# Patient Record
Sex: Male | Born: 1950 | Race: Black or African American | Hispanic: No | Smoking: Never smoker
Health system: Southern US, Community
[De-identification: ages and names within clinical notes are randomized; demographics above are authoritative.]

## PROBLEM LIST (undated history)

## (undated) DIAGNOSIS — E119 Type 2 diabetes mellitus without complications: Secondary | ICD-10-CM

## (undated) DIAGNOSIS — K56609 Unspecified intestinal obstruction, unspecified as to partial versus complete obstruction: Secondary | ICD-10-CM

## (undated) DIAGNOSIS — E785 Hyperlipidemia, unspecified: Secondary | ICD-10-CM

## (undated) DIAGNOSIS — I10 Essential (primary) hypertension: Secondary | ICD-10-CM

## (undated) DIAGNOSIS — N189 Chronic kidney disease, unspecified: Secondary | ICD-10-CM

## (undated) DIAGNOSIS — M069 Rheumatoid arthritis, unspecified: Secondary | ICD-10-CM

## (undated) DIAGNOSIS — K5790 Diverticulosis of intestine, part unspecified, without perforation or abscess without bleeding: Secondary | ICD-10-CM

## (undated) HISTORY — DX: Hyperlipidemia, unspecified: E78.5

## (undated) HISTORY — PX: LEFT COLECTOMY: SHX856

## (undated) HISTORY — DX: Type 2 diabetes mellitus without complications: E11.9

## (undated) HISTORY — DX: Chronic kidney disease, unspecified: N18.9

## (undated) HISTORY — PX: APPENDECTOMY: SHX54

## (undated) HISTORY — PX: COLOSTOMY TAKEDOWN: SHX5783

## (undated) HISTORY — PX: LYSIS OF ADHESION: SHX5961

## (undated) HISTORY — PX: KNEE SURGERY: SHX244

---

## 2010-05-21 ENCOUNTER — Inpatient Hospital Stay (HOSPITAL_COMMUNITY)
Admission: EM | Admit: 2010-05-21 | Discharge: 2010-05-26 | Payer: Self-pay | Source: Home / Self Care | Attending: General Surgery | Admitting: General Surgery

## 2010-05-23 LAB — CBC
HCT: 28.3 % — ABNORMAL LOW (ref 39.0–52.0)
Hemoglobin: 9.7 g/dL — ABNORMAL LOW (ref 13.0–17.0)
MCH: 28.4 pg (ref 26.0–34.0)
MCHC: 34.3 g/dL (ref 30.0–36.0)
MCV: 82.7 fL (ref 78.0–100.0)
Platelets: 192 10*3/uL (ref 150–400)
RBC: 3.42 MIL/uL — ABNORMAL LOW (ref 4.22–5.81)
RDW: 15.4 % (ref 11.5–15.5)
WBC: 13 10*3/uL — ABNORMAL HIGH (ref 4.0–10.5)

## 2010-05-23 LAB — BASIC METABOLIC PANEL
BUN: 10 mg/dL (ref 6–23)
CO2: 22 mEq/L (ref 19–32)
Calcium: 7.6 mg/dL — ABNORMAL LOW (ref 8.4–10.5)
Chloride: 112 mEq/L (ref 96–112)
Creatinine, Ser: 1.14 mg/dL (ref 0.4–1.5)
GFR calc Af Amer: >60 mL/min (ref >60)
GFR calc non Af Amer: >60 mL/min (ref >60)
Glucose, Bld: 79 mg/dL (ref 70–99)
Potassium: 3.7 mEq/L (ref 3.5–5.1)
Sodium: 141 mEq/L (ref 135–145)

## 2010-05-23 LAB — ABO/RH: ABO/RH(D): O POS

## 2010-05-24 LAB — DIFFERENTIAL
Basophils Absolute: 0 10*3/uL (ref 0.0–0.1)
Basophils Absolute: 0 10*3/uL (ref 0.0–0.1)
Basophils Relative: 0 % (ref 0–1)
Basophils Relative: 0 % (ref 0–1)
Eosinophils Absolute: 0.3 10*3/uL (ref 0.0–0.7)
Eosinophils Absolute: 0.4 10*3/uL (ref 0.0–0.7)
Eosinophils Relative: 3 % (ref 0–5)
Eosinophils Relative: 3 % (ref 0–5)
Lymphocytes Relative: 25 % (ref 12–46)
Lymphocytes Relative: 28 % (ref 12–46)
Lymphs Abs: 3.2 10*3/uL (ref 0.7–4.0)
Lymphs Abs: 3.1 10*3/uL (ref 0.7–4.0)
Monocytes Absolute: 0.8 10*3/uL (ref 0.1–1.0)
Monocytes Absolute: 0.7 10*3/uL (ref 0.1–1.0)
Monocytes Relative: 6 % (ref 3–12)
Monocytes Relative: 6 % (ref 3–12)
Neutro Abs: 8.7 10*3/uL — ABNORMAL HIGH (ref 1.7–7.7)
Neutro Abs: 7.2 10*3/uL (ref 1.7–7.7)
Neutrophils Relative %: 67 % (ref 43–77)
Neutrophils Relative %: 63 % (ref 43–77)

## 2010-05-24 LAB — BASIC METABOLIC PANEL
BUN: 4 mg/dL — ABNORMAL LOW (ref 6–23)
CO2: 23 mEq/L (ref 19–32)
Calcium: 8 mg/dL — ABNORMAL LOW (ref 8.4–10.5)
Chloride: 110 mEq/L (ref 96–112)
Creatinine, Ser: 1.19 mg/dL (ref 0.4–1.5)
GFR calc Af Amer: >60 mL/min (ref >60)
GFR calc non Af Amer: >60 mL/min (ref >60)
Glucose, Bld: 80 mg/dL (ref 70–99)
Potassium: 3.7 mEq/L (ref 3.5–5.1)
Sodium: 139 mEq/L (ref 135–145)

## 2010-05-24 LAB — CBC
HCT: 27 % — ABNORMAL LOW (ref 39.0–52.0)
Hemoglobin: 9.3 g/dL — ABNORMAL LOW (ref 13.0–17.0)
MCH: 28.4 pg (ref 26.0–34.0)
MCHC: 34.4 g/dL (ref 30.0–36.0)
MCV: 82.6 fL (ref 78.0–100.0)
Platelets: 184 10*3/uL (ref 150–400)
RBC: 3.27 MIL/uL — ABNORMAL LOW (ref 4.22–5.81)
RDW: 14.8 % (ref 11.5–15.5)
WBC: 11.4 10*3/uL — ABNORMAL HIGH (ref 4.0–10.5)

## 2010-06-04 LAB — CROSSMATCH
ABO/RH(D): O POS
Antibody Screen: NEGATIVE
Unit division: 0
Unit division: 0

## 2010-06-25 NOTE — Discharge Summary (Signed)
  [name], Kevin Lopez NO.:  [id number]  MEDICAL RECORD NO.:  [id number]          PATIENT TYPE:  INP  LOCATION:  A335                          FACILITY:  [facility]  PHYSICIAN:  Barbaraann Barthel, M.D. DATE OF BIRTH:  04-Jul-1950  DATE OF ADMISSION:  05/21/2010 DATE OF DISCHARGE:  01/07/2012LH                              DISCHARGE SUMMARY   DIAGNOSES:  Partial small bowel obstruction.  SECONDARY DIAGNOSES: 1. Arthritis. 2. Asthma. 3. Hypertension.  PROCEDURES:  None other than NG tube decompression.  NOTE:  This is a 60 year old black male who had a 24-hour history of crampy abdominal pain prior to is being seen in the emergency room. Abdominal films showed that he had distended loops of bowel and even a very tender and bloated abdomen with no recent bowel movements or passage of flatus.  An NG tube was inserted and essentially over the subsequent days, he began to pass gas and he felt better and clinically what was thought to be a possibly closed loop obstruction or complete obstruction resolved and at the time of discharge, he was tolerating p.o. well including solid foods and passing gas and having multiple bowel movements.  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  He had 1 previous partial bowel obstruction which resolved nonoperatively approximately 3 years ago and he is essentially in the hospital at this time.  He was also found to have hypertension while he was in the hospital.  DISCHARGE INSTRUCTIONS:  He is to follow up with his own physician who is Dr. Logan Bores and rheumatologist up in [place].  He is told to go to the emergency room should he develop any recurrence of these type of symptoms suggesting bowel obstruction.  He is told to increase his activity as  tolerated.  He is permitted to go up and down the stairs. He is permitted to shower and essentially return to his prehospital activity.  His diet is soft diet and full liquids for the present and I will follow up with him in 2 week's time.  DISCHARGE INSTRUCTIONS:  He is to continue his home medications, his Valium, his hydrocodone as needed, Celebrex, prednisone, Remicade and Elavil as per his physicians in [place].  LABORATORY DATA:  As stated, the patient had a CT scan and multiple acute abdominal series which we followed him.  During his hospitalization, he remained stable.  He has some mild anemia which is likely related to his chronic arthritic conditions and the treatment there off.  On January 4, his H and H was 9.7 and 28.3 and his electrolytes were within normal limits.  His BUN was 10 and his creatinine was 1.14.     Barbaraann Barthel, M.D.     WB/MEDQ  D:  05/26/2010  T:  05/27/2010  Job:  [id number]  Electronically Signed by Barbaraann Barthel M.D. on 06/25/2010 11:09:19 AM

## 2010-06-25 NOTE — Consult Note (Signed)
NAMEMarland Kitchen  Kevin Lopez, Kevin Lopez NO.:  [id number]  MEDICAL RECORD NO.:  [id number]          PATIENT TYPE:  INP  LOCATION:  A335                          FACILITY:  [facility]  PHYSICIAN:  Barbaraann Barthel, M.D. DATE OF BIRTH:  04/05/1951  DATE OF CONSULTATION:  05/21/2010 DATE OF DISCHARGE:                                CONSULTATION   NOTE:  Surgery was asked to see this 60 year old black male for approximately a one 24-hour history of crampy abdominal pain.  HISTORY OF PRESENT MEDICAL ILLNESS:  The patient states that last night, the patient developed some crampy abdominal pain that worsened and he was passing gas and having a bowel movement this morning.  However, he has not had a bowel movement since he has been here this evening.  He has passed a little bit of gas, but he came to the emergency room when he was obviously not improving.  The patient has a history of considerable abdominal surgeries.  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 This required an excision and an resection which was complicated by several surgeries including a colostomy at one point.  The patient has followed this carefully and a year and half ago, he had a colonoscopy and he had no signs of any recurrence.  He did, however, 3 years ago, he was admitted to the hospital with partial small-bowel obstruction which resolved without any surgery.  He is admitted now with those same type of symptoms and after having a CT scan with contrast which showed a small-bowel obstruction in the area likely of the mid or distal ileum.  Other medical problems include history of asthma and a history of arthritis.  He has severe arthritis for which he takes several medications including Remicade which he thinks the dose is 750 mg IV every month.  He also takes Celebrex 200 mg b.i.d.  He also takes for pain some hydrocodone with acetaminophen and Valium  10 mg p.o. hour sleep.  He states that he is allergic to LATEX.  Her other medical problems include asthma, arthritis and he has some hypertension noted on his admission now.  He states that this is a new finding may be related to his pain syndromes at present, we will monitor that.  REVIEW OF SYSTEMS:  GI SYSTEM:  No history of hepatitis.  He does have a history of irritable bowel syndrome and it was treated with medications. He does not recall which ones in [place] and when he was having a lot of loose stools.  He has no history of inflammatory bowel disease, however.  CARDIORESPIRATORY SYSTEM:  The patient is a nonsmoker, but he has a history of asthma.  ENDOCRINE SYSTEM:  No history of diabetes or thyroid disease.  GU SYSTEM:  No history of nephrolithiasis.  However, the patient has had some bladder problems in the past, where he has had some outlet obstruction and he had some pollakiuria which was treated in the past.  He is voiding now without those problems and should he need to have this taken care of, I am going to have  a urologist see him as he states that placing a Foley catheter was problematic in the past.  PHYSICAL EXAMINATION:  GENERAL:  Discloses a pleasant, but obviously acutely uncomfortable 60 year old black male. VITAL SIGNS:  He is 5 feet 9 inches, weighs approximately 160 pounds, his last blood pressure is 197/92 and his pulse rate was 97, his respirations were 23 and his O2 sat is 97% on room air. HEAD:  Normocephalic. EYES:  Extraocular movements are intact.  Pupils were round and react to light and accommodation. MOUTH:  The patient has an NG tube placed.  The patient's mouth is moist. NECK:  His neck is supple and cylindrical without any jugular vein distention, thyromegaly or tracheal deviation or cervical adenopathy and no bruits are auscultated. CHEST:  Clear to both anterior and posterior auscultation. HEART:  Regular rhythm. ABDOMEN:  Somewhat  distended.  He has multiple scars and an incisional hernia there.  His other surgeries have included an appendectomy in the past, bilateral inguinal hernias when he was an infant and as stated those several colon related surgeries. RECTAL:  The patient has guaiac-negative stool. EXTREMITIES:  The patient has had a previous right total knee.  He also bears the signs of obvious osteo or arthritic bony and articular deformities.  LABORATORY DATA:  CT scan as mentioned above.  His white count is 14.2 with an H and H of 11.9 and 33.9, platelet counts 240,000.  He has 80% neutrophilia.  His electrolytes are within normal limits.  His creatinine is 1.21 and a BUN of 16.  Sodium is 137 with a potassium of 4.2 and a chloride of 103 and a bicarb of 24.  Liver function studies are all within normal limits.  IMPRESSION:  Therefore, 60 year old black male with a history of colon cancer who had partial small-bowel obstructions in the past who was admitted now with a small bowel obstruction in the present.  PLAN:  Therefore, admit NG suction.  I have given him Rocephin and we will hydrate him and follow him up with acute abdominal series and follow him for any acute surgical changes.  We would also monitor his blood pressure.     Barbaraann Barthel, M.D.     WB/MEDQ  D:  05/21/2010  T:  05/22/2010  Job:  [id number]  Electronically Signed by Barbaraann Barthel M.D. on 06/25/2010 11:09:09 AM

## 2010-07-30 LAB — BASIC METABOLIC PANEL
BUN: 12 mg/dL (ref 6–23)
BUN: 16 mg/dL (ref 6–23)
CO2: 24 mEq/L (ref 19–32)
CO2: 24 mEq/L (ref 19–32)
Calcium: 8.5 mg/dL (ref 8.4–10.5)
Calcium: 9.7 mg/dL (ref 8.4–10.5)
Chloride: 103 mEq/L (ref 96–112)
Chloride: 108 mEq/L (ref 96–112)
Creatinine, Ser: 1.2 mg/dL (ref 0.4–1.5)
Creatinine, Ser: 1.21 mg/dL (ref 0.4–1.5)
GFR calc Af Amer: >60 mL/min (ref >60)
GFR calc Af Amer: >60 mL/min (ref >60)
GFR calc non Af Amer: >60 mL/min (ref >60)
GFR calc non Af Amer: >60 mL/min (ref >60)
Glucose, Bld: 126 mg/dL — ABNORMAL HIGH (ref 70–99)
Glucose, Bld: 169 mg/dL — ABNORMAL HIGH (ref 70–99)
Potassium: 4.1 mEq/L (ref 3.5–5.1)
Potassium: 4.2 mEq/L (ref 3.5–5.1)
Sodium: 137 mEq/L (ref 135–145)
Sodium: 141 mEq/L (ref 135–145)

## 2010-07-30 LAB — LIPASE, BLOOD: Lipase: 28 U/L (ref 11–59)

## 2010-07-30 LAB — URINALYSIS, ROUTINE W REFLEX MICROSCOPIC
Bilirubin Urine: NEGATIVE
Glucose, UA: NEGATIVE mg/dL
Hgb urine dipstick: NEGATIVE
Ketones, ur: NEGATIVE mg/dL
Leukocytes, UA: NEGATIVE
Nitrite: NEGATIVE
Protein, ur: 100 mg/dL — AB
Specific Gravity, Urine: 1.02 (ref 1.005–1.030)
Urobilinogen, UA: 0.2 mg/dL (ref 0.0–1.0)
pH: 8.5 — ABNORMAL HIGH (ref 5.0–8.0)

## 2010-07-30 LAB — CBC
HCT: 33.9 % — ABNORMAL LOW (ref 39.0–52.0)
HCT: 37.7 % — ABNORMAL LOW (ref 39.0–52.0)
Hemoglobin: 11.9 g/dL — ABNORMAL LOW (ref 13.0–17.0)
Hemoglobin: 13 g/dL (ref 13.0–17.0)
MCH: 28.1 pg (ref 26.0–34.0)
MCH: 28.3 pg (ref 26.0–34.0)
MCHC: 34.5 g/dL (ref 30.0–36.0)
MCHC: 35.1 g/dL (ref 30.0–36.0)
MCV: 80.7 fL (ref 78.0–100.0)
MCV: 81.4 fL (ref 78.0–100.0)
Platelets: 210 10*3/uL (ref 150–400)
Platelets: 240 10*3/uL (ref 150–400)
RBC: 4.2 MIL/uL — ABNORMAL LOW (ref 4.22–5.81)
RBC: 4.63 MIL/uL (ref 4.22–5.81)
RDW: 14.8 % (ref 11.5–15.5)
RDW: 15.1 % (ref 11.5–15.5)
WBC: 14.2 10*3/uL — ABNORMAL HIGH (ref 4.0–10.5)
WBC: 17.3 10*3/uL — ABNORMAL HIGH (ref 4.0–10.5)

## 2010-07-30 LAB — DIFFERENTIAL
Basophils Absolute: 0 10*3/uL (ref 0.0–0.1)
Basophils Absolute: 0 10*3/uL (ref 0.0–0.1)
Basophils Relative: 0 % (ref 0–1)
Basophils Relative: 0 % (ref 0–1)
Eosinophils Absolute: 0.1 10*3/uL (ref 0.0–0.7)
Eosinophils Absolute: 0.1 10*3/uL (ref 0.0–0.7)
Eosinophils Relative: 0 % (ref 0–5)
Eosinophils Relative: 1 % (ref 0–5)
Lymphocytes Relative: 15 % (ref 12–46)
Lymphocytes Relative: 18 % (ref 12–46)
Lymphs Abs: 2.1 10*3/uL (ref 0.7–4.0)
Lymphs Abs: 3.1 10*3/uL (ref 0.7–4.0)
Monocytes Absolute: 0.7 10*3/uL (ref 0.1–1.0)
Monocytes Absolute: 1.3 10*3/uL — ABNORMAL HIGH (ref 0.1–1.0)
Monocytes Relative: 5 % (ref 3–12)
Monocytes Relative: 8 % (ref 3–12)
Neutro Abs: 11.3 10*3/uL — ABNORMAL HIGH (ref 1.7–7.7)
Neutro Abs: 12.8 10*3/uL — ABNORMAL HIGH (ref 1.7–7.7)
Neutrophils Relative %: 74 % (ref 43–77)
Neutrophils Relative %: 80 % — ABNORMAL HIGH (ref 43–77)

## 2010-07-30 LAB — HEPATIC FUNCTION PANEL
ALT: 17 U/L (ref 0–53)
AST: 22 U/L (ref 0–37)
Albumin: 4 g/dL (ref 3.5–5.2)
Alkaline Phosphatase: 37 U/L — ABNORMAL LOW (ref 39–117)
Bilirubin, Direct: 0.1 mg/dL (ref 0.0–0.3)
Indirect Bilirubin: 0.3 mg/dL (ref 0.3–0.9)
Total Bilirubin: 0.4 mg/dL (ref 0.3–1.2)
Total Protein: 7.6 g/dL (ref 6.0–8.3)

## 2010-07-30 LAB — URINE MICROSCOPIC-ADD ON

## 2011-05-28 DIAGNOSIS — M069 Rheumatoid arthritis, unspecified: Secondary | ICD-10-CM | POA: Diagnosis not present

## 2011-05-28 DIAGNOSIS — G542 Cervical root disorders, not elsewhere classified: Secondary | ICD-10-CM | POA: Diagnosis not present

## 2011-06-13 DIAGNOSIS — IMO0002 Reserved for concepts with insufficient information to code with codable children: Secondary | ICD-10-CM | POA: Diagnosis not present

## 2011-06-13 DIAGNOSIS — M069 Rheumatoid arthritis, unspecified: Secondary | ICD-10-CM | POA: Diagnosis not present

## 2011-06-13 DIAGNOSIS — M810 Age-related osteoporosis without current pathological fracture: Secondary | ICD-10-CM | POA: Diagnosis not present

## 2011-06-13 DIAGNOSIS — M1710 Unilateral primary osteoarthritis, unspecified knee: Secondary | ICD-10-CM | POA: Diagnosis not present

## 2011-06-13 DIAGNOSIS — E119 Type 2 diabetes mellitus without complications: Secondary | ICD-10-CM | POA: Diagnosis not present

## 2011-06-13 DIAGNOSIS — M130 Polyarthritis, unspecified: Secondary | ICD-10-CM | POA: Diagnosis not present

## 2011-07-15 DIAGNOSIS — M069 Rheumatoid arthritis, unspecified: Secondary | ICD-10-CM | POA: Diagnosis not present

## 2011-07-15 DIAGNOSIS — M130 Polyarthritis, unspecified: Secondary | ICD-10-CM | POA: Diagnosis not present

## 2011-07-15 DIAGNOSIS — M1710 Unilateral primary osteoarthritis, unspecified knee: Secondary | ICD-10-CM | POA: Diagnosis not present

## 2011-08-01 DIAGNOSIS — N318 Other neuromuscular dysfunction of bladder: Secondary | ICD-10-CM | POA: Diagnosis not present

## 2011-08-01 DIAGNOSIS — E291 Testicular hypofunction: Secondary | ICD-10-CM | POA: Diagnosis not present

## 2011-08-01 DIAGNOSIS — N400 Enlarged prostate without lower urinary tract symptoms: Secondary | ICD-10-CM | POA: Diagnosis not present

## 2011-08-01 DIAGNOSIS — N529 Male erectile dysfunction, unspecified: Secondary | ICD-10-CM | POA: Diagnosis not present

## 2011-08-01 DIAGNOSIS — C61 Malignant neoplasm of prostate: Secondary | ICD-10-CM | POA: Diagnosis not present

## 2011-08-20 DIAGNOSIS — M069 Rheumatoid arthritis, unspecified: Secondary | ICD-10-CM | POA: Diagnosis not present

## 2011-08-20 DIAGNOSIS — M130 Polyarthritis, unspecified: Secondary | ICD-10-CM | POA: Diagnosis not present

## 2011-08-20 DIAGNOSIS — M12569 Traumatic arthropathy, unspecified knee: Secondary | ICD-10-CM | POA: Diagnosis not present

## 2011-09-23 DIAGNOSIS — M12569 Traumatic arthropathy, unspecified knee: Secondary | ICD-10-CM | POA: Diagnosis not present

## 2011-09-23 DIAGNOSIS — M069 Rheumatoid arthritis, unspecified: Secondary | ICD-10-CM | POA: Diagnosis not present

## 2011-09-23 DIAGNOSIS — E119 Type 2 diabetes mellitus without complications: Secondary | ICD-10-CM | POA: Diagnosis not present

## 2011-09-23 DIAGNOSIS — M810 Age-related osteoporosis without current pathological fracture: Secondary | ICD-10-CM | POA: Diagnosis not present

## 2011-09-23 DIAGNOSIS — E785 Hyperlipidemia, unspecified: Secondary | ICD-10-CM | POA: Diagnosis not present

## 2011-10-02 DIAGNOSIS — N318 Other neuromuscular dysfunction of bladder: Secondary | ICD-10-CM | POA: Diagnosis not present

## 2011-10-02 DIAGNOSIS — E291 Testicular hypofunction: Secondary | ICD-10-CM | POA: Diagnosis not present

## 2011-10-02 DIAGNOSIS — N529 Male erectile dysfunction, unspecified: Secondary | ICD-10-CM | POA: Diagnosis not present

## 2011-10-02 DIAGNOSIS — N400 Enlarged prostate without lower urinary tract symptoms: Secondary | ICD-10-CM | POA: Diagnosis not present

## 2011-10-10 DIAGNOSIS — H6120 Impacted cerumen, unspecified ear: Secondary | ICD-10-CM | POA: Diagnosis not present

## 2011-10-10 DIAGNOSIS — J329 Chronic sinusitis, unspecified: Secondary | ICD-10-CM | POA: Diagnosis not present

## 2011-10-10 DIAGNOSIS — J3089 Other allergic rhinitis: Secondary | ICD-10-CM | POA: Diagnosis not present

## 2011-10-10 DIAGNOSIS — H908 Mixed conductive and sensorineural hearing loss, unspecified: Secondary | ICD-10-CM | POA: Diagnosis not present

## 2011-10-16 DIAGNOSIS — J329 Chronic sinusitis, unspecified: Secondary | ICD-10-CM | POA: Diagnosis not present

## 2011-10-18 DIAGNOSIS — N318 Other neuromuscular dysfunction of bladder: Secondary | ICD-10-CM | POA: Diagnosis not present

## 2011-10-18 DIAGNOSIS — J3089 Other allergic rhinitis: Secondary | ICD-10-CM | POA: Diagnosis not present

## 2011-10-18 DIAGNOSIS — N3010 Interstitial cystitis (chronic) without hematuria: Secondary | ICD-10-CM | POA: Diagnosis not present

## 2011-10-18 DIAGNOSIS — H608X9 Other otitis externa, unspecified ear: Secondary | ICD-10-CM | POA: Diagnosis not present

## 2011-10-18 DIAGNOSIS — N529 Male erectile dysfunction, unspecified: Secondary | ICD-10-CM | POA: Diagnosis not present

## 2011-10-18 DIAGNOSIS — E291 Testicular hypofunction: Secondary | ICD-10-CM | POA: Diagnosis not present

## 2011-10-18 DIAGNOSIS — H9209 Otalgia, unspecified ear: Secondary | ICD-10-CM | POA: Diagnosis not present

## 2011-10-21 DIAGNOSIS — M130 Polyarthritis, unspecified: Secondary | ICD-10-CM | POA: Diagnosis not present

## 2011-10-21 DIAGNOSIS — M1710 Unilateral primary osteoarthritis, unspecified knee: Secondary | ICD-10-CM | POA: Diagnosis not present

## 2011-10-21 DIAGNOSIS — M069 Rheumatoid arthritis, unspecified: Secondary | ICD-10-CM | POA: Diagnosis not present

## 2011-10-22 DIAGNOSIS — M130 Polyarthritis, unspecified: Secondary | ICD-10-CM | POA: Diagnosis not present

## 2011-10-22 DIAGNOSIS — M069 Rheumatoid arthritis, unspecified: Secondary | ICD-10-CM | POA: Diagnosis not present

## 2011-10-22 DIAGNOSIS — M1710 Unilateral primary osteoarthritis, unspecified knee: Secondary | ICD-10-CM | POA: Diagnosis not present

## 2011-10-26 DIAGNOSIS — M549 Dorsalgia, unspecified: Secondary | ICD-10-CM | POA: Diagnosis not present

## 2011-10-26 DIAGNOSIS — M48061 Spinal stenosis, lumbar region without neurogenic claudication: Secondary | ICD-10-CM | POA: Diagnosis not present

## 2011-10-29 DIAGNOSIS — J320 Chronic maxillary sinusitis: Secondary | ICD-10-CM | POA: Diagnosis not present

## 2011-10-29 DIAGNOSIS — H9209 Otalgia, unspecified ear: Secondary | ICD-10-CM | POA: Diagnosis not present

## 2011-10-29 DIAGNOSIS — J3089 Other allergic rhinitis: Secondary | ICD-10-CM | POA: Diagnosis not present

## 2011-10-30 DIAGNOSIS — M1710 Unilateral primary osteoarthritis, unspecified knee: Secondary | ICD-10-CM | POA: Diagnosis not present

## 2011-10-30 DIAGNOSIS — M949 Disorder of cartilage, unspecified: Secondary | ICD-10-CM | POA: Diagnosis not present

## 2011-10-30 DIAGNOSIS — M069 Rheumatoid arthritis, unspecified: Secondary | ICD-10-CM | POA: Diagnosis not present

## 2011-10-30 DIAGNOSIS — M899 Disorder of bone, unspecified: Secondary | ICD-10-CM | POA: Diagnosis not present

## 2011-11-19 DIAGNOSIS — E559 Vitamin D deficiency, unspecified: Secondary | ICD-10-CM | POA: Diagnosis not present

## 2011-11-19 DIAGNOSIS — M069 Rheumatoid arthritis, unspecified: Secondary | ICD-10-CM | POA: Diagnosis not present

## 2011-11-19 DIAGNOSIS — J309 Allergic rhinitis, unspecified: Secondary | ICD-10-CM | POA: Diagnosis not present

## 2011-11-19 DIAGNOSIS — N400 Enlarged prostate without lower urinary tract symptoms: Secondary | ICD-10-CM | POA: Diagnosis not present

## 2011-11-19 DIAGNOSIS — M130 Polyarthritis, unspecified: Secondary | ICD-10-CM | POA: Diagnosis not present

## 2011-11-19 DIAGNOSIS — J019 Acute sinusitis, unspecified: Secondary | ICD-10-CM | POA: Diagnosis not present

## 2011-11-19 DIAGNOSIS — Z01818 Encounter for other preprocedural examination: Secondary | ICD-10-CM | POA: Diagnosis not present

## 2011-11-19 DIAGNOSIS — I10 Essential (primary) hypertension: Secondary | ICD-10-CM | POA: Diagnosis not present

## 2011-11-19 DIAGNOSIS — E782 Mixed hyperlipidemia: Secondary | ICD-10-CM | POA: Diagnosis not present

## 2011-11-20 DIAGNOSIS — R0602 Shortness of breath: Secondary | ICD-10-CM | POA: Diagnosis not present

## 2011-11-20 DIAGNOSIS — R079 Chest pain, unspecified: Secondary | ICD-10-CM | POA: Diagnosis not present

## 2011-11-25 DIAGNOSIS — I70229 Atherosclerosis of native arteries of extremities with rest pain, unspecified extremity: Secondary | ICD-10-CM | POA: Diagnosis not present

## 2011-11-25 DIAGNOSIS — I739 Peripheral vascular disease, unspecified: Secondary | ICD-10-CM | POA: Diagnosis not present

## 2011-11-25 DIAGNOSIS — R269 Unspecified abnormalities of gait and mobility: Secondary | ICD-10-CM | POA: Diagnosis not present

## 2011-11-25 DIAGNOSIS — J3489 Other specified disorders of nose and nasal sinuses: Secondary | ICD-10-CM | POA: Diagnosis not present

## 2011-11-25 DIAGNOSIS — B353 Tinea pedis: Secondary | ICD-10-CM | POA: Diagnosis not present

## 2011-11-25 DIAGNOSIS — M2040 Other hammer toe(s) (acquired), unspecified foot: Secondary | ICD-10-CM | POA: Diagnosis not present

## 2011-11-25 DIAGNOSIS — I672 Cerebral atherosclerosis: Secondary | ICD-10-CM | POA: Diagnosis not present

## 2011-11-25 DIAGNOSIS — J342 Deviated nasal septum: Secondary | ICD-10-CM | POA: Diagnosis not present

## 2011-11-25 DIAGNOSIS — J343 Hypertrophy of nasal turbinates: Secondary | ICD-10-CM | POA: Diagnosis not present

## 2011-12-27 DIAGNOSIS — L738 Other specified follicular disorders: Secondary | ICD-10-CM | POA: Diagnosis not present

## 2011-12-27 DIAGNOSIS — B360 Pityriasis versicolor: Secondary | ICD-10-CM | POA: Diagnosis not present

## 2011-12-30 DIAGNOSIS — M069 Rheumatoid arthritis, unspecified: Secondary | ICD-10-CM | POA: Diagnosis not present

## 2011-12-30 DIAGNOSIS — M810 Age-related osteoporosis without current pathological fracture: Secondary | ICD-10-CM | POA: Diagnosis not present

## 2011-12-30 DIAGNOSIS — E785 Hyperlipidemia, unspecified: Secondary | ICD-10-CM | POA: Diagnosis not present

## 2011-12-30 DIAGNOSIS — IMO0002 Reserved for concepts with insufficient information to code with codable children: Secondary | ICD-10-CM | POA: Diagnosis not present

## 2011-12-30 DIAGNOSIS — M1710 Unilateral primary osteoarthritis, unspecified knee: Secondary | ICD-10-CM | POA: Diagnosis not present

## 2011-12-30 DIAGNOSIS — E119 Type 2 diabetes mellitus without complications: Secondary | ICD-10-CM | POA: Diagnosis not present

## 2012-03-06 DIAGNOSIS — M1990 Unspecified osteoarthritis, unspecified site: Secondary | ICD-10-CM | POA: Diagnosis not present

## 2012-03-06 DIAGNOSIS — R5381 Other malaise: Secondary | ICD-10-CM | POA: Diagnosis not present

## 2012-03-06 DIAGNOSIS — R079 Chest pain, unspecified: Secondary | ICD-10-CM | POA: Diagnosis not present

## 2012-03-06 DIAGNOSIS — M069 Rheumatoid arthritis, unspecified: Secondary | ICD-10-CM | POA: Diagnosis not present

## 2012-03-10 DIAGNOSIS — E559 Vitamin D deficiency, unspecified: Secondary | ICD-10-CM | POA: Diagnosis not present

## 2012-03-10 DIAGNOSIS — E039 Hypothyroidism, unspecified: Secondary | ICD-10-CM | POA: Diagnosis not present

## 2012-03-10 DIAGNOSIS — Z0000 Encounter for general adult medical examination without abnormal findings: Secondary | ICD-10-CM | POA: Diagnosis not present

## 2012-03-10 DIAGNOSIS — I10 Essential (primary) hypertension: Secondary | ICD-10-CM | POA: Diagnosis not present

## 2012-03-10 DIAGNOSIS — Z23 Encounter for immunization: Secondary | ICD-10-CM | POA: Diagnosis not present

## 2012-03-10 DIAGNOSIS — E7800 Pure hypercholesterolemia, unspecified: Secondary | ICD-10-CM | POA: Diagnosis not present

## 2012-03-10 DIAGNOSIS — N400 Enlarged prostate without lower urinary tract symptoms: Secondary | ICD-10-CM | POA: Diagnosis not present

## 2012-03-10 DIAGNOSIS — N390 Urinary tract infection, site not specified: Secondary | ICD-10-CM | POA: Diagnosis not present

## 2012-03-10 DIAGNOSIS — R7309 Other abnormal glucose: Secondary | ICD-10-CM | POA: Diagnosis not present

## 2012-03-10 DIAGNOSIS — Z202 Contact with and (suspected) exposure to infections with a predominantly sexual mode of transmission: Secondary | ICD-10-CM | POA: Diagnosis not present

## 2012-03-10 DIAGNOSIS — E119 Type 2 diabetes mellitus without complications: Secondary | ICD-10-CM | POA: Diagnosis not present

## 2012-03-13 DIAGNOSIS — M069 Rheumatoid arthritis, unspecified: Secondary | ICD-10-CM | POA: Diagnosis not present

## 2012-03-13 DIAGNOSIS — I10 Essential (primary) hypertension: Secondary | ICD-10-CM | POA: Diagnosis not present

## 2012-03-19 DIAGNOSIS — N289 Disorder of kidney and ureter, unspecified: Secondary | ICD-10-CM | POA: Diagnosis not present

## 2012-03-19 DIAGNOSIS — I10 Essential (primary) hypertension: Secondary | ICD-10-CM | POA: Diagnosis not present

## 2012-03-19 DIAGNOSIS — R7309 Other abnormal glucose: Secondary | ICD-10-CM | POA: Diagnosis not present

## 2012-03-19 DIAGNOSIS — R079 Chest pain, unspecified: Secondary | ICD-10-CM | POA: Diagnosis not present

## 2012-03-19 DIAGNOSIS — R011 Cardiac murmur, unspecified: Secondary | ICD-10-CM | POA: Diagnosis not present

## 2012-03-19 DIAGNOSIS — R0602 Shortness of breath: Secondary | ICD-10-CM | POA: Diagnosis not present

## 2012-03-19 DIAGNOSIS — T7840XA Allergy, unspecified, initial encounter: Secondary | ICD-10-CM | POA: Diagnosis not present

## 2012-04-17 DIAGNOSIS — N138 Other obstructive and reflux uropathy: Secondary | ICD-10-CM | POA: Diagnosis not present

## 2012-04-17 DIAGNOSIS — N401 Enlarged prostate with lower urinary tract symptoms: Secondary | ICD-10-CM | POA: Diagnosis not present

## 2012-04-17 DIAGNOSIS — N400 Enlarged prostate without lower urinary tract symptoms: Secondary | ICD-10-CM | POA: Diagnosis not present

## 2012-04-17 DIAGNOSIS — N529 Male erectile dysfunction, unspecified: Secondary | ICD-10-CM | POA: Diagnosis not present

## 2012-04-17 DIAGNOSIS — N318 Other neuromuscular dysfunction of bladder: Secondary | ICD-10-CM | POA: Diagnosis not present

## 2012-04-17 DIAGNOSIS — N3010 Interstitial cystitis (chronic) without hematuria: Secondary | ICD-10-CM | POA: Diagnosis not present

## 2012-04-17 DIAGNOSIS — E291 Testicular hypofunction: Secondary | ICD-10-CM | POA: Diagnosis not present

## 2012-04-22 ENCOUNTER — Telehealth: Payer: Self-pay

## 2012-04-22 DIAGNOSIS — R209 Unspecified disturbances of skin sensation: Secondary | ICD-10-CM | POA: Diagnosis not present

## 2012-04-22 DIAGNOSIS — M79609 Pain in unspecified limb: Secondary | ICD-10-CM | POA: Diagnosis not present

## 2012-04-22 DIAGNOSIS — M542 Cervicalgia: Secondary | ICD-10-CM | POA: Diagnosis not present

## 2012-04-22 DIAGNOSIS — G609 Hereditary and idiopathic neuropathy, unspecified: Secondary | ICD-10-CM | POA: Diagnosis not present

## 2012-04-22 DIAGNOSIS — M5412 Radiculopathy, cervical region: Secondary | ICD-10-CM | POA: Diagnosis not present

## 2012-04-22 DIAGNOSIS — M6281 Muscle weakness (generalized): Secondary | ICD-10-CM | POA: Diagnosis not present

## 2012-04-22 NOTE — Telephone Encounter (Signed)
error 

## 2012-04-23 DIAGNOSIS — R9431 Abnormal electrocardiogram [ECG] [EKG]: Secondary | ICD-10-CM | POA: Diagnosis not present

## 2012-04-23 DIAGNOSIS — R7309 Other abnormal glucose: Secondary | ICD-10-CM | POA: Diagnosis not present

## 2012-04-23 DIAGNOSIS — R5383 Other fatigue: Secondary | ICD-10-CM | POA: Diagnosis not present

## 2012-04-23 DIAGNOSIS — N289 Disorder of kidney and ureter, unspecified: Secondary | ICD-10-CM | POA: Diagnosis not present

## 2012-04-23 DIAGNOSIS — J45909 Unspecified asthma, uncomplicated: Secondary | ICD-10-CM | POA: Diagnosis not present

## 2012-04-23 DIAGNOSIS — I10 Essential (primary) hypertension: Secondary | ICD-10-CM | POA: Diagnosis not present

## 2012-04-23 DIAGNOSIS — R5381 Other malaise: Secondary | ICD-10-CM | POA: Diagnosis not present

## 2012-04-23 DIAGNOSIS — I200 Unstable angina: Secondary | ICD-10-CM | POA: Diagnosis not present

## 2012-04-23 DIAGNOSIS — E785 Hyperlipidemia, unspecified: Secondary | ICD-10-CM | POA: Diagnosis not present

## 2012-06-08 DIAGNOSIS — M1710 Unilateral primary osteoarthritis, unspecified knee: Secondary | ICD-10-CM | POA: Diagnosis not present

## 2012-06-08 DIAGNOSIS — E291 Testicular hypofunction: Secondary | ICD-10-CM | POA: Diagnosis not present

## 2012-06-08 DIAGNOSIS — M810 Age-related osteoporosis without current pathological fracture: Secondary | ICD-10-CM | POA: Diagnosis not present

## 2012-06-08 DIAGNOSIS — N400 Enlarged prostate without lower urinary tract symptoms: Secondary | ICD-10-CM | POA: Diagnosis not present

## 2012-06-08 DIAGNOSIS — M069 Rheumatoid arthritis, unspecified: Secondary | ICD-10-CM | POA: Diagnosis not present

## 2012-06-08 DIAGNOSIS — I10 Essential (primary) hypertension: Secondary | ICD-10-CM | POA: Diagnosis not present

## 2012-06-08 DIAGNOSIS — N3010 Interstitial cystitis (chronic) without hematuria: Secondary | ICD-10-CM | POA: Diagnosis not present

## 2012-06-08 DIAGNOSIS — E119 Type 2 diabetes mellitus without complications: Secondary | ICD-10-CM | POA: Diagnosis not present

## 2012-06-08 DIAGNOSIS — E785 Hyperlipidemia, unspecified: Secondary | ICD-10-CM | POA: Diagnosis not present

## 2012-06-08 DIAGNOSIS — IMO0002 Reserved for concepts with insufficient information to code with codable children: Secondary | ICD-10-CM | POA: Diagnosis not present

## 2012-06-08 DIAGNOSIS — N529 Male erectile dysfunction, unspecified: Secondary | ICD-10-CM | POA: Diagnosis not present

## 2012-09-08 DIAGNOSIS — G4700 Insomnia, unspecified: Secondary | ICD-10-CM | POA: Diagnosis not present

## 2012-09-08 DIAGNOSIS — E119 Type 2 diabetes mellitus without complications: Secondary | ICD-10-CM | POA: Diagnosis not present

## 2012-09-08 DIAGNOSIS — M810 Age-related osteoporosis without current pathological fracture: Secondary | ICD-10-CM | POA: Diagnosis not present

## 2012-09-08 DIAGNOSIS — H1010 Acute atopic conjunctivitis, unspecified eye: Secondary | ICD-10-CM | POA: Diagnosis not present

## 2012-09-08 DIAGNOSIS — M069 Rheumatoid arthritis, unspecified: Secondary | ICD-10-CM | POA: Diagnosis not present

## 2012-09-08 DIAGNOSIS — M779 Enthesopathy, unspecified: Secondary | ICD-10-CM | POA: Diagnosis not present

## 2012-09-08 DIAGNOSIS — IMO0002 Reserved for concepts with insufficient information to code with codable children: Secondary | ICD-10-CM | POA: Diagnosis not present

## 2012-09-08 DIAGNOSIS — M25549 Pain in joints of unspecified hand: Secondary | ICD-10-CM | POA: Diagnosis not present

## 2012-09-08 DIAGNOSIS — I10 Essential (primary) hypertension: Secondary | ICD-10-CM | POA: Diagnosis not present

## 2012-09-08 DIAGNOSIS — M1710 Unilateral primary osteoarthritis, unspecified knee: Secondary | ICD-10-CM | POA: Diagnosis not present

## 2012-09-10 DIAGNOSIS — L259 Unspecified contact dermatitis, unspecified cause: Secondary | ICD-10-CM | POA: Diagnosis not present

## 2012-09-10 DIAGNOSIS — H04129 Dry eye syndrome of unspecified lacrimal gland: Secondary | ICD-10-CM | POA: Diagnosis not present

## 2012-09-10 DIAGNOSIS — H2510 Age-related nuclear cataract, unspecified eye: Secondary | ICD-10-CM | POA: Diagnosis not present

## 2012-09-18 DIAGNOSIS — H2510 Age-related nuclear cataract, unspecified eye: Secondary | ICD-10-CM | POA: Diagnosis not present

## 2012-09-18 DIAGNOSIS — L259 Unspecified contact dermatitis, unspecified cause: Secondary | ICD-10-CM | POA: Diagnosis not present

## 2012-09-18 DIAGNOSIS — H04129 Dry eye syndrome of unspecified lacrimal gland: Secondary | ICD-10-CM | POA: Diagnosis not present

## 2012-11-12 DIAGNOSIS — E785 Hyperlipidemia, unspecified: Secondary | ICD-10-CM | POA: Diagnosis not present

## 2012-11-12 DIAGNOSIS — M810 Age-related osteoporosis without current pathological fracture: Secondary | ICD-10-CM | POA: Diagnosis not present

## 2012-11-12 DIAGNOSIS — E119 Type 2 diabetes mellitus without complications: Secondary | ICD-10-CM | POA: Diagnosis not present

## 2012-11-12 DIAGNOSIS — M069 Rheumatoid arthritis, unspecified: Secondary | ICD-10-CM | POA: Diagnosis not present

## 2012-11-13 DIAGNOSIS — T7840XA Allergy, unspecified, initial encounter: Secondary | ICD-10-CM | POA: Diagnosis not present

## 2012-11-13 DIAGNOSIS — R5383 Other fatigue: Secondary | ICD-10-CM | POA: Diagnosis not present

## 2012-11-13 DIAGNOSIS — R5381 Other malaise: Secondary | ICD-10-CM | POA: Diagnosis not present

## 2012-11-13 DIAGNOSIS — E7800 Pure hypercholesterolemia, unspecified: Secondary | ICD-10-CM | POA: Diagnosis not present

## 2012-11-13 DIAGNOSIS — N419 Inflammatory disease of prostate, unspecified: Secondary | ICD-10-CM | POA: Diagnosis not present

## 2012-11-13 DIAGNOSIS — N529 Male erectile dysfunction, unspecified: Secondary | ICD-10-CM | POA: Diagnosis not present

## 2012-11-13 DIAGNOSIS — R51 Headache: Secondary | ICD-10-CM | POA: Diagnosis not present

## 2012-11-13 DIAGNOSIS — G90519 Complex regional pain syndrome I of unspecified upper limb: Secondary | ICD-10-CM | POA: Diagnosis not present

## 2012-11-13 DIAGNOSIS — G90529 Complex regional pain syndrome I of unspecified lower limb: Secondary | ICD-10-CM | POA: Diagnosis not present

## 2012-11-13 DIAGNOSIS — G609 Hereditary and idiopathic neuropathy, unspecified: Secondary | ICD-10-CM | POA: Diagnosis not present

## 2012-11-13 DIAGNOSIS — N400 Enlarged prostate without lower urinary tract symptoms: Secondary | ICD-10-CM | POA: Diagnosis not present

## 2012-11-13 DIAGNOSIS — J45909 Unspecified asthma, uncomplicated: Secondary | ICD-10-CM | POA: Diagnosis not present

## 2012-11-13 DIAGNOSIS — J309 Allergic rhinitis, unspecified: Secondary | ICD-10-CM | POA: Diagnosis not present

## 2012-11-13 DIAGNOSIS — N3010 Interstitial cystitis (chronic) without hematuria: Secondary | ICD-10-CM | POA: Diagnosis not present

## 2012-11-13 DIAGNOSIS — M1990 Unspecified osteoarthritis, unspecified site: Secondary | ICD-10-CM | POA: Diagnosis not present

## 2012-11-13 DIAGNOSIS — I10 Essential (primary) hypertension: Secondary | ICD-10-CM | POA: Diagnosis not present

## 2012-11-13 DIAGNOSIS — E291 Testicular hypofunction: Secondary | ICD-10-CM | POA: Diagnosis not present

## 2013-01-08 DIAGNOSIS — M1710 Unilateral primary osteoarthritis, unspecified knee: Secondary | ICD-10-CM | POA: Diagnosis not present

## 2013-01-08 DIAGNOSIS — E785 Hyperlipidemia, unspecified: Secondary | ICD-10-CM | POA: Diagnosis not present

## 2013-01-08 DIAGNOSIS — M069 Rheumatoid arthritis, unspecified: Secondary | ICD-10-CM | POA: Diagnosis not present

## 2013-01-08 DIAGNOSIS — M6530 Trigger finger, unspecified finger: Secondary | ICD-10-CM | POA: Diagnosis not present

## 2013-01-08 DIAGNOSIS — M810 Age-related osteoporosis without current pathological fracture: Secondary | ICD-10-CM | POA: Diagnosis not present

## 2013-01-08 DIAGNOSIS — IMO0002 Reserved for concepts with insufficient information to code with codable children: Secondary | ICD-10-CM | POA: Diagnosis not present

## 2013-01-08 DIAGNOSIS — E119 Type 2 diabetes mellitus without complications: Secondary | ICD-10-CM | POA: Diagnosis not present

## 2013-01-08 DIAGNOSIS — M779 Enthesopathy, unspecified: Secondary | ICD-10-CM | POA: Diagnosis not present

## 2013-02-25 DIAGNOSIS — E236 Other disorders of pituitary gland: Secondary | ICD-10-CM | POA: Diagnosis not present

## 2013-02-25 DIAGNOSIS — N401 Enlarged prostate with lower urinary tract symptoms: Secondary | ICD-10-CM | POA: Diagnosis not present

## 2013-02-25 DIAGNOSIS — N529 Male erectile dysfunction, unspecified: Secondary | ICD-10-CM | POA: Diagnosis not present

## 2013-03-08 DIAGNOSIS — E785 Hyperlipidemia, unspecified: Secondary | ICD-10-CM | POA: Diagnosis not present

## 2013-03-08 DIAGNOSIS — M2550 Pain in unspecified joint: Secondary | ICD-10-CM | POA: Diagnosis not present

## 2013-03-08 DIAGNOSIS — E119 Type 2 diabetes mellitus without complications: Secondary | ICD-10-CM | POA: Diagnosis not present

## 2013-03-08 DIAGNOSIS — M1710 Unilateral primary osteoarthritis, unspecified knee: Secondary | ICD-10-CM | POA: Diagnosis not present

## 2013-03-08 DIAGNOSIS — IMO0002 Reserved for concepts with insufficient information to code with codable children: Secondary | ICD-10-CM | POA: Diagnosis not present

## 2013-03-08 DIAGNOSIS — E559 Vitamin D deficiency, unspecified: Secondary | ICD-10-CM | POA: Diagnosis not present

## 2013-03-08 DIAGNOSIS — M069 Rheumatoid arthritis, unspecified: Secondary | ICD-10-CM | POA: Diagnosis not present

## 2013-03-08 DIAGNOSIS — M810 Age-related osteoporosis without current pathological fracture: Secondary | ICD-10-CM | POA: Diagnosis not present

## 2013-03-09 DIAGNOSIS — M1990 Unspecified osteoarthritis, unspecified site: Secondary | ICD-10-CM | POA: Diagnosis not present

## 2013-03-09 DIAGNOSIS — N390 Urinary tract infection, site not specified: Secondary | ICD-10-CM | POA: Diagnosis not present

## 2013-03-09 DIAGNOSIS — E039 Hypothyroidism, unspecified: Secondary | ICD-10-CM | POA: Diagnosis not present

## 2013-03-09 DIAGNOSIS — M069 Rheumatoid arthritis, unspecified: Secondary | ICD-10-CM | POA: Diagnosis not present

## 2013-03-09 DIAGNOSIS — R21 Rash and other nonspecific skin eruption: Secondary | ICD-10-CM | POA: Diagnosis not present

## 2013-03-09 DIAGNOSIS — E7800 Pure hypercholesterolemia, unspecified: Secondary | ICD-10-CM | POA: Diagnosis not present

## 2013-03-09 DIAGNOSIS — E119 Type 2 diabetes mellitus without complications: Secondary | ICD-10-CM | POA: Diagnosis not present

## 2013-03-09 DIAGNOSIS — L299 Pruritus, unspecified: Secondary | ICD-10-CM | POA: Diagnosis not present

## 2013-03-16 DIAGNOSIS — Z23 Encounter for immunization: Secondary | ICD-10-CM | POA: Diagnosis not present

## 2013-03-16 DIAGNOSIS — M069 Rheumatoid arthritis, unspecified: Secondary | ICD-10-CM | POA: Diagnosis not present

## 2013-03-16 DIAGNOSIS — E119 Type 2 diabetes mellitus without complications: Secondary | ICD-10-CM | POA: Diagnosis not present

## 2013-03-16 DIAGNOSIS — M1990 Unspecified osteoarthritis, unspecified site: Secondary | ICD-10-CM | POA: Diagnosis not present

## 2013-03-16 DIAGNOSIS — N289 Disorder of kidney and ureter, unspecified: Secondary | ICD-10-CM | POA: Diagnosis not present

## 2013-03-18 DIAGNOSIS — I10 Essential (primary) hypertension: Secondary | ICD-10-CM | POA: Diagnosis not present

## 2013-03-18 DIAGNOSIS — M109 Gout, unspecified: Secondary | ICD-10-CM | POA: Diagnosis not present

## 2013-03-18 DIAGNOSIS — N159 Renal tubulo-interstitial disease, unspecified: Secondary | ICD-10-CM | POA: Diagnosis not present

## 2013-03-18 DIAGNOSIS — R809 Proteinuria, unspecified: Secondary | ICD-10-CM | POA: Diagnosis not present

## 2013-04-08 ENCOUNTER — Ambulatory Visit: Payer: Medicare Other | Admitting: Gastroenterology

## 2013-04-08 ENCOUNTER — Encounter (INDEPENDENT_AMBULATORY_CARE_PROVIDER_SITE_OTHER): Payer: Self-pay

## 2013-06-16 DIAGNOSIS — M1710 Unilateral primary osteoarthritis, unspecified knee: Secondary | ICD-10-CM | POA: Diagnosis not present

## 2013-06-16 DIAGNOSIS — E119 Type 2 diabetes mellitus without complications: Secondary | ICD-10-CM | POA: Diagnosis not present

## 2013-06-16 DIAGNOSIS — M810 Age-related osteoporosis without current pathological fracture: Secondary | ICD-10-CM | POA: Diagnosis not present

## 2013-06-16 DIAGNOSIS — M069 Rheumatoid arthritis, unspecified: Secondary | ICD-10-CM | POA: Diagnosis not present

## 2013-06-16 DIAGNOSIS — M2550 Pain in unspecified joint: Secondary | ICD-10-CM | POA: Diagnosis not present

## 2013-06-16 DIAGNOSIS — IMO0002 Reserved for concepts with insufficient information to code with codable children: Secondary | ICD-10-CM | POA: Diagnosis not present

## 2013-07-12 DIAGNOSIS — M2550 Pain in unspecified joint: Secondary | ICD-10-CM | POA: Diagnosis not present

## 2013-07-12 DIAGNOSIS — N3941 Urge incontinence: Secondary | ICD-10-CM | POA: Diagnosis not present

## 2013-07-12 DIAGNOSIS — M069 Rheumatoid arthritis, unspecified: Secondary | ICD-10-CM | POA: Diagnosis not present

## 2013-07-12 DIAGNOSIS — N401 Enlarged prostate with lower urinary tract symptoms: Secondary | ICD-10-CM | POA: Diagnosis not present

## 2013-07-12 DIAGNOSIS — R351 Nocturia: Secondary | ICD-10-CM | POA: Diagnosis not present

## 2013-07-12 DIAGNOSIS — R350 Frequency of micturition: Secondary | ICD-10-CM | POA: Diagnosis not present

## 2013-07-12 DIAGNOSIS — E559 Vitamin D deficiency, unspecified: Secondary | ICD-10-CM | POA: Diagnosis not present

## 2013-07-12 DIAGNOSIS — M1710 Unilateral primary osteoarthritis, unspecified knee: Secondary | ICD-10-CM | POA: Diagnosis not present

## 2013-07-13 DIAGNOSIS — M069 Rheumatoid arthritis, unspecified: Secondary | ICD-10-CM | POA: Diagnosis not present

## 2013-07-13 DIAGNOSIS — G4700 Insomnia, unspecified: Secondary | ICD-10-CM | POA: Diagnosis not present

## 2013-07-13 DIAGNOSIS — J45909 Unspecified asthma, uncomplicated: Secondary | ICD-10-CM | POA: Diagnosis not present

## 2013-07-13 DIAGNOSIS — E7800 Pure hypercholesterolemia, unspecified: Secondary | ICD-10-CM | POA: Diagnosis not present

## 2013-07-13 DIAGNOSIS — E119 Type 2 diabetes mellitus without complications: Secondary | ICD-10-CM | POA: Diagnosis not present

## 2013-07-13 DIAGNOSIS — I10 Essential (primary) hypertension: Secondary | ICD-10-CM | POA: Diagnosis not present

## 2013-09-24 DIAGNOSIS — Z202 Contact with and (suspected) exposure to infections with a predominantly sexual mode of transmission: Secondary | ICD-10-CM | POA: Diagnosis not present

## 2013-09-24 DIAGNOSIS — M1990 Unspecified osteoarthritis, unspecified site: Secondary | ICD-10-CM | POA: Diagnosis not present

## 2013-09-24 DIAGNOSIS — IMO0001 Reserved for inherently not codable concepts without codable children: Secondary | ICD-10-CM | POA: Diagnosis not present

## 2013-09-24 DIAGNOSIS — I10 Essential (primary) hypertension: Secondary | ICD-10-CM | POA: Diagnosis not present

## 2013-09-24 DIAGNOSIS — G4730 Sleep apnea, unspecified: Secondary | ICD-10-CM | POA: Diagnosis not present

## 2013-09-24 DIAGNOSIS — D126 Benign neoplasm of colon, unspecified: Secondary | ICD-10-CM | POA: Diagnosis not present

## 2013-09-24 DIAGNOSIS — E039 Hypothyroidism, unspecified: Secondary | ICD-10-CM | POA: Diagnosis not present

## 2013-09-24 DIAGNOSIS — R5381 Other malaise: Secondary | ICD-10-CM | POA: Diagnosis not present

## 2013-09-24 DIAGNOSIS — N159 Renal tubulo-interstitial disease, unspecified: Secondary | ICD-10-CM | POA: Diagnosis not present

## 2013-09-24 DIAGNOSIS — D649 Anemia, unspecified: Secondary | ICD-10-CM | POA: Diagnosis not present

## 2013-09-24 DIAGNOSIS — J309 Allergic rhinitis, unspecified: Secondary | ICD-10-CM | POA: Diagnosis not present

## 2013-09-24 DIAGNOSIS — R5383 Other fatigue: Secondary | ICD-10-CM | POA: Diagnosis not present

## 2013-09-24 DIAGNOSIS — Z23 Encounter for immunization: Secondary | ICD-10-CM | POA: Diagnosis not present

## 2013-09-27 DIAGNOSIS — G4733 Obstructive sleep apnea (adult) (pediatric): Secondary | ICD-10-CM | POA: Diagnosis not present

## 2013-09-28 DIAGNOSIS — D485 Neoplasm of uncertain behavior of skin: Secondary | ICD-10-CM | POA: Diagnosis not present

## 2013-09-28 DIAGNOSIS — Q233 Congenital mitral insufficiency: Secondary | ICD-10-CM | POA: Diagnosis not present

## 2013-09-28 DIAGNOSIS — L678 Other hair color and hair shaft abnormalities: Secondary | ICD-10-CM | POA: Diagnosis not present

## 2013-09-28 DIAGNOSIS — I369 Nonrheumatic tricuspid valve disorder, unspecified: Secondary | ICD-10-CM | POA: Diagnosis not present

## 2013-09-28 DIAGNOSIS — I119 Hypertensive heart disease without heart failure: Secondary | ICD-10-CM | POA: Diagnosis not present

## 2013-09-28 DIAGNOSIS — M25549 Pain in joints of unspecified hand: Secondary | ICD-10-CM | POA: Diagnosis not present

## 2013-09-28 DIAGNOSIS — L738 Other specified follicular disorders: Secondary | ICD-10-CM | POA: Diagnosis not present

## 2013-09-28 DIAGNOSIS — E785 Hyperlipidemia, unspecified: Secondary | ICD-10-CM | POA: Diagnosis not present

## 2013-09-28 DIAGNOSIS — M810 Age-related osteoporosis without current pathological fracture: Secondary | ICD-10-CM | POA: Diagnosis not present

## 2013-09-28 DIAGNOSIS — R269 Unspecified abnormalities of gait and mobility: Secondary | ICD-10-CM | POA: Diagnosis not present

## 2013-09-28 DIAGNOSIS — E559 Vitamin D deficiency, unspecified: Secondary | ICD-10-CM | POA: Diagnosis not present

## 2013-09-28 DIAGNOSIS — D239 Other benign neoplasm of skin, unspecified: Secondary | ICD-10-CM | POA: Diagnosis not present

## 2013-09-28 DIAGNOSIS — M069 Rheumatoid arthritis, unspecified: Secondary | ICD-10-CM | POA: Diagnosis not present

## 2013-09-28 DIAGNOSIS — I672 Cerebral atherosclerosis: Secondary | ICD-10-CM | POA: Diagnosis not present

## 2013-09-28 DIAGNOSIS — E119 Type 2 diabetes mellitus without complications: Secondary | ICD-10-CM | POA: Diagnosis not present

## 2013-09-29 DIAGNOSIS — M25549 Pain in joints of unspecified hand: Secondary | ICD-10-CM | POA: Diagnosis not present

## 2013-09-29 DIAGNOSIS — M19049 Primary osteoarthritis, unspecified hand: Secondary | ICD-10-CM | POA: Diagnosis not present

## 2013-10-01 DIAGNOSIS — I369 Nonrheumatic tricuspid valve disorder, unspecified: Secondary | ICD-10-CM | POA: Diagnosis not present

## 2013-10-01 DIAGNOSIS — Z0000 Encounter for general adult medical examination without abnormal findings: Secondary | ICD-10-CM | POA: Diagnosis not present

## 2013-10-01 DIAGNOSIS — I10 Essential (primary) hypertension: Secondary | ICD-10-CM | POA: Diagnosis not present

## 2013-10-01 DIAGNOSIS — I119 Hypertensive heart disease without heart failure: Secondary | ICD-10-CM | POA: Diagnosis not present

## 2013-10-01 DIAGNOSIS — E119 Type 2 diabetes mellitus without complications: Secondary | ICD-10-CM | POA: Diagnosis not present

## 2013-11-16 DIAGNOSIS — I672 Cerebral atherosclerosis: Secondary | ICD-10-CM | POA: Diagnosis not present

## 2013-11-16 DIAGNOSIS — E119 Type 2 diabetes mellitus without complications: Secondary | ICD-10-CM | POA: Diagnosis not present

## 2013-12-07 DIAGNOSIS — J309 Allergic rhinitis, unspecified: Secondary | ICD-10-CM | POA: Diagnosis not present

## 2013-12-07 DIAGNOSIS — Z125 Encounter for screening for malignant neoplasm of prostate: Secondary | ICD-10-CM | POA: Diagnosis not present

## 2013-12-07 DIAGNOSIS — E1129 Type 2 diabetes mellitus with other diabetic kidney complication: Secondary | ICD-10-CM | POA: Diagnosis not present

## 2013-12-07 DIAGNOSIS — J329 Chronic sinusitis, unspecified: Secondary | ICD-10-CM | POA: Diagnosis not present

## 2013-12-07 DIAGNOSIS — I10 Essential (primary) hypertension: Secondary | ICD-10-CM | POA: Diagnosis not present

## 2013-12-07 DIAGNOSIS — J45909 Unspecified asthma, uncomplicated: Secondary | ICD-10-CM | POA: Diagnosis not present

## 2013-12-08 DIAGNOSIS — J3489 Other specified disorders of nose and nasal sinuses: Secondary | ICD-10-CM | POA: Diagnosis not present

## 2013-12-14 DIAGNOSIS — N183 Chronic kidney disease, stage 3 unspecified: Secondary | ICD-10-CM | POA: Diagnosis not present

## 2013-12-14 DIAGNOSIS — E119 Type 2 diabetes mellitus without complications: Secondary | ICD-10-CM | POA: Diagnosis not present

## 2013-12-14 DIAGNOSIS — G4700 Insomnia, unspecified: Secondary | ICD-10-CM | POA: Diagnosis not present

## 2013-12-14 DIAGNOSIS — I10 Essential (primary) hypertension: Secondary | ICD-10-CM | POA: Diagnosis not present

## 2013-12-16 DIAGNOSIS — E119 Type 2 diabetes mellitus without complications: Secondary | ICD-10-CM | POA: Diagnosis not present

## 2013-12-16 DIAGNOSIS — I672 Cerebral atherosclerosis: Secondary | ICD-10-CM | POA: Diagnosis not present

## 2014-01-13 DIAGNOSIS — M19079 Primary osteoarthritis, unspecified ankle and foot: Secondary | ICD-10-CM | POA: Diagnosis not present

## 2014-01-13 DIAGNOSIS — M2550 Pain in unspecified joint: Secondary | ICD-10-CM | POA: Diagnosis not present

## 2014-01-13 DIAGNOSIS — R5381 Other malaise: Secondary | ICD-10-CM | POA: Diagnosis not present

## 2014-01-13 DIAGNOSIS — M19049 Primary osteoarthritis, unspecified hand: Secondary | ICD-10-CM | POA: Diagnosis not present

## 2014-01-13 DIAGNOSIS — N189 Chronic kidney disease, unspecified: Secondary | ICD-10-CM | POA: Diagnosis not present

## 2014-01-13 DIAGNOSIS — M069 Rheumatoid arthritis, unspecified: Secondary | ICD-10-CM | POA: Diagnosis not present

## 2014-01-13 DIAGNOSIS — M47817 Spondylosis without myelopathy or radiculopathy, lumbosacral region: Secondary | ICD-10-CM | POA: Diagnosis not present

## 2014-01-13 DIAGNOSIS — E1129 Type 2 diabetes mellitus with other diabetic kidney complication: Secondary | ICD-10-CM | POA: Diagnosis not present

## 2014-01-13 DIAGNOSIS — I10 Essential (primary) hypertension: Secondary | ICD-10-CM | POA: Diagnosis not present

## 2014-01-13 DIAGNOSIS — M545 Low back pain, unspecified: Secondary | ICD-10-CM | POA: Diagnosis not present

## 2014-01-16 DIAGNOSIS — E119 Type 2 diabetes mellitus without complications: Secondary | ICD-10-CM | POA: Diagnosis not present

## 2014-01-16 DIAGNOSIS — I672 Cerebral atherosclerosis: Secondary | ICD-10-CM | POA: Diagnosis not present

## 2014-01-18 DIAGNOSIS — E119 Type 2 diabetes mellitus without complications: Secondary | ICD-10-CM | POA: Diagnosis not present

## 2014-01-18 DIAGNOSIS — E1129 Type 2 diabetes mellitus with other diabetic kidney complication: Secondary | ICD-10-CM | POA: Diagnosis not present

## 2014-01-18 DIAGNOSIS — E782 Mixed hyperlipidemia: Secondary | ICD-10-CM | POA: Diagnosis not present

## 2014-01-18 DIAGNOSIS — N183 Chronic kidney disease, stage 3 unspecified: Secondary | ICD-10-CM | POA: Diagnosis not present

## 2014-01-26 DIAGNOSIS — M069 Rheumatoid arthritis, unspecified: Secondary | ICD-10-CM | POA: Diagnosis not present

## 2014-01-26 DIAGNOSIS — M1990 Unspecified osteoarthritis, unspecified site: Secondary | ICD-10-CM | POA: Diagnosis not present

## 2014-01-26 DIAGNOSIS — M2550 Pain in unspecified joint: Secondary | ICD-10-CM | POA: Diagnosis not present

## 2014-01-26 DIAGNOSIS — M545 Low back pain, unspecified: Secondary | ICD-10-CM | POA: Diagnosis not present

## 2014-02-16 DIAGNOSIS — E119 Type 2 diabetes mellitus without complications: Secondary | ICD-10-CM | POA: Diagnosis not present

## 2014-02-16 DIAGNOSIS — I672 Cerebral atherosclerosis: Secondary | ICD-10-CM | POA: Diagnosis not present

## 2014-02-17 DIAGNOSIS — N390 Urinary tract infection, site not specified: Secondary | ICD-10-CM | POA: Diagnosis not present

## 2014-02-22 ENCOUNTER — Ambulatory Visit: Payer: Medicare Other

## 2014-02-23 DIAGNOSIS — L818 Other specified disorders of pigmentation: Secondary | ICD-10-CM | POA: Diagnosis not present

## 2014-02-23 DIAGNOSIS — Z23 Encounter for immunization: Secondary | ICD-10-CM | POA: Diagnosis not present

## 2014-02-23 DIAGNOSIS — L663 Perifolliculitis capitis abscedens: Secondary | ICD-10-CM | POA: Diagnosis not present

## 2014-02-23 DIAGNOSIS — E1165 Type 2 diabetes mellitus with hyperglycemia: Secondary | ICD-10-CM | POA: Diagnosis not present

## 2014-02-23 DIAGNOSIS — I10 Essential (primary) hypertension: Secondary | ICD-10-CM | POA: Diagnosis not present

## 2014-02-23 DIAGNOSIS — Z794 Long term (current) use of insulin: Secondary | ICD-10-CM | POA: Diagnosis not present

## 2014-02-23 DIAGNOSIS — M545 Low back pain: Secondary | ICD-10-CM | POA: Diagnosis not present

## 2014-02-23 DIAGNOSIS — D239 Other benign neoplasm of skin, unspecified: Secondary | ICD-10-CM | POA: Diagnosis not present

## 2014-02-24 DIAGNOSIS — D899 Disorder involving the immune mechanism, unspecified: Secondary | ICD-10-CM | POA: Diagnosis not present

## 2014-02-24 DIAGNOSIS — M79643 Pain in unspecified hand: Secondary | ICD-10-CM | POA: Diagnosis not present

## 2014-02-24 DIAGNOSIS — R769 Abnormal immunological finding in serum, unspecified: Secondary | ICD-10-CM | POA: Diagnosis not present

## 2014-02-24 DIAGNOSIS — M0579 Rheumatoid arthritis with rheumatoid factor of multiple sites without organ or systems involvement: Secondary | ICD-10-CM | POA: Diagnosis not present

## 2014-02-24 DIAGNOSIS — M069 Rheumatoid arthritis, unspecified: Secondary | ICD-10-CM | POA: Diagnosis not present

## 2014-02-24 DIAGNOSIS — G4700 Insomnia, unspecified: Secondary | ICD-10-CM | POA: Diagnosis not present

## 2014-02-24 DIAGNOSIS — M818 Other osteoporosis without current pathological fracture: Secondary | ICD-10-CM | POA: Diagnosis not present

## 2014-02-24 DIAGNOSIS — M130 Polyarthritis, unspecified: Secondary | ICD-10-CM | POA: Diagnosis not present

## 2014-03-01 ENCOUNTER — Ambulatory Visit: Payer: Medicare Other

## 2014-03-02 DIAGNOSIS — E559 Vitamin D deficiency, unspecified: Secondary | ICD-10-CM | POA: Diagnosis not present

## 2014-03-02 DIAGNOSIS — M810 Age-related osteoporosis without current pathological fracture: Secondary | ICD-10-CM | POA: Diagnosis not present

## 2014-03-02 DIAGNOSIS — M0579 Rheumatoid arthritis with rheumatoid factor of multiple sites without organ or systems involvement: Secondary | ICD-10-CM | POA: Diagnosis not present

## 2014-03-08 ENCOUNTER — Ambulatory Visit: Payer: Medicare Other

## 2014-03-19 DIAGNOSIS — I10 Essential (primary) hypertension: Secondary | ICD-10-CM | POA: Diagnosis not present

## 2014-03-19 DIAGNOSIS — E1165 Type 2 diabetes mellitus with hyperglycemia: Secondary | ICD-10-CM | POA: Diagnosis not present

## 2014-03-19 DIAGNOSIS — L663 Perifolliculitis capitis abscedens: Secondary | ICD-10-CM | POA: Diagnosis not present

## 2014-03-19 DIAGNOSIS — D239 Other benign neoplasm of skin, unspecified: Secondary | ICD-10-CM | POA: Diagnosis not present

## 2014-03-19 DIAGNOSIS — L818 Other specified disorders of pigmentation: Secondary | ICD-10-CM | POA: Diagnosis not present

## 2014-03-21 DIAGNOSIS — M2550 Pain in unspecified joint: Secondary | ICD-10-CM | POA: Diagnosis not present

## 2014-03-21 DIAGNOSIS — M1990 Unspecified osteoarthritis, unspecified site: Secondary | ICD-10-CM | POA: Diagnosis not present

## 2014-03-21 DIAGNOSIS — E139 Other specified diabetes mellitus without complications: Secondary | ICD-10-CM | POA: Diagnosis not present

## 2014-03-21 DIAGNOSIS — M6281 Muscle weakness (generalized): Secondary | ICD-10-CM | POA: Diagnosis not present

## 2014-03-21 DIAGNOSIS — M2560 Stiffness of unspecified joint, not elsewhere classified: Secondary | ICD-10-CM | POA: Diagnosis not present

## 2014-03-21 DIAGNOSIS — E782 Mixed hyperlipidemia: Secondary | ICD-10-CM | POA: Diagnosis not present

## 2014-03-21 DIAGNOSIS — M545 Low back pain: Secondary | ICD-10-CM | POA: Diagnosis not present

## 2014-03-21 DIAGNOSIS — M0589 Other rheumatoid arthritis with rheumatoid factor of multiple sites: Secondary | ICD-10-CM | POA: Diagnosis not present

## 2014-03-21 DIAGNOSIS — R293 Abnormal posture: Secondary | ICD-10-CM | POA: Diagnosis not present

## 2014-03-21 DIAGNOSIS — E118 Type 2 diabetes mellitus with unspecified complications: Secondary | ICD-10-CM | POA: Diagnosis not present

## 2014-03-22 DIAGNOSIS — M2560 Stiffness of unspecified joint, not elsewhere classified: Secondary | ICD-10-CM | POA: Diagnosis not present

## 2014-03-22 DIAGNOSIS — M6281 Muscle weakness (generalized): Secondary | ICD-10-CM | POA: Diagnosis not present

## 2014-03-22 DIAGNOSIS — M545 Low back pain: Secondary | ICD-10-CM | POA: Diagnosis not present

## 2014-03-22 DIAGNOSIS — R293 Abnormal posture: Secondary | ICD-10-CM | POA: Diagnosis not present

## 2014-04-05 DIAGNOSIS — I10 Essential (primary) hypertension: Secondary | ICD-10-CM | POA: Diagnosis not present

## 2014-04-05 DIAGNOSIS — R05 Cough: Secondary | ICD-10-CM | POA: Diagnosis not present

## 2014-04-18 DIAGNOSIS — M545 Low back pain: Secondary | ICD-10-CM | POA: Diagnosis not present

## 2014-04-18 DIAGNOSIS — R293 Abnormal posture: Secondary | ICD-10-CM | POA: Diagnosis not present

## 2014-04-18 DIAGNOSIS — L663 Perifolliculitis capitis abscedens: Secondary | ICD-10-CM | POA: Diagnosis not present

## 2014-04-18 DIAGNOSIS — M6281 Muscle weakness (generalized): Secondary | ICD-10-CM | POA: Diagnosis not present

## 2014-04-18 DIAGNOSIS — E1165 Type 2 diabetes mellitus with hyperglycemia: Secondary | ICD-10-CM | POA: Diagnosis not present

## 2014-04-18 DIAGNOSIS — M2560 Stiffness of unspecified joint, not elsewhere classified: Secondary | ICD-10-CM | POA: Diagnosis not present

## 2014-04-18 DIAGNOSIS — D239 Other benign neoplasm of skin, unspecified: Secondary | ICD-10-CM | POA: Diagnosis not present

## 2014-04-18 DIAGNOSIS — I10 Essential (primary) hypertension: Secondary | ICD-10-CM | POA: Diagnosis not present

## 2014-04-18 DIAGNOSIS — L818 Other specified disorders of pigmentation: Secondary | ICD-10-CM | POA: Diagnosis not present

## 2014-04-19 DIAGNOSIS — I10 Essential (primary) hypertension: Secondary | ICD-10-CM | POA: Diagnosis not present

## 2014-04-19 DIAGNOSIS — E782 Mixed hyperlipidemia: Secondary | ICD-10-CM | POA: Diagnosis not present

## 2014-04-19 DIAGNOSIS — R05 Cough: Secondary | ICD-10-CM | POA: Diagnosis not present

## 2014-04-19 DIAGNOSIS — F5101 Primary insomnia: Secondary | ICD-10-CM | POA: Diagnosis not present

## 2014-04-19 DIAGNOSIS — M0589 Other rheumatoid arthritis with rheumatoid factor of multiple sites: Secondary | ICD-10-CM | POA: Diagnosis not present

## 2014-04-21 DIAGNOSIS — M2560 Stiffness of unspecified joint, not elsewhere classified: Secondary | ICD-10-CM | POA: Diagnosis not present

## 2014-04-21 DIAGNOSIS — M545 Low back pain: Secondary | ICD-10-CM | POA: Diagnosis not present

## 2014-04-21 DIAGNOSIS — M6281 Muscle weakness (generalized): Secondary | ICD-10-CM | POA: Diagnosis not present

## 2014-04-21 DIAGNOSIS — R293 Abnormal posture: Secondary | ICD-10-CM | POA: Diagnosis not present

## 2014-04-27 DIAGNOSIS — R05 Cough: Secondary | ICD-10-CM | POA: Diagnosis not present

## 2014-04-27 DIAGNOSIS — F5101 Primary insomnia: Secondary | ICD-10-CM | POA: Diagnosis not present

## 2014-04-27 DIAGNOSIS — J0100 Acute maxillary sinusitis, unspecified: Secondary | ICD-10-CM | POA: Diagnosis not present

## 2014-04-27 DIAGNOSIS — N400 Enlarged prostate without lower urinary tract symptoms: Secondary | ICD-10-CM | POA: Diagnosis not present

## 2014-04-28 DIAGNOSIS — M6281 Muscle weakness (generalized): Secondary | ICD-10-CM | POA: Diagnosis not present

## 2014-04-28 DIAGNOSIS — R293 Abnormal posture: Secondary | ICD-10-CM | POA: Diagnosis not present

## 2014-04-28 DIAGNOSIS — M2560 Stiffness of unspecified joint, not elsewhere classified: Secondary | ICD-10-CM | POA: Diagnosis not present

## 2014-04-28 DIAGNOSIS — M545 Low back pain: Secondary | ICD-10-CM | POA: Diagnosis not present

## 2014-05-03 DIAGNOSIS — M2560 Stiffness of unspecified joint, not elsewhere classified: Secondary | ICD-10-CM | POA: Diagnosis not present

## 2014-05-03 DIAGNOSIS — M545 Low back pain: Secondary | ICD-10-CM | POA: Diagnosis not present

## 2014-05-03 DIAGNOSIS — M6281 Muscle weakness (generalized): Secondary | ICD-10-CM | POA: Diagnosis not present

## 2014-05-03 DIAGNOSIS — R293 Abnormal posture: Secondary | ICD-10-CM | POA: Diagnosis not present

## 2014-05-06 DIAGNOSIS — M545 Low back pain: Secondary | ICD-10-CM | POA: Diagnosis not present

## 2014-05-06 DIAGNOSIS — M6281 Muscle weakness (generalized): Secondary | ICD-10-CM | POA: Diagnosis not present

## 2014-05-06 DIAGNOSIS — M2560 Stiffness of unspecified joint, not elsewhere classified: Secondary | ICD-10-CM | POA: Diagnosis not present

## 2014-05-06 DIAGNOSIS — R293 Abnormal posture: Secondary | ICD-10-CM | POA: Diagnosis not present

## 2014-05-09 DIAGNOSIS — E119 Type 2 diabetes mellitus without complications: Secondary | ICD-10-CM | POA: Diagnosis not present

## 2014-05-09 DIAGNOSIS — M2560 Stiffness of unspecified joint, not elsewhere classified: Secondary | ICD-10-CM | POA: Diagnosis not present

## 2014-05-09 DIAGNOSIS — H9193 Unspecified hearing loss, bilateral: Secondary | ICD-10-CM | POA: Diagnosis not present

## 2014-05-09 DIAGNOSIS — R293 Abnormal posture: Secondary | ICD-10-CM | POA: Diagnosis not present

## 2014-05-09 DIAGNOSIS — M545 Low back pain: Secondary | ICD-10-CM | POA: Diagnosis not present

## 2014-05-09 DIAGNOSIS — M6281 Muscle weakness (generalized): Secondary | ICD-10-CM | POA: Diagnosis not present

## 2014-05-09 DIAGNOSIS — E782 Mixed hyperlipidemia: Secondary | ICD-10-CM | POA: Diagnosis not present

## 2014-05-09 DIAGNOSIS — F5101 Primary insomnia: Secondary | ICD-10-CM | POA: Diagnosis not present

## 2014-05-18 DIAGNOSIS — M2560 Stiffness of unspecified joint, not elsewhere classified: Secondary | ICD-10-CM | POA: Diagnosis not present

## 2014-05-18 DIAGNOSIS — M545 Low back pain: Secondary | ICD-10-CM | POA: Diagnosis not present

## 2014-05-18 DIAGNOSIS — R293 Abnormal posture: Secondary | ICD-10-CM | POA: Diagnosis not present

## 2014-05-18 DIAGNOSIS — M6281 Muscle weakness (generalized): Secondary | ICD-10-CM | POA: Diagnosis not present

## 2014-05-19 DIAGNOSIS — L818 Other specified disorders of pigmentation: Secondary | ICD-10-CM | POA: Diagnosis not present

## 2014-05-19 DIAGNOSIS — D239 Other benign neoplasm of skin, unspecified: Secondary | ICD-10-CM | POA: Diagnosis not present

## 2014-05-19 DIAGNOSIS — M2560 Stiffness of unspecified joint, not elsewhere classified: Secondary | ICD-10-CM | POA: Diagnosis not present

## 2014-05-19 DIAGNOSIS — E1165 Type 2 diabetes mellitus with hyperglycemia: Secondary | ICD-10-CM | POA: Diagnosis not present

## 2014-05-19 DIAGNOSIS — I10 Essential (primary) hypertension: Secondary | ICD-10-CM | POA: Diagnosis not present

## 2014-05-19 DIAGNOSIS — R293 Abnormal posture: Secondary | ICD-10-CM | POA: Diagnosis not present

## 2014-05-19 DIAGNOSIS — M545 Low back pain: Secondary | ICD-10-CM | POA: Diagnosis not present

## 2014-05-19 DIAGNOSIS — M6281 Muscle weakness (generalized): Secondary | ICD-10-CM | POA: Diagnosis not present

## 2014-05-19 DIAGNOSIS — L663 Perifolliculitis capitis abscedens: Secondary | ICD-10-CM | POA: Diagnosis not present

## 2014-05-23 DIAGNOSIS — M0589 Other rheumatoid arthritis with rheumatoid factor of multiple sites: Secondary | ICD-10-CM | POA: Diagnosis not present

## 2014-05-23 DIAGNOSIS — M545 Low back pain: Secondary | ICD-10-CM | POA: Diagnosis not present

## 2014-05-23 DIAGNOSIS — M2550 Pain in unspecified joint: Secondary | ICD-10-CM | POA: Diagnosis not present

## 2014-05-23 DIAGNOSIS — M1990 Unspecified osteoarthritis, unspecified site: Secondary | ICD-10-CM | POA: Diagnosis not present

## 2014-05-27 DIAGNOSIS — R293 Abnormal posture: Secondary | ICD-10-CM | POA: Diagnosis not present

## 2014-05-27 DIAGNOSIS — M2560 Stiffness of unspecified joint, not elsewhere classified: Secondary | ICD-10-CM | POA: Diagnosis not present

## 2014-05-27 DIAGNOSIS — M6281 Muscle weakness (generalized): Secondary | ICD-10-CM | POA: Diagnosis not present

## 2014-05-27 DIAGNOSIS — M545 Low back pain: Secondary | ICD-10-CM | POA: Diagnosis not present

## 2014-05-31 DIAGNOSIS — M545 Low back pain: Secondary | ICD-10-CM | POA: Diagnosis not present

## 2014-05-31 DIAGNOSIS — M6281 Muscle weakness (generalized): Secondary | ICD-10-CM | POA: Diagnosis not present

## 2014-05-31 DIAGNOSIS — M2560 Stiffness of unspecified joint, not elsewhere classified: Secondary | ICD-10-CM | POA: Diagnosis not present

## 2014-05-31 DIAGNOSIS — R293 Abnormal posture: Secondary | ICD-10-CM | POA: Diagnosis not present

## 2014-06-02 DIAGNOSIS — M2560 Stiffness of unspecified joint, not elsewhere classified: Secondary | ICD-10-CM | POA: Diagnosis not present

## 2014-06-02 DIAGNOSIS — M545 Low back pain: Secondary | ICD-10-CM | POA: Diagnosis not present

## 2014-06-02 DIAGNOSIS — R293 Abnormal posture: Secondary | ICD-10-CM | POA: Diagnosis not present

## 2014-06-02 DIAGNOSIS — M6281 Muscle weakness (generalized): Secondary | ICD-10-CM | POA: Diagnosis not present

## 2014-07-01 DIAGNOSIS — N401 Enlarged prostate with lower urinary tract symptoms: Secondary | ICD-10-CM | POA: Diagnosis not present

## 2014-07-01 DIAGNOSIS — Z125 Encounter for screening for malignant neoplasm of prostate: Secondary | ICD-10-CM | POA: Diagnosis not present

## 2014-07-01 DIAGNOSIS — R350 Frequency of micturition: Secondary | ICD-10-CM | POA: Diagnosis not present

## 2014-07-11 DIAGNOSIS — H52223 Regular astigmatism, bilateral: Secondary | ICD-10-CM | POA: Diagnosis not present

## 2014-07-11 DIAGNOSIS — H2513 Age-related nuclear cataract, bilateral: Secondary | ICD-10-CM | POA: Diagnosis not present

## 2014-07-11 DIAGNOSIS — H04123 Dry eye syndrome of bilateral lacrimal glands: Secondary | ICD-10-CM | POA: Diagnosis not present

## 2014-07-18 DIAGNOSIS — I10 Essential (primary) hypertension: Secondary | ICD-10-CM | POA: Diagnosis not present

## 2014-07-18 DIAGNOSIS — Z760 Encounter for issue of repeat prescription: Secondary | ICD-10-CM | POA: Diagnosis not present

## 2014-07-18 DIAGNOSIS — E039 Hypothyroidism, unspecified: Secondary | ICD-10-CM | POA: Diagnosis not present

## 2014-07-18 DIAGNOSIS — M0579 Rheumatoid arthritis with rheumatoid factor of multiple sites without organ or systems involvement: Secondary | ICD-10-CM | POA: Diagnosis not present

## 2014-07-18 DIAGNOSIS — E1165 Type 2 diabetes mellitus with hyperglycemia: Secondary | ICD-10-CM | POA: Diagnosis not present

## 2014-07-18 DIAGNOSIS — M25531 Pain in right wrist: Secondary | ICD-10-CM | POA: Diagnosis not present

## 2014-07-18 DIAGNOSIS — E559 Vitamin D deficiency, unspecified: Secondary | ICD-10-CM | POA: Diagnosis not present

## 2014-07-27 DIAGNOSIS — M1990 Unspecified osteoarthritis, unspecified site: Secondary | ICD-10-CM | POA: Diagnosis not present

## 2014-07-27 DIAGNOSIS — E782 Mixed hyperlipidemia: Secondary | ICD-10-CM | POA: Diagnosis not present

## 2014-07-27 DIAGNOSIS — M2550 Pain in unspecified joint: Secondary | ICD-10-CM | POA: Diagnosis not present

## 2014-07-27 DIAGNOSIS — E118 Type 2 diabetes mellitus with unspecified complications: Secondary | ICD-10-CM | POA: Diagnosis not present

## 2014-07-27 DIAGNOSIS — I10 Essential (primary) hypertension: Secondary | ICD-10-CM | POA: Diagnosis not present

## 2014-07-27 DIAGNOSIS — M0589 Other rheumatoid arthritis with rheumatoid factor of multiple sites: Secondary | ICD-10-CM | POA: Diagnosis not present

## 2014-07-27 DIAGNOSIS — M545 Low back pain: Secondary | ICD-10-CM | POA: Diagnosis not present

## 2014-08-01 DIAGNOSIS — E119 Type 2 diabetes mellitus without complications: Secondary | ICD-10-CM | POA: Diagnosis not present

## 2014-08-01 DIAGNOSIS — I10 Essential (primary) hypertension: Secondary | ICD-10-CM | POA: Diagnosis not present

## 2014-08-01 DIAGNOSIS — H9193 Unspecified hearing loss, bilateral: Secondary | ICD-10-CM | POA: Diagnosis not present

## 2014-08-01 DIAGNOSIS — R1314 Dysphagia, pharyngoesophageal phase: Secondary | ICD-10-CM | POA: Diagnosis not present

## 2014-08-02 ENCOUNTER — Other Ambulatory Visit: Payer: Self-pay | Admitting: Internal Medicine

## 2014-08-02 DIAGNOSIS — R1310 Dysphagia, unspecified: Secondary | ICD-10-CM

## 2014-08-03 ENCOUNTER — Ambulatory Visit
Admission: RE | Admit: 2014-08-03 | Discharge: 2014-08-03 | Disposition: A | Discharge status: Home / Self Care | Payer: Medicare Other | Source: Ambulatory Visit | Attending: Internal Medicine | Admitting: Internal Medicine

## 2014-08-03 DIAGNOSIS — R1310 Dysphagia, unspecified: Secondary | ICD-10-CM

## 2014-08-03 DIAGNOSIS — M47816 Spondylosis without myelopathy or radiculopathy, lumbar region: Secondary | ICD-10-CM | POA: Diagnosis not present

## 2014-08-04 ENCOUNTER — Other Ambulatory Visit (HOSPITAL_COMMUNITY): Payer: Self-pay | Admitting: Physical Medicine and Rehabilitation

## 2014-08-04 DIAGNOSIS — M47816 Spondylosis without myelopathy or radiculopathy, lumbar region: Secondary | ICD-10-CM

## 2014-08-09 DIAGNOSIS — H9193 Unspecified hearing loss, bilateral: Secondary | ICD-10-CM | POA: Diagnosis not present

## 2014-08-09 DIAGNOSIS — L299 Pruritus, unspecified: Secondary | ICD-10-CM | POA: Diagnosis not present

## 2014-08-09 DIAGNOSIS — H6193 Disorder of external ear, unspecified, bilateral: Secondary | ICD-10-CM | POA: Diagnosis not present

## 2014-08-10 DIAGNOSIS — R21 Rash and other nonspecific skin eruption: Secondary | ICD-10-CM | POA: Diagnosis not present

## 2014-08-10 DIAGNOSIS — R1310 Dysphagia, unspecified: Secondary | ICD-10-CM | POA: Diagnosis not present

## 2014-08-10 DIAGNOSIS — R140 Abdominal distension (gaseous): Secondary | ICD-10-CM | POA: Diagnosis not present

## 2014-08-10 DIAGNOSIS — K5730 Diverticulosis of large intestine without perforation or abscess without bleeding: Secondary | ICD-10-CM | POA: Diagnosis not present

## 2014-08-10 DIAGNOSIS — R933 Abnormal findings on diagnostic imaging of other parts of digestive tract: Secondary | ICD-10-CM | POA: Diagnosis not present

## 2014-08-10 DIAGNOSIS — Z86010 Personal history of colonic polyps: Secondary | ICD-10-CM | POA: Diagnosis not present

## 2014-08-10 DIAGNOSIS — D500 Iron deficiency anemia secondary to blood loss (chronic): Secondary | ICD-10-CM | POA: Diagnosis not present

## 2014-08-10 DIAGNOSIS — D649 Anemia, unspecified: Secondary | ICD-10-CM | POA: Diagnosis not present

## 2014-08-10 DIAGNOSIS — R1314 Dysphagia, pharyngoesophageal phase: Secondary | ICD-10-CM | POA: Diagnosis not present

## 2014-08-10 DIAGNOSIS — K222 Esophageal obstruction: Secondary | ICD-10-CM | POA: Diagnosis not present

## 2014-08-12 ENCOUNTER — Ambulatory Visit (HOSPITAL_COMMUNITY)
Admission: RE | Admit: 2014-08-12 | Discharge: 2014-08-12 | Disposition: A | Discharge status: Home / Self Care | Payer: Medicare Other | Source: Ambulatory Visit | Attending: Physical Medicine and Rehabilitation | Admitting: Physical Medicine and Rehabilitation

## 2014-08-12 DIAGNOSIS — M4316 Spondylolisthesis, lumbar region: Secondary | ICD-10-CM | POA: Diagnosis not present

## 2014-08-12 DIAGNOSIS — M5126 Other intervertebral disc displacement, lumbar region: Secondary | ICD-10-CM | POA: Diagnosis not present

## 2014-08-12 DIAGNOSIS — M47816 Spondylosis without myelopathy or radiculopathy, lumbar region: Secondary | ICD-10-CM | POA: Diagnosis not present

## 2014-08-12 DIAGNOSIS — M9973 Connective tissue and disc stenosis of intervertebral foramina of lumbar region: Secondary | ICD-10-CM | POA: Diagnosis not present

## 2014-08-12 DIAGNOSIS — M5136 Other intervertebral disc degeneration, lumbar region: Secondary | ICD-10-CM | POA: Diagnosis not present

## 2014-08-16 DIAGNOSIS — D120 Benign neoplasm of cecum: Secondary | ICD-10-CM | POA: Diagnosis not present

## 2014-08-16 DIAGNOSIS — K449 Diaphragmatic hernia without obstruction or gangrene: Secondary | ICD-10-CM | POA: Diagnosis not present

## 2014-08-16 DIAGNOSIS — K222 Esophageal obstruction: Secondary | ICD-10-CM | POA: Diagnosis not present

## 2014-08-16 DIAGNOSIS — Z1211 Encounter for screening for malignant neoplasm of colon: Secondary | ICD-10-CM | POA: Diagnosis not present

## 2014-08-16 DIAGNOSIS — K5730 Diverticulosis of large intestine without perforation or abscess without bleeding: Secondary | ICD-10-CM | POA: Diagnosis not present

## 2014-08-16 DIAGNOSIS — R1310 Dysphagia, unspecified: Secondary | ICD-10-CM | POA: Diagnosis not present

## 2014-08-16 DIAGNOSIS — D125 Benign neoplasm of sigmoid colon: Secondary | ICD-10-CM | POA: Diagnosis not present

## 2014-08-16 DIAGNOSIS — K635 Polyp of colon: Secondary | ICD-10-CM | POA: Diagnosis not present

## 2014-08-16 DIAGNOSIS — K208 Other esophagitis: Secondary | ICD-10-CM | POA: Diagnosis not present

## 2014-08-16 DIAGNOSIS — Z86010 Personal history of colonic polyps: Secondary | ICD-10-CM | POA: Diagnosis not present

## 2014-08-18 DIAGNOSIS — E1165 Type 2 diabetes mellitus with hyperglycemia: Secondary | ICD-10-CM | POA: Diagnosis not present

## 2014-08-18 DIAGNOSIS — I10 Essential (primary) hypertension: Secondary | ICD-10-CM | POA: Diagnosis not present

## 2014-08-24 DIAGNOSIS — M5136 Other intervertebral disc degeneration, lumbar region: Secondary | ICD-10-CM | POA: Diagnosis not present

## 2014-08-24 DIAGNOSIS — M5441 Lumbago with sciatica, right side: Secondary | ICD-10-CM | POA: Diagnosis not present

## 2014-10-10 DIAGNOSIS — E559 Vitamin D deficiency, unspecified: Secondary | ICD-10-CM | POA: Diagnosis not present

## 2014-10-10 DIAGNOSIS — Z79899 Other long term (current) drug therapy: Secondary | ICD-10-CM | POA: Diagnosis not present

## 2014-10-10 DIAGNOSIS — Z760 Encounter for issue of repeat prescription: Secondary | ICD-10-CM | POA: Diagnosis not present

## 2014-10-10 DIAGNOSIS — T7840XA Allergy, unspecified, initial encounter: Secondary | ICD-10-CM | POA: Diagnosis not present

## 2014-10-10 DIAGNOSIS — M0579 Rheumatoid arthritis with rheumatoid factor of multiple sites without organ or systems involvement: Secondary | ICD-10-CM | POA: Diagnosis not present

## 2014-10-19 DIAGNOSIS — Z23 Encounter for immunization: Secondary | ICD-10-CM | POA: Diagnosis not present

## 2014-10-19 DIAGNOSIS — R5383 Other fatigue: Secondary | ICD-10-CM | POA: Diagnosis not present

## 2014-10-19 DIAGNOSIS — Z125 Encounter for screening for malignant neoplasm of prostate: Secondary | ICD-10-CM | POA: Diagnosis not present

## 2014-10-19 DIAGNOSIS — J309 Allergic rhinitis, unspecified: Secondary | ICD-10-CM | POA: Diagnosis not present

## 2014-10-19 DIAGNOSIS — R0982 Postnasal drip: Secondary | ICD-10-CM | POA: Diagnosis not present

## 2014-10-19 DIAGNOSIS — I10 Essential (primary) hypertension: Secondary | ICD-10-CM | POA: Diagnosis not present

## 2014-10-19 DIAGNOSIS — T7840XA Allergy, unspecified, initial encounter: Secondary | ICD-10-CM | POA: Diagnosis not present

## 2014-10-19 DIAGNOSIS — E119 Type 2 diabetes mellitus without complications: Secondary | ICD-10-CM | POA: Diagnosis not present

## 2014-10-19 DIAGNOSIS — H1045 Other chronic allergic conjunctivitis: Secondary | ICD-10-CM | POA: Diagnosis not present

## 2014-10-19 DIAGNOSIS — E785 Hyperlipidemia, unspecified: Secondary | ICD-10-CM | POA: Diagnosis not present

## 2014-10-19 DIAGNOSIS — J301 Allergic rhinitis due to pollen: Secondary | ICD-10-CM | POA: Diagnosis not present

## 2014-10-19 DIAGNOSIS — M1990 Unspecified osteoarthritis, unspecified site: Secondary | ICD-10-CM | POA: Diagnosis not present

## 2014-10-20 DIAGNOSIS — M25539 Pain in unspecified wrist: Secondary | ICD-10-CM | POA: Diagnosis not present

## 2014-10-20 DIAGNOSIS — H1045 Other chronic allergic conjunctivitis: Secondary | ICD-10-CM | POA: Diagnosis not present

## 2014-10-20 DIAGNOSIS — J301 Allergic rhinitis due to pollen: Secondary | ICD-10-CM | POA: Diagnosis not present

## 2014-10-20 DIAGNOSIS — T7840XA Allergy, unspecified, initial encounter: Secondary | ICD-10-CM | POA: Diagnosis not present

## 2014-10-20 DIAGNOSIS — M0579 Rheumatoid arthritis with rheumatoid factor of multiple sites without organ or systems involvement: Secondary | ICD-10-CM | POA: Diagnosis not present

## 2014-10-20 DIAGNOSIS — J309 Allergic rhinitis, unspecified: Secondary | ICD-10-CM | POA: Diagnosis not present

## 2014-10-20 DIAGNOSIS — R0982 Postnasal drip: Secondary | ICD-10-CM | POA: Diagnosis not present

## 2014-10-20 DIAGNOSIS — Z760 Encounter for issue of repeat prescription: Secondary | ICD-10-CM | POA: Diagnosis not present

## 2014-10-20 DIAGNOSIS — E559 Vitamin D deficiency, unspecified: Secondary | ICD-10-CM | POA: Diagnosis not present

## 2014-10-21 DIAGNOSIS — H1045 Other chronic allergic conjunctivitis: Secondary | ICD-10-CM | POA: Diagnosis not present

## 2014-10-21 DIAGNOSIS — J301 Allergic rhinitis due to pollen: Secondary | ICD-10-CM | POA: Diagnosis not present

## 2014-10-21 DIAGNOSIS — J309 Allergic rhinitis, unspecified: Secondary | ICD-10-CM | POA: Diagnosis not present

## 2014-10-21 DIAGNOSIS — R0982 Postnasal drip: Secondary | ICD-10-CM | POA: Diagnosis not present

## 2014-10-22 DIAGNOSIS — H1045 Other chronic allergic conjunctivitis: Secondary | ICD-10-CM | POA: Diagnosis not present

## 2014-10-22 DIAGNOSIS — R0982 Postnasal drip: Secondary | ICD-10-CM | POA: Diagnosis not present

## 2014-10-22 DIAGNOSIS — J309 Allergic rhinitis, unspecified: Secondary | ICD-10-CM | POA: Diagnosis not present

## 2014-10-22 DIAGNOSIS — J301 Allergic rhinitis due to pollen: Secondary | ICD-10-CM | POA: Diagnosis not present

## 2014-10-24 DIAGNOSIS — H1045 Other chronic allergic conjunctivitis: Secondary | ICD-10-CM | POA: Diagnosis not present

## 2014-10-24 DIAGNOSIS — R0982 Postnasal drip: Secondary | ICD-10-CM | POA: Diagnosis not present

## 2014-10-24 DIAGNOSIS — J309 Allergic rhinitis, unspecified: Secondary | ICD-10-CM | POA: Diagnosis not present

## 2014-10-24 DIAGNOSIS — J301 Allergic rhinitis due to pollen: Secondary | ICD-10-CM | POA: Diagnosis not present

## 2014-10-25 DIAGNOSIS — J301 Allergic rhinitis due to pollen: Secondary | ICD-10-CM | POA: Diagnosis not present

## 2014-10-25 DIAGNOSIS — H1045 Other chronic allergic conjunctivitis: Secondary | ICD-10-CM | POA: Diagnosis not present

## 2014-10-25 DIAGNOSIS — R0982 Postnasal drip: Secondary | ICD-10-CM | POA: Diagnosis not present

## 2014-10-25 DIAGNOSIS — J309 Allergic rhinitis, unspecified: Secondary | ICD-10-CM | POA: Diagnosis not present

## 2014-10-26 DIAGNOSIS — H1045 Other chronic allergic conjunctivitis: Secondary | ICD-10-CM | POA: Diagnosis not present

## 2014-10-26 DIAGNOSIS — R0982 Postnasal drip: Secondary | ICD-10-CM | POA: Diagnosis not present

## 2014-10-26 DIAGNOSIS — J309 Allergic rhinitis, unspecified: Secondary | ICD-10-CM | POA: Diagnosis not present

## 2014-10-26 DIAGNOSIS — J301 Allergic rhinitis due to pollen: Secondary | ICD-10-CM | POA: Diagnosis not present

## 2014-10-27 DIAGNOSIS — H1045 Other chronic allergic conjunctivitis: Secondary | ICD-10-CM | POA: Diagnosis not present

## 2014-10-27 DIAGNOSIS — M0579 Rheumatoid arthritis with rheumatoid factor of multiple sites without organ or systems involvement: Secondary | ICD-10-CM | POA: Diagnosis not present

## 2014-10-27 DIAGNOSIS — J301 Allergic rhinitis due to pollen: Secondary | ICD-10-CM | POA: Diagnosis not present

## 2014-10-27 DIAGNOSIS — R0982 Postnasal drip: Secondary | ICD-10-CM | POA: Diagnosis not present

## 2014-10-27 DIAGNOSIS — J309 Allergic rhinitis, unspecified: Secondary | ICD-10-CM | POA: Diagnosis not present

## 2014-10-27 DIAGNOSIS — E559 Vitamin D deficiency, unspecified: Secondary | ICD-10-CM | POA: Diagnosis not present

## 2014-10-28 DIAGNOSIS — J309 Allergic rhinitis, unspecified: Secondary | ICD-10-CM | POA: Diagnosis not present

## 2014-10-28 DIAGNOSIS — R0982 Postnasal drip: Secondary | ICD-10-CM | POA: Diagnosis not present

## 2014-10-28 DIAGNOSIS — H1045 Other chronic allergic conjunctivitis: Secondary | ICD-10-CM | POA: Diagnosis not present

## 2014-10-28 DIAGNOSIS — J301 Allergic rhinitis due to pollen: Secondary | ICD-10-CM | POA: Diagnosis not present

## 2014-10-29 DIAGNOSIS — R0982 Postnasal drip: Secondary | ICD-10-CM | POA: Diagnosis not present

## 2014-10-29 DIAGNOSIS — H1045 Other chronic allergic conjunctivitis: Secondary | ICD-10-CM | POA: Diagnosis not present

## 2014-10-29 DIAGNOSIS — J309 Allergic rhinitis, unspecified: Secondary | ICD-10-CM | POA: Diagnosis not present

## 2014-10-29 DIAGNOSIS — J301 Allergic rhinitis due to pollen: Secondary | ICD-10-CM | POA: Diagnosis not present

## 2014-10-31 DIAGNOSIS — J309 Allergic rhinitis, unspecified: Secondary | ICD-10-CM | POA: Diagnosis not present

## 2014-10-31 DIAGNOSIS — R0982 Postnasal drip: Secondary | ICD-10-CM | POA: Diagnosis not present

## 2014-10-31 DIAGNOSIS — J301 Allergic rhinitis due to pollen: Secondary | ICD-10-CM | POA: Diagnosis not present

## 2014-10-31 DIAGNOSIS — H1045 Other chronic allergic conjunctivitis: Secondary | ICD-10-CM | POA: Diagnosis not present

## 2014-11-01 DIAGNOSIS — N529 Male erectile dysfunction, unspecified: Secondary | ICD-10-CM | POA: Diagnosis not present

## 2014-11-01 DIAGNOSIS — R0982 Postnasal drip: Secondary | ICD-10-CM | POA: Diagnosis not present

## 2014-11-01 DIAGNOSIS — N401 Enlarged prostate with lower urinary tract symptoms: Secondary | ICD-10-CM | POA: Diagnosis not present

## 2014-11-01 DIAGNOSIS — R3915 Urgency of urination: Secondary | ICD-10-CM | POA: Diagnosis not present

## 2014-11-01 DIAGNOSIS — H1045 Other chronic allergic conjunctivitis: Secondary | ICD-10-CM | POA: Diagnosis not present

## 2014-11-01 DIAGNOSIS — R799 Abnormal finding of blood chemistry, unspecified: Secondary | ICD-10-CM | POA: Diagnosis not present

## 2014-11-01 DIAGNOSIS — J309 Allergic rhinitis, unspecified: Secondary | ICD-10-CM | POA: Diagnosis not present

## 2014-11-01 DIAGNOSIS — Z125 Encounter for screening for malignant neoplasm of prostate: Secondary | ICD-10-CM | POA: Diagnosis not present

## 2014-11-01 DIAGNOSIS — N358 Other urethral stricture: Secondary | ICD-10-CM | POA: Diagnosis not present

## 2014-11-01 DIAGNOSIS — E291 Testicular hypofunction: Secondary | ICD-10-CM | POA: Diagnosis not present

## 2014-11-01 DIAGNOSIS — J301 Allergic rhinitis due to pollen: Secondary | ICD-10-CM | POA: Diagnosis not present

## 2014-11-02 DIAGNOSIS — H1045 Other chronic allergic conjunctivitis: Secondary | ICD-10-CM | POA: Diagnosis not present

## 2014-11-02 DIAGNOSIS — J309 Allergic rhinitis, unspecified: Secondary | ICD-10-CM | POA: Diagnosis not present

## 2014-11-02 DIAGNOSIS — J301 Allergic rhinitis due to pollen: Secondary | ICD-10-CM | POA: Diagnosis not present

## 2014-11-02 DIAGNOSIS — R0982 Postnasal drip: Secondary | ICD-10-CM | POA: Diagnosis not present

## 2014-11-17 DIAGNOSIS — I10 Essential (primary) hypertension: Secondary | ICD-10-CM | POA: Diagnosis not present

## 2014-11-17 DIAGNOSIS — E119 Type 2 diabetes mellitus without complications: Secondary | ICD-10-CM | POA: Diagnosis not present

## 2014-11-23 DIAGNOSIS — D649 Anemia, unspecified: Secondary | ICD-10-CM | POA: Diagnosis not present

## 2014-11-23 DIAGNOSIS — I10 Essential (primary) hypertension: Secondary | ICD-10-CM | POA: Diagnosis not present

## 2014-11-29 DIAGNOSIS — F411 Generalized anxiety disorder: Secondary | ICD-10-CM | POA: Diagnosis not present

## 2014-11-29 DIAGNOSIS — E119 Type 2 diabetes mellitus without complications: Secondary | ICD-10-CM | POA: Diagnosis not present

## 2014-11-29 DIAGNOSIS — E782 Mixed hyperlipidemia: Secondary | ICD-10-CM | POA: Diagnosis not present

## 2014-11-29 DIAGNOSIS — I10 Essential (primary) hypertension: Secondary | ICD-10-CM | POA: Diagnosis not present

## 2014-12-18 DIAGNOSIS — E782 Mixed hyperlipidemia: Secondary | ICD-10-CM | POA: Diagnosis not present

## 2014-12-18 DIAGNOSIS — I10 Essential (primary) hypertension: Secondary | ICD-10-CM | POA: Diagnosis not present

## 2014-12-18 DIAGNOSIS — M059 Rheumatoid arthritis with rheumatoid factor, unspecified: Secondary | ICD-10-CM | POA: Diagnosis not present

## 2014-12-18 DIAGNOSIS — E1165 Type 2 diabetes mellitus with hyperglycemia: Secondary | ICD-10-CM | POA: Diagnosis not present

## 2014-12-20 DIAGNOSIS — M0589 Other rheumatoid arthritis with rheumatoid factor of multiple sites: Secondary | ICD-10-CM | POA: Diagnosis not present

## 2014-12-20 DIAGNOSIS — M549 Dorsalgia, unspecified: Secondary | ICD-10-CM | POA: Diagnosis not present

## 2014-12-20 DIAGNOSIS — Z79899 Other long term (current) drug therapy: Secondary | ICD-10-CM | POA: Diagnosis not present

## 2014-12-20 DIAGNOSIS — R109 Unspecified abdominal pain: Secondary | ICD-10-CM | POA: Diagnosis not present

## 2014-12-28 DIAGNOSIS — M5441 Lumbago with sciatica, right side: Secondary | ICD-10-CM | POA: Diagnosis not present

## 2015-01-11 DIAGNOSIS — R21 Rash and other nonspecific skin eruption: Secondary | ICD-10-CM | POA: Diagnosis not present

## 2015-01-11 DIAGNOSIS — R5383 Other fatigue: Secondary | ICD-10-CM | POA: Diagnosis not present

## 2015-01-11 DIAGNOSIS — E782 Mixed hyperlipidemia: Secondary | ICD-10-CM | POA: Diagnosis not present

## 2015-01-11 DIAGNOSIS — F411 Generalized anxiety disorder: Secondary | ICD-10-CM | POA: Diagnosis not present

## 2015-01-18 DIAGNOSIS — E1165 Type 2 diabetes mellitus with hyperglycemia: Secondary | ICD-10-CM | POA: Diagnosis not present

## 2015-01-18 DIAGNOSIS — I10 Essential (primary) hypertension: Secondary | ICD-10-CM | POA: Diagnosis not present

## 2015-01-18 DIAGNOSIS — E782 Mixed hyperlipidemia: Secondary | ICD-10-CM | POA: Diagnosis not present

## 2015-02-03 DIAGNOSIS — I10 Essential (primary) hypertension: Secondary | ICD-10-CM | POA: Diagnosis not present

## 2015-02-03 DIAGNOSIS — J40 Bronchitis, not specified as acute or chronic: Secondary | ICD-10-CM | POA: Diagnosis not present

## 2015-02-03 DIAGNOSIS — J45909 Unspecified asthma, uncomplicated: Secondary | ICD-10-CM | POA: Diagnosis not present

## 2015-02-03 DIAGNOSIS — J309 Allergic rhinitis, unspecified: Secondary | ICD-10-CM | POA: Diagnosis not present

## 2015-02-03 DIAGNOSIS — E780 Pure hypercholesterolemia: Secondary | ICD-10-CM | POA: Diagnosis not present

## 2015-02-03 DIAGNOSIS — J019 Acute sinusitis, unspecified: Secondary | ICD-10-CM | POA: Diagnosis not present

## 2015-02-03 DIAGNOSIS — R05 Cough: Secondary | ICD-10-CM | POA: Diagnosis not present

## 2015-02-03 DIAGNOSIS — B359 Dermatophytosis, unspecified: Secondary | ICD-10-CM | POA: Diagnosis not present

## 2015-02-03 DIAGNOSIS — M1990 Unspecified osteoarthritis, unspecified site: Secondary | ICD-10-CM | POA: Diagnosis not present

## 2015-02-03 DIAGNOSIS — Z23 Encounter for immunization: Secondary | ICD-10-CM | POA: Diagnosis not present

## 2015-02-17 DIAGNOSIS — M059 Rheumatoid arthritis with rheumatoid factor, unspecified: Secondary | ICD-10-CM | POA: Diagnosis not present

## 2015-02-17 DIAGNOSIS — E782 Mixed hyperlipidemia: Secondary | ICD-10-CM | POA: Diagnosis not present

## 2015-02-17 DIAGNOSIS — I10 Essential (primary) hypertension: Secondary | ICD-10-CM | POA: Diagnosis not present

## 2015-02-17 DIAGNOSIS — E1165 Type 2 diabetes mellitus with hyperglycemia: Secondary | ICD-10-CM | POA: Diagnosis not present

## 2015-02-23 DIAGNOSIS — Z79899 Other long term (current) drug therapy: Secondary | ICD-10-CM | POA: Diagnosis not present

## 2015-02-23 DIAGNOSIS — M25539 Pain in unspecified wrist: Secondary | ICD-10-CM | POA: Diagnosis not present

## 2015-02-23 DIAGNOSIS — M0579 Rheumatoid arthritis with rheumatoid factor of multiple sites without organ or systems involvement: Secondary | ICD-10-CM | POA: Diagnosis not present

## 2015-02-27 DIAGNOSIS — R350 Frequency of micturition: Secondary | ICD-10-CM | POA: Diagnosis not present

## 2015-02-27 DIAGNOSIS — N401 Enlarged prostate with lower urinary tract symptoms: Secondary | ICD-10-CM | POA: Diagnosis not present

## 2015-02-27 DIAGNOSIS — N358 Other urethral stricture: Secondary | ICD-10-CM | POA: Diagnosis not present

## 2015-02-27 DIAGNOSIS — N529 Male erectile dysfunction, unspecified: Secondary | ICD-10-CM | POA: Diagnosis not present

## 2015-02-27 DIAGNOSIS — R3915 Urgency of urination: Secondary | ICD-10-CM | POA: Diagnosis not present

## 2015-03-07 DIAGNOSIS — N401 Enlarged prostate with lower urinary tract symptoms: Secondary | ICD-10-CM | POA: Diagnosis not present

## 2015-03-07 DIAGNOSIS — R350 Frequency of micturition: Secondary | ICD-10-CM | POA: Diagnosis not present

## 2015-03-07 DIAGNOSIS — N320 Bladder-neck obstruction: Secondary | ICD-10-CM | POA: Diagnosis not present

## 2015-03-07 DIAGNOSIS — R39198 Other difficulties with micturition: Secondary | ICD-10-CM | POA: Diagnosis not present

## 2015-03-20 DIAGNOSIS — E1165 Type 2 diabetes mellitus with hyperglycemia: Secondary | ICD-10-CM | POA: Diagnosis not present

## 2015-03-20 DIAGNOSIS — I10 Essential (primary) hypertension: Secondary | ICD-10-CM | POA: Diagnosis not present

## 2015-03-20 DIAGNOSIS — E782 Mixed hyperlipidemia: Secondary | ICD-10-CM | POA: Diagnosis not present

## 2015-03-20 DIAGNOSIS — M059 Rheumatoid arthritis with rheumatoid factor, unspecified: Secondary | ICD-10-CM | POA: Diagnosis not present

## 2015-04-05 DIAGNOSIS — H40003 Preglaucoma, unspecified, bilateral: Secondary | ICD-10-CM | POA: Diagnosis not present

## 2015-04-05 DIAGNOSIS — H04123 Dry eye syndrome of bilateral lacrimal glands: Secondary | ICD-10-CM | POA: Diagnosis not present

## 2015-04-06 DIAGNOSIS — L02229 Furuncle of trunk, unspecified: Secondary | ICD-10-CM | POA: Diagnosis not present

## 2015-04-06 DIAGNOSIS — B9689 Other specified bacterial agents as the cause of diseases classified elsewhere: Secondary | ICD-10-CM | POA: Diagnosis not present

## 2015-04-06 DIAGNOSIS — L700 Acne vulgaris: Secondary | ICD-10-CM | POA: Diagnosis not present

## 2015-04-10 DIAGNOSIS — J301 Allergic rhinitis due to pollen: Secondary | ICD-10-CM | POA: Diagnosis not present

## 2015-04-10 DIAGNOSIS — Z1389 Encounter for screening for other disorder: Secondary | ICD-10-CM | POA: Diagnosis not present

## 2015-04-10 DIAGNOSIS — E119 Type 2 diabetes mellitus without complications: Secondary | ICD-10-CM | POA: Diagnosis not present

## 2015-04-10 DIAGNOSIS — E782 Mixed hyperlipidemia: Secondary | ICD-10-CM | POA: Diagnosis not present

## 2015-04-10 DIAGNOSIS — F411 Generalized anxiety disorder: Secondary | ICD-10-CM | POA: Diagnosis not present

## 2015-04-10 DIAGNOSIS — M150 Primary generalized (osteo)arthritis: Secondary | ICD-10-CM | POA: Diagnosis not present

## 2015-04-10 DIAGNOSIS — I10 Essential (primary) hypertension: Secondary | ICD-10-CM | POA: Diagnosis not present

## 2015-04-10 DIAGNOSIS — J4520 Mild intermittent asthma, uncomplicated: Secondary | ICD-10-CM | POA: Diagnosis not present

## 2015-04-17 DIAGNOSIS — I10 Essential (primary) hypertension: Secondary | ICD-10-CM | POA: Diagnosis not present

## 2015-04-17 DIAGNOSIS — E782 Mixed hyperlipidemia: Secondary | ICD-10-CM | POA: Diagnosis not present

## 2015-04-17 DIAGNOSIS — E1121 Type 2 diabetes mellitus with diabetic nephropathy: Secondary | ICD-10-CM | POA: Diagnosis not present

## 2015-04-17 DIAGNOSIS — E119 Type 2 diabetes mellitus without complications: Secondary | ICD-10-CM | POA: Diagnosis not present

## 2015-04-20 DIAGNOSIS — H04123 Dry eye syndrome of bilateral lacrimal glands: Secondary | ICD-10-CM | POA: Diagnosis not present

## 2015-05-11 DIAGNOSIS — M549 Dorsalgia, unspecified: Secondary | ICD-10-CM | POA: Diagnosis not present

## 2015-05-11 DIAGNOSIS — M0589 Other rheumatoid arthritis with rheumatoid factor of multiple sites: Secondary | ICD-10-CM | POA: Diagnosis not present

## 2015-05-11 DIAGNOSIS — Z79899 Other long term (current) drug therapy: Secondary | ICD-10-CM | POA: Diagnosis not present

## 2015-05-20 DIAGNOSIS — I10 Essential (primary) hypertension: Secondary | ICD-10-CM | POA: Diagnosis not present

## 2015-05-20 DIAGNOSIS — E1165 Type 2 diabetes mellitus with hyperglycemia: Secondary | ICD-10-CM | POA: Diagnosis not present

## 2015-05-20 DIAGNOSIS — E782 Mixed hyperlipidemia: Secondary | ICD-10-CM | POA: Diagnosis not present

## 2015-05-20 DIAGNOSIS — M059 Rheumatoid arthritis with rheumatoid factor, unspecified: Secondary | ICD-10-CM | POA: Diagnosis not present

## 2015-06-01 DIAGNOSIS — I10 Essential (primary) hypertension: Secondary | ICD-10-CM | POA: Diagnosis not present

## 2015-06-01 DIAGNOSIS — E782 Mixed hyperlipidemia: Secondary | ICD-10-CM | POA: Diagnosis not present

## 2015-06-01 DIAGNOSIS — E119 Type 2 diabetes mellitus without complications: Secondary | ICD-10-CM | POA: Diagnosis not present

## 2015-06-01 DIAGNOSIS — E1121 Type 2 diabetes mellitus with diabetic nephropathy: Secondary | ICD-10-CM | POA: Diagnosis not present

## 2015-06-08 DIAGNOSIS — E782 Mixed hyperlipidemia: Secondary | ICD-10-CM | POA: Diagnosis not present

## 2015-06-08 DIAGNOSIS — E119 Type 2 diabetes mellitus without complications: Secondary | ICD-10-CM | POA: Diagnosis not present

## 2015-06-08 DIAGNOSIS — I10 Essential (primary) hypertension: Secondary | ICD-10-CM | POA: Diagnosis not present

## 2015-07-25 DIAGNOSIS — E782 Mixed hyperlipidemia: Secondary | ICD-10-CM | POA: Diagnosis not present

## 2015-07-25 DIAGNOSIS — M79673 Pain in unspecified foot: Secondary | ICD-10-CM | POA: Diagnosis not present

## 2015-07-25 DIAGNOSIS — Z79899 Other long term (current) drug therapy: Secondary | ICD-10-CM | POA: Diagnosis not present

## 2015-07-25 DIAGNOSIS — E119 Type 2 diabetes mellitus without complications: Secondary | ICD-10-CM | POA: Diagnosis not present

## 2015-07-25 DIAGNOSIS — M549 Dorsalgia, unspecified: Secondary | ICD-10-CM | POA: Diagnosis not present

## 2015-07-25 DIAGNOSIS — M0589 Other rheumatoid arthritis with rheumatoid factor of multiple sites: Secondary | ICD-10-CM | POA: Diagnosis not present

## 2015-07-25 DIAGNOSIS — M06841 Other specified rheumatoid arthritis, right hand: Secondary | ICD-10-CM | POA: Diagnosis not present

## 2015-07-25 DIAGNOSIS — I10 Essential (primary) hypertension: Secondary | ICD-10-CM | POA: Diagnosis not present

## 2015-07-25 DIAGNOSIS — M06842 Other specified rheumatoid arthritis, left hand: Secondary | ICD-10-CM | POA: Diagnosis not present

## 2015-10-31 DIAGNOSIS — M25539 Pain in unspecified wrist: Secondary | ICD-10-CM | POA: Diagnosis not present

## 2015-10-31 DIAGNOSIS — M0579 Rheumatoid arthritis with rheumatoid factor of multiple sites without organ or systems involvement: Secondary | ICD-10-CM | POA: Diagnosis not present

## 2015-10-31 DIAGNOSIS — Z79899 Other long term (current) drug therapy: Secondary | ICD-10-CM | POA: Diagnosis not present

## 2015-11-13 DIAGNOSIS — M0579 Rheumatoid arthritis with rheumatoid factor of multiple sites without organ or systems involvement: Secondary | ICD-10-CM | POA: Diagnosis not present

## 2015-11-13 DIAGNOSIS — M25539 Pain in unspecified wrist: Secondary | ICD-10-CM | POA: Diagnosis not present

## 2015-11-16 DIAGNOSIS — E119 Type 2 diabetes mellitus without complications: Secondary | ICD-10-CM | POA: Diagnosis not present

## 2015-11-16 DIAGNOSIS — E782 Mixed hyperlipidemia: Secondary | ICD-10-CM | POA: Diagnosis not present

## 2015-11-16 DIAGNOSIS — I10 Essential (primary) hypertension: Secondary | ICD-10-CM | POA: Diagnosis not present

## 2015-11-23 DIAGNOSIS — I10 Essential (primary) hypertension: Secondary | ICD-10-CM | POA: Diagnosis not present

## 2015-11-23 DIAGNOSIS — E782 Mixed hyperlipidemia: Secondary | ICD-10-CM | POA: Diagnosis not present

## 2015-11-23 DIAGNOSIS — E119 Type 2 diabetes mellitus without complications: Secondary | ICD-10-CM | POA: Diagnosis not present

## 2015-11-27 DIAGNOSIS — M79673 Pain in unspecified foot: Secondary | ICD-10-CM | POA: Diagnosis not present

## 2015-11-27 DIAGNOSIS — M0589 Other rheumatoid arthritis with rheumatoid factor of multiple sites: Secondary | ICD-10-CM | POA: Diagnosis not present

## 2015-11-27 DIAGNOSIS — Z79899 Other long term (current) drug therapy: Secondary | ICD-10-CM | POA: Diagnosis not present

## 2015-11-27 DIAGNOSIS — M549 Dorsalgia, unspecified: Secondary | ICD-10-CM | POA: Diagnosis not present

## 2015-12-15 ENCOUNTER — Inpatient Hospital Stay (HOSPITAL_COMMUNITY)
Admission: EM | Admit: 2015-12-15 | Discharge: 2015-12-21 | DRG: 394 | Disposition: A | Discharge status: Home Health | Payer: Medicare Other | Attending: Internal Medicine | Admitting: Internal Medicine

## 2015-12-15 ENCOUNTER — Encounter (HOSPITAL_COMMUNITY): Payer: Self-pay | Admitting: *Deleted

## 2015-12-15 ENCOUNTER — Encounter (HOSPITAL_COMMUNITY)
Admission: EM | Disposition: A | Discharge status: Home Health | Payer: Self-pay | Source: Home / Self Care | Attending: Internal Medicine

## 2015-12-15 ENCOUNTER — Emergency Department (HOSPITAL_COMMUNITY): Payer: Medicare Other

## 2015-12-15 ENCOUNTER — Inpatient Hospital Stay (HOSPITAL_COMMUNITY): Payer: Medicare Other

## 2015-12-15 DIAGNOSIS — K6389 Other specified diseases of intestine: Secondary | ICD-10-CM | POA: Diagnosis not present

## 2015-12-15 DIAGNOSIS — R933 Abnormal findings on diagnostic imaging of other parts of digestive tract: Secondary | ICD-10-CM | POA: Diagnosis not present

## 2015-12-15 DIAGNOSIS — R52 Pain, unspecified: Secondary | ICD-10-CM | POA: Diagnosis not present

## 2015-12-15 DIAGNOSIS — D649 Anemia, unspecified: Secondary | ICD-10-CM | POA: Diagnosis present

## 2015-12-15 DIAGNOSIS — K5939 Other megacolon: Secondary | ICD-10-CM

## 2015-12-15 DIAGNOSIS — Z4682 Encounter for fitting and adjustment of non-vascular catheter: Secondary | ICD-10-CM | POA: Diagnosis not present

## 2015-12-15 DIAGNOSIS — K559 Vascular disorder of intestine, unspecified: Principal | ICD-10-CM | POA: Diagnosis present

## 2015-12-15 DIAGNOSIS — D72829 Elevated white blood cell count, unspecified: Secondary | ICD-10-CM | POA: Diagnosis present

## 2015-12-15 DIAGNOSIS — K567 Ileus, unspecified: Secondary | ICD-10-CM | POA: Diagnosis present

## 2015-12-15 DIAGNOSIS — Z91040 Latex allergy status: Secondary | ICD-10-CM | POA: Diagnosis not present

## 2015-12-15 DIAGNOSIS — Z8249 Family history of ischemic heart disease and other diseases of the circulatory system: Secondary | ICD-10-CM | POA: Diagnosis not present

## 2015-12-15 DIAGNOSIS — K5730 Diverticulosis of large intestine without perforation or abscess without bleeding: Secondary | ICD-10-CM | POA: Diagnosis present

## 2015-12-15 DIAGNOSIS — E876 Hypokalemia: Secondary | ICD-10-CM | POA: Diagnosis present

## 2015-12-15 DIAGNOSIS — K56609 Unspecified intestinal obstruction, unspecified as to partial versus complete obstruction: Secondary | ICD-10-CM | POA: Diagnosis present

## 2015-12-15 DIAGNOSIS — Z9049 Acquired absence of other specified parts of digestive tract: Secondary | ICD-10-CM

## 2015-12-15 DIAGNOSIS — K560 Paralytic ileus: Secondary | ICD-10-CM | POA: Diagnosis not present

## 2015-12-15 DIAGNOSIS — K5660 Unspecified intestinal obstruction: Secondary | ICD-10-CM | POA: Diagnosis not present

## 2015-12-15 DIAGNOSIS — R1084 Generalized abdominal pain: Secondary | ICD-10-CM | POA: Insufficient documentation

## 2015-12-15 DIAGNOSIS — I10 Essential (primary) hypertension: Secondary | ICD-10-CM | POA: Diagnosis present

## 2015-12-15 DIAGNOSIS — M069 Rheumatoid arthritis, unspecified: Secondary | ICD-10-CM | POA: Diagnosis present

## 2015-12-15 DIAGNOSIS — R112 Nausea with vomiting, unspecified: Secondary | ICD-10-CM | POA: Diagnosis not present

## 2015-12-15 DIAGNOSIS — C186 Malignant neoplasm of descending colon: Secondary | ICD-10-CM

## 2015-12-15 DIAGNOSIS — R1030 Lower abdominal pain, unspecified: Secondary | ICD-10-CM | POA: Diagnosis not present

## 2015-12-15 DIAGNOSIS — Z79899 Other long term (current) drug therapy: Secondary | ICD-10-CM | POA: Diagnosis not present

## 2015-12-15 DIAGNOSIS — K5790 Diverticulosis of intestine, part unspecified, without perforation or abscess without bleeding: Secondary | ICD-10-CM | POA: Insufficient documentation

## 2015-12-15 DIAGNOSIS — K5900 Constipation, unspecified: Secondary | ICD-10-CM | POA: Diagnosis not present

## 2015-12-15 DIAGNOSIS — K5669 Other intestinal obstruction: Secondary | ICD-10-CM | POA: Diagnosis not present

## 2015-12-15 DIAGNOSIS — K55039 Acute (reversible) ischemia of large intestine, extent unspecified: Secondary | ICD-10-CM | POA: Diagnosis not present

## 2015-12-15 DIAGNOSIS — R140 Abdominal distension (gaseous): Secondary | ICD-10-CM | POA: Diagnosis not present

## 2015-12-15 HISTORY — PX: FLEXIBLE SIGMOIDOSCOPY: SHX5431

## 2015-12-15 HISTORY — DX: Unspecified intestinal obstruction, unspecified as to partial versus complete obstruction: K56.609

## 2015-12-15 HISTORY — DX: Rheumatoid arthritis, unspecified: M06.9

## 2015-12-15 HISTORY — DX: Diverticulosis of intestine, part unspecified, without perforation or abscess without bleeding: K57.90

## 2015-12-15 HISTORY — DX: Essential (primary) hypertension: I10

## 2015-12-15 LAB — COMPREHENSIVE METABOLIC PANEL
ALT: 19 U/L (ref 17–63)
AST: 19 U/L (ref 15–41)
Albumin: 4.5 g/dL (ref 3.5–5.0)
Alkaline Phosphatase: 43 U/L (ref 38–126)
Anion gap: 9 (ref 5–15)
BUN: 16 mg/dL (ref 6–20)
CHLORIDE: 103 mmol/L (ref 101–111)
CO2: 27 mmol/L (ref 22–32)
CREATININE: 1.26 mg/dL — AB (ref 0.61–1.24)
Calcium: 9.4 mg/dL (ref 8.9–10.3)
GFR calc non Af Amer: 59 mL/min — ABNORMAL LOW (ref >60)
Glucose, Bld: 142 mg/dL — ABNORMAL HIGH (ref 65–99)
POTASSIUM: 3.5 mmol/L (ref 3.5–5.1)
SODIUM: 139 mmol/L (ref 135–145)
Total Bilirubin: 1.1 mg/dL (ref 0.3–1.2)
Total Protein: 7.9 g/dL (ref 6.5–8.1)

## 2015-12-15 LAB — CBC
HEMATOCRIT: 36 % — AB (ref 39.0–52.0)
Hemoglobin: 11.9 g/dL — ABNORMAL LOW (ref 13.0–17.0)
MCH: 26.9 pg (ref 26.0–34.0)
MCHC: 33.1 g/dL (ref 30.0–36.0)
MCV: 81.3 fL (ref 78.0–100.0)
PLATELETS: 311 10*3/uL (ref 150–400)
RBC: 4.43 MIL/uL (ref 4.22–5.81)
RDW: 14.9 % (ref 11.5–15.5)
WBC: 19.4 10*3/uL — AB (ref 4.0–10.5)

## 2015-12-15 LAB — LIPASE, BLOOD: LIPASE: 32 U/L (ref 11–51)

## 2015-12-15 LAB — LACTIC ACID, PLASMA
Lactic Acid, Venous: 1.3 mmol/L (ref 0.5–1.9)
Lactic Acid, Venous: 1.2 mmol/L (ref 0.5–1.9)

## 2015-12-15 SURGERY — SIGMOIDOSCOPY, FLEXIBLE
Anesthesia: Moderate Sedation

## 2015-12-15 MED ORDER — PIPERACILLIN-TAZOBACTAM 3.375 G IVPB
3.3750 g | Freq: Three times a day (TID) | INTRAVENOUS | Status: DC
  Administered 2015-12-15 – 2015-12-21 (×18): 3.375 g via INTRAVENOUS
  Filled 2015-12-15 (×24): qty 50

## 2015-12-15 MED ORDER — DIATRIZOATE MEGLUMINE & SODIUM 66-10 % PO SOLN
ORAL | Status: AC
  Filled 2015-12-15: qty 30

## 2015-12-15 MED ORDER — LORAZEPAM 2 MG/ML IJ SOLN
0.5000 mg | Freq: Four times a day (QID) | INTRAMUSCULAR | Status: DC | PRN
Start: 2015-12-15 — End: 2015-12-19

## 2015-12-15 MED ORDER — HYDROMORPHONE HCL 1 MG/ML IJ SOLN
1.0000 mg | Freq: Once | INTRAMUSCULAR | Status: AC
  Administered 2015-12-15: 1 mg via INTRAVENOUS
  Filled 2015-12-15: qty 1

## 2015-12-15 MED ORDER — ONDANSETRON HCL 4 MG/2ML IJ SOLN
4.0000 mg | Freq: Once | INTRAMUSCULAR | Status: AC
  Administered 2015-12-15: 4 mg via INTRAVENOUS
  Filled 2015-12-15: qty 2

## 2015-12-15 MED ORDER — ALBUTEROL SULFATE (2.5 MG/3ML) 0.083% IN NEBU: 3.0000 mL | INHALATION_SOLUTION | Freq: Four times a day (QID) | RESPIRATORY_TRACT | Status: DC | PRN

## 2015-12-15 MED ORDER — FENTANYL CITRATE (PF) 100 MCG/2ML IJ SOLN
INTRAMUSCULAR | Status: DC | PRN
  Administered 2015-12-15: 25 ug via INTRAVENOUS

## 2015-12-15 MED ORDER — FENTANYL CITRATE (PF) 100 MCG/2ML IJ SOLN
INTRAMUSCULAR | Status: AC
Start: 2015-12-15 — End: 2015-12-16
  Filled 2015-12-15: qty 2

## 2015-12-15 MED ORDER — HYDROMORPHONE HCL 1 MG/ML IJ SOLN
1.0000 mg | INTRAMUSCULAR | Status: DC | PRN
  Administered 2015-12-15 – 2015-12-16 (×6): 1 mg via INTRAVENOUS
  Filled 2015-12-15 (×6): qty 1

## 2015-12-15 MED ORDER — SODIUM CHLORIDE 0.9 % IV SOLN: INTRAVENOUS | Status: DC

## 2015-12-15 MED ORDER — MIDAZOLAM HCL 5 MG/5ML IJ SOLN
INTRAMUSCULAR | Status: DC | PRN
  Administered 2015-12-15: 2 mg via INTRAVENOUS

## 2015-12-15 MED ORDER — STERILE WATER FOR IRRIGATION IR SOLN
Status: DC | PRN
  Administered 2015-12-15: 20:00:00

## 2015-12-15 MED ORDER — SODIUM CHLORIDE 0.9 % IV BOLUS (SEPSIS): 1000.0000 mL | Freq: Once | INTRAVENOUS | Status: DC

## 2015-12-15 MED ORDER — PROMETHAZINE HCL 25 MG PO TABS: 12.5000 mg | ORAL_TABLET | Freq: Four times a day (QID) | ORAL | Status: DC | PRN

## 2015-12-15 MED ORDER — LIDOCAINE HCL 2 % EX GEL
CUTANEOUS | Status: AC
  Filled 2015-12-15: qty 10

## 2015-12-15 MED ORDER — ATROPINE SULFATE 1 MG/ML IJ SOLN
INTRAMUSCULAR | Status: AC
  Filled 2015-12-15: qty 1

## 2015-12-15 MED ORDER — POTASSIUM CHLORIDE IN NACL 20-0.9 MEQ/L-% IV SOLN
INTRAVENOUS | Status: DC
  Administered 2015-12-15: 22:00:00 via INTRAVENOUS

## 2015-12-15 MED ORDER — MIDAZOLAM HCL 5 MG/5ML IJ SOLN
INTRAMUSCULAR | Status: AC
  Filled 2015-12-15: qty 5

## 2015-12-15 MED ORDER — EPINEPHRINE HCL 0.1 MG/ML IJ SOSY
PREFILLED_SYRINGE | INTRAMUSCULAR | Status: AC
  Filled 2015-12-15: qty 10

## 2015-12-15 MED ORDER — MORPHINE SULFATE (PF) 2 MG/ML IV SOLN
2.0000 mg | Freq: Once | INTRAVENOUS | Status: AC
  Administered 2015-12-15: 2 mg via INTRAVENOUS
  Filled 2015-12-15: qty 1

## 2015-12-15 MED ORDER — NALOXONE HCL 0.4 MG/ML IJ SOLN
INTRAMUSCULAR | Status: AC
  Filled 2015-12-15: qty 1

## 2015-12-15 MED ORDER — FLUMAZENIL 0.5 MG/5ML IV SOLN
INTRAVENOUS | Status: AC
  Filled 2015-12-15: qty 5

## 2015-12-15 MED ORDER — METOPROLOL TARTRATE 5 MG/5ML IV SOLN
5.0000 mg | Freq: Four times a day (QID) | INTRAVENOUS | Status: DC
  Administered 2015-12-16 – 2015-12-19 (×14): 5 mg via INTRAVENOUS
  Filled 2015-12-15 (×14): qty 5

## 2015-12-15 MED ORDER — KETOCONAZOLE 2 % EX CREA
1.0000 "application " | TOPICAL_CREAM | Freq: Every day | CUTANEOUS | Status: DC | PRN
  Filled 2015-12-15: qty 15

## 2015-12-15 MED ORDER — HYDRALAZINE HCL 20 MG/ML IJ SOLN: 5.0000 mg | INTRAMUSCULAR | Status: DC | PRN

## 2015-12-15 MED ORDER — ACETAMINOPHEN 325 MG PO TABS: 650.0000 mg | ORAL_TABLET | Freq: Four times a day (QID) | ORAL | Status: DC | PRN

## 2015-12-15 MED ORDER — SODIUM CHLORIDE 0.9 % IV BOLUS (SEPSIS)
1000.0000 mL | Freq: Once | INTRAVENOUS | Status: AC
  Administered 2015-12-15: 1000 mL via INTRAVENOUS

## 2015-12-15 MED ORDER — ACETAMINOPHEN 650 MG RE SUPP: 650.0000 mg | Freq: Four times a day (QID) | RECTAL | Status: DC | PRN

## 2015-12-15 MED ORDER — ENOXAPARIN SODIUM 40 MG/0.4ML ~~LOC~~ SOLN: 40.0000 mg | SUBCUTANEOUS | Status: DC

## 2015-12-15 NOTE — Consult Note (Signed)
Referring Provider: ED provider Primary Care Physician:  [facility] Primary Gastroenterologist:  Dr. Oneida Alar  Date of Admission: 12/15/15 Date of Consultation: 12/15/15  Reason for Consultation:  Abdominal pain, concern for bowel obstruction vs volvulus  HPI:  Kevin Lopez is a 65 y.o. male with a past medical history of diverticulosis, hypertension, RA and appendectomy presented tot he ED with complaints of generalized abdominal pain, no BM in 4 days, N/V since about the same time. Last BM was dark (per nursing triage notes.)   Today he states he has a history colcolon obstructions in the past, at one point requiring colon resection (about 7 years ago). He confirms the above history. States abdominal pain is significant, as is N/V. Attempted oral contrast for CT but vomited it back up. Denies hematochezia or melena prior to onset of symptoms. No other upper or lower GI symptoms. Is fatigued and weak. Last po intake attempted smoothie yesterday but vomited it back up. Attwempted enemas and magnesium citrate at home without results.  Past Medical History:  Diagnosis Date  . Diverticulosis   . Hypertension   . RA (rheumatoid arthritis) ([place])     Past Surgical History:  Procedure Laterality Date  . APPENDECTOMY    . COLON SURGERY    . KNEE SURGERY      Prior to Admission medications   Not on File    Current Facility-Administered Medications  Medication Dose Route Frequency Provider Last Rate Last Dose  . diatrizoate meglumine-sodium (GASTROGRAFIN) 66-10 % solution           . morphine 2 MG/ML injection 2 mg  2 mg Intravenous Once [employer], PA-C      . sodium chloride 0.9 % bolus 1,000 mL  1,000 mL Intravenous Once Ezequiel Essex, MD       No current outpatient prescriptions on file.    Allergies as of 12/15/2015 - Review Complete 12/15/2015  Allergen Reaction Noted  . Latex Shortness Of Breath 12/15/2015    No family history on  file.  Social History   Social History  . Marital status: Single    Spouse name: N/A  . Number of children: N/A  . Years of education: N/A   Occupational History  . Not on file.   Social History Main Topics  . Smoking status: Never Smoker  . Smokeless tobacco: Never Used  . Alcohol use Yes     Comment: occ.   . Drug use: No  . Sexual activity: Not on file   Other Topics Concern  . Not on file   Social History Narrative  . No narrative on file    Review of Systems: 10-point ROS negative except as per HPI.  Physical Exam: Vital signs in last 24 hours: Temp:  [98.1 F (36.7 C)] 98.1 F (36.7 C) (07/28 0943) Pulse Rate:  [79-81] 79 (07/28 1030) Resp:  [14-17] 14 (07/28 1030) BP: (157-162)/(87-89) 157/87 (07/28 1030) SpO2:  [99 %-100 %] 99 % (07/28 1030) Weight:  [174 lb (78.9 kg)] 174 lb (78.9 kg) (07/28 0943)   General:   Alert,  Well-developed, well-nourished, pleasant and cooperative in NAD. Appears in pain. Head:  Normocephalic and atraumatic. Eyes:  Sclera clear, no icterus.Conjunctiva pink. Ears:  Normal auditory acuity. Neck:  Supple; no masses or thyromegaly. Lungs:  Clear throughout to auscultation. No wheezes, crackles, or rhonchi. No acute distress. Heart:  Regular rate and rhythm; no murmurs, clicks, rubs,  or gallops. Abdomen:  Soft, distended, firm, significant increased  TTP.Normal bowel sounds.   Rectal:  Deferred.   Msk:  Symmetrical without gross deformities. Pulses:  Normal bilateral DP pulses noted. Extremities:  Without clubbing or edema. Neurologic:  Alert and  oriented x4;  grossly normal neurologically. Psych:  Alert and cooperative. Normal mood and affect.  Intake/Output from previous day: No intake/output data recorded. Intake/Output this shift: No intake/output data recorded.  Lab Results:  Recent Labs  12/15/15 0947  WBC 19.4*  HGB 11.9*  HCT 36.0*  PLT 311   BMET  Recent Labs  12/15/15 0947  NA 139  K 3.5  CL 103   CO2 27  GLUCOSE 142*  BUN 16  CREATININE 1.26*  CALCIUM 9.4   LFT  Recent Labs  12/15/15 0947  PROT 7.9  ALBUMIN 4.5  AST 19  ALT 19  ALKPHOS 43  BILITOT 1.1   PT/INR No results for input(s): LABPROT, INR in the last 72 hours. Hepatitis Panel No results for input(s): HEPBSAG, HCVAB, HEPAIGM, HEPBIGM in the last 72 hours. C-Diff No results for input(s): CDIFFTOX in the last 72 hours.  Studies/Results: Dg Abd Acute W/chest  Addendum Date: 12/15/2015   ADDENDUM REPORT: 12/15/2015 11:09 ADDENDUM: The dilated bowel in the left upper abdomen potentially could represent a focus of volvulated sigmoid colon. Given this possibility, correlation with CT advised. These results were called by telephone at the time of interpretation on 12/15/2015 at 11:08 am to [facility] , who verbally acknowledged these results. Electronically Signed   By: Lowella Grip III M.D.   On: 12/15/2015 11:09  Result Date: 12/15/2015 CLINICAL DATA:  Generalized abdominal pain and distention with vomiting for 4 days EXAM: DG ABDOMEN ACUTE W/ 1V CHEST COMPARISON:  Abdomen series May 24, 2010 FINDINGS: PA chest: No edema or consolidation. Heart size and pulmonary vascularity are normal. No adenopathy. Supine and upright abdomen: There are multiple loops of dilated colon with multiple air-fluid levels. No free air evident. There are small phleboliths in the pelvis. IMPRESSION: Loops of dilated colon with multiple air-fluid levels. This appearance is concerning for distal bowel obstruction. Ileus is a differential consideration. No free air evident. Lungs clear. Electronically Signed: By: Lowella Grip III M.D. On: 12/15/2015 11:02   Impression: 65 year old male with c/o abdominal pain, n/V, no BM in 4 days. XRay with loops of dilated colon and multiple air-fluid levels concerning for distal bowel obstruction vs ileus. Addedum notes possible sigmoid volvulus and advised CT.  CT abdomen ordered, spoke w/  CT tech and they will try and have the CT done as soon as possible.  Labs show normal lipase, CMP normal except baseline elevated Cr 1.26. CBC w/ leucocytosis WBC 19.4, hgb within baseline at 11.9.   Today he appears to be in significant pain, just received pain medication prior to my entry to talk with him. Severe pain with palpation, distended abdomen more firm than normal. Unable to tolerate po intake over the past few days. No BM in 4 days, despite laxatives/enema.  CT ordered, not done yet. Requested rectal contrast to better determine if there's a volvulus.    Plan: 1. NPO 2. CT as soon as feesible/possible 3. Further management recommendations pending CT results. 4. Pain and nausea/vomiting management 5. Supportive measures    Walden Field, AGNP-C Adult & Gerontological Nurse Practitioner [facility]    LOS: 0 days     12/15/2015, 12:09 PM

## 2015-12-15 NOTE — H&P (Addendum)
History and Physical  Kevin Lopez [id number] DOB: 01/20/1951 DOA: 12/15/2015  Referring physician: Dr Wyvonnia Dusky, ED physician PCP: [facility]  Outpatient Specialists:  none  Chief Complaint: Abdominal pain  HPI: Kevin Lopez is a 65 y.o. 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 Patient did have a colostomy at one point which was reversed. The patient has had several episodes of bowel obstruction in the past, including one approximately 5 years ago. The patient's abdominal pain started 4 days ago and is located throughout the patient's abdomen. Patient has been fairly obstipated - hasn't passed stool or flatus over the past 4 days.  Additionally, he has been nauseated and has vomited many times over the past 4 days. He has been able to keep food or liquid down. Emesis is stomach contents and bile. Does admit to having a fever yesterday to 102. His symptoms are worsening. Radiation of pain into his groin and back.   Review of Systems:   Pt denies any fevers, chills, nausea, vomiting, diarrhea, constipation, abdominal pain, shortness of breath, dyspnea on exertion, orthopnea, cough, wheezing, palpitations, headache, vision changes, lightheadedness, dizziness, melena, rectal bleeding.  Review of systems are otherwise negative  Past Medical History:  Diagnosis Date  . Diverticulosis   . Hypertension   . RA (rheumatoid arthritis) ([place])    Past Surgical History:  Procedure Laterality Date  . APPENDECTOMY    . COLON SURGERY    . KNEE SURGERY     Social History:  reports that he has never smoked. He has never used smokeless tobacco. He reports that he drinks alcohol. He reports that he does not use drugs. Patient lives at [place]  . Latex Shortness Of Breath     Family History  Problem Relation Age of Onset  . Heart attack Father   . Cancer Mother   . Hypertension Brother   . Hypertension Sister      Prior to Admission medications   Medication Sig Start Date End Date Taking? Authorizing Provider  Abatacept (ORENCIA) 125 MG/ML SOSY Inject 125 mg into the skin once a week. On Thursday.   Yes Historical Provider, MD  adapalene (DIFFERIN) 0.1 % cream Apply 1 application topically daily as needed. 11/23/15  Yes Historical Provider, MD  albuterol (PROVENTIL HFA;VENTOLIN HFA) 108 (90 Base) MCG/ACT inhaler Inhale 2 puffs into the lungs every 6 (six) hours as needed for wheezing or shortness of breath.   Yes Historical Provider, MD  amLODipine (NORVASC) 10 MG tablet Take 10 mg by mouth daily.   Yes Historical Provider, MD  clonazePAM (KLONOPIN) 0.5 MG tablet Take 0.5 mg by mouth 2 (two) times daily as needed for anxiety.  12/04/15  Yes Historical Provider, MD  ketoconazole (NIZORAL) 2 % cream Apply 1 application topically daily as needed for irritation.  11/22/15  Yes Historical Provider, MD  Multiple Vitamin (MULTIVITAMIN WITH MINERALS) TABS tablet Take 1 tablet by mouth daily.   Yes Historical Provider, MD  Naproxen Sod-Diphenhydramine (ALEVE PM) 220-25 MG TABS Take 1 tablet by mouth at bedtime as needed (pain/sleep).   Yes Historical Provider, MD  telmisartan (MICARDIS) 80 MG tablet Take 80 mg by mouth daily.   Yes Historical Provider, MD  zolpidem (AMBIEN CR) 12.5 MG CR tablet Take 12.5 mg by mouth at bedtime as needed for sleep.   Yes Historical Provider, MD  Physical Exam: BP 141/81 (BP Location: Left Arm)   Pulse 77   Temp 98.1 F (36.7 C) (Oral)   Resp 17   Ht 5\' 9"  (1.753 m)   Wt 78.9 kg (174 lb)   SpO2 100%   BMI 25.70 kg/m   General: Older black male. Awake and alert and oriented x3. No acute cardiopulmonary distress.  HEENT: Normocephalic atraumatic.  Right and left ears normal in appearance.  Pupils equal, round, reactive to light.  Extraocular muscles are intact. Sclerae anicteric and noninjected.  Moist mucosal membranes. No mucosal lesions.  Neck: Neck supple without lymphadenopathy. No carotid bruits. No masses palpated.  Cardiovascular: Regular rate with normal S1-S2 sounds. No murmurs, rubs, gallops auscultated. No JVD.  Respiratory: Good respiratory effort with no wheezes, rales, rhonchi. Lungs clear to auscultation bilaterally.  No accessory muscle use. Abdomen: Distended abdomen that is diffusely tender. He does have rebound tenderness and guarding - NG tube had not been placed at this point. Skin: No rashes, lesions, or ulcerations.  Dry, warm to touch. 2+ dorsalis pedis and radial pulses. Musculoskeletal: No calf or leg pain. All major joints not erythematous nontender.  No upper or lower joint deformation.  Good ROM.  No contractures  Psychiatric: Intact judgment and insight. Pleasant and cooperative. Neurologic: No focal neurological deficits. Strength is 5/5 and symmetric in upper and lower extremities.  Cranial nerves II through XII are grossly intact.           Labs on Admission: I have personally reviewed following labs and imaging studies  CBC:  Recent Labs Lab 12/15/15 0947  WBC 19.4*  HGB 11.9*  HCT 36.0*  MCV 81.3  PLT [id number]   Basic Metabolic Panel:  Recent Labs Lab 12/15/15 0947  NA 139  K 3.5  CL 103  CO2 27  GLUCOSE 142*  BUN 16  CREATININE 1.26*  CALCIUM 9.4   GFR: Estimated Creatinine Clearance: 60 mL/min (by C-G formula based on SCr of 1.26 mg/dL). Liver Function Tests:  Recent Labs Lab 12/15/15 0947  AST 19  ALT 19  ALKPHOS 43  BILITOT 1.1  PROT 7.9  ALBUMIN 4.5    Recent Labs Lab 12/15/15 0947  LIPASE 32   No results for input(s): AMMONIA in the last 168 hours. Coagulation Profile: No results for input(s): INR, PROTIME in the last 168 hours. Cardiac Enzymes: No results for input(s): CKTOTAL, CKMB, CKMBINDEX, TROPONINI in the last 168 hours. BNP (last 3  results) No results for input(s): PROBNP in the last 8760 hours. HbA1C: No results for input(s): HGBA1C in the last 72 hours. CBG: No results for input(s): GLUCAP in the last 168 hours. Lipid Profile: No results for input(s): CHOL, HDL, LDLCALC, TRIG, CHOLHDL, LDLDIRECT in the last 72 hours. Thyroid Function Tests: No results for input(s): TSH, T4TOTAL, FREET4, T3FREE, THYROIDAB in the last 72 hours. Anemia Panel: No results for input(s): VITAMINB12, FOLATE, FERRITIN, TIBC, IRON, RETICCTPCT in the last 72 hours. Urine analysis:    Component Value Date/Time   COLORURINE YELLOW 05/21/2010 1505   APPEARANCEUR CLEAR 05/21/2010 1505   LABSPEC 1.020 05/21/2010 1505   PHURINE 8.5 (H) 05/21/2010 1505   GLUCOSEU NEGATIVE 05/21/2010 1505   HGBUR NEGATIVE 05/21/2010 1505   BILIRUBINUR NEGATIVE 05/21/2010 1505   KETONESUR NEGATIVE 05/21/2010 1505   PROTEINUR 100 (A) 05/21/2010 1505   UROBILINOGEN 0.2 05/21/2010 1505   NITRITE NEGATIVE 05/21/2010 1505   LEUKOCYTESUR NEGATIVE 05/21/2010 1505   Sepsis Labs: @LABRCNTIP (procalcitonin:4,lacticidven:4) )No results found for this or  any previous visit (from the past 240 hour(s)).   Radiological Exams on Admission: Ct Abdomen Pelvis Wo Contrast  Result Date: 12/15/2015 CLINICAL DATA:  Generalized abdominal pain. EXAM: CT ABDOMEN AND PELVIS WITHOUT CONTRAST TECHNIQUE: Multidetector CT imaging of the abdomen and pelvis was performed following the standard protocol without IV contrast. COMPARISON:  Plain films 12/15/2015.  CT 05/21/2010 FINDINGS: Lower chest: Dependent atelectasis at the right base. Elevation of the right hemidiaphragm. Left lung base is clear. Heart is borderline in size. No effusions. Small hiatal hernia. Hepatobiliary: No focal hepatic abnormality. Gallbladder unremarkable. Pancreas: No focal abnormality or ductal dilatation. Spleen: No focal abnormality.  Normal size. Adrenals/Urinary Tract: No adrenal abnormality. No focal renal  abnormality. No stones or hydronephrosis. Urinary bladder is unremarkable. Stomach/Bowel: Rectal contrast was administered. Significant gaseous distention of the colon, most pronounced in the transverse colon which measures up to 11 cm. Moderate stool burden in the colon. Small bowel and stomach decompressed. No visible obstructing colonic lesion. Vascular/Lymphatic: Scattered aortic and iliac calcifications. No aneurysm. No adenopathy. Reproductive:  No visible focal abnormality. Other: Small amount of free fluid in the right side of the pelvis. No free air. Musculoskeletal: No acute bony abnormality or focal bone lesion IMPRESSION: Marked gaseous distention of the colon with moderate stool burden. No visible obstructing process. Transverse colon measures up to 11 cm in diameter. Small hiatal hernia.  Right base atelectasis. Small amount of free fluid in the pelvis. Electronically Signed   By: Rolm Baptise M.D.   On: 12/15/2015 13:29  Dg Abd Acute W/chest  Addendum Date: 12/15/2015   ADDENDUM REPORT: 12/15/2015 11:09 ADDENDUM: The dilated bowel in the left upper abdomen potentially could represent a focus of volvulated sigmoid colon. Given this possibility, correlation with CT advised. These results were called by telephone at the time of interpretation on 12/15/2015 at 11:08 am to [facility] , who verbally acknowledged these results. Electronically Signed   By: Lowella Grip III M.D.   On: 12/15/2015 11:09  Result Date: 12/15/2015 CLINICAL DATA:  Generalized abdominal pain and distention with vomiting for 4 days EXAM: DG ABDOMEN ACUTE W/ 1V CHEST COMPARISON:  Abdomen series May 24, 2010 FINDINGS: PA chest: No edema or consolidation. Heart size and pulmonary vascularity are normal. No adenopathy. Supine and upright abdomen: There are multiple loops of dilated colon with multiple air-fluid levels. No free air evident. There are small phleboliths in the pelvis. IMPRESSION: Loops of dilated colon  with multiple air-fluid levels. This appearance is concerning for distal bowel obstruction. Ileus is a differential consideration. No free air evident. Lungs clear. Electronically Signed: By: Lowella Grip III M.D. On: 12/15/2015 11:02    Assessment/Plan: Principal Problem:   Bowel obstruction (HCC) Active Problems:   Hypertension   Leukocytosis    This patient was discussed with the ED physician, including pertinent vitals, physical exam findings, labs, and imaging.  We also discussed care given by the ED provider.  #1 bowel obstruction  Admit  NG tube placed after my interview - put to low intermittent suction  Abd xray to confirm placement.  IV fluids: Normal saline with 20 mEq of potassium at 125 ML's per hour  Pain control  Nothing by mouth  Appreciate GI input  Gen. surgery to see  Repeat CMP in the morning #2 hypertension  By mouth medications  Metoprolol 5 mg IV every 6 hours  Hydralazine 5 mg IV when necessary #3 leukocytosis  Likely secondary to bowel obstruction  No evidence  of infection at this point  We'll defer to general surgery to start antibiotics if they deem necessary  Repeat CBC in the morning  DVT prophylaxis: Lovenox Consultants: GI, general surgery Code Status: Full code Family Communication: None  Disposition Plan: Likely discharged to home following admission   Truett Mainland, DO [facility] Pager [phone]  If 7PM-7AM, please contact night-coverage [email or web address] Password [email or web address]   Due to colonic ischemia, will transfer patient to [facility] stepdown.  Start zosyn.  I consulted Dr Barry Dienes, who agreed to consult on the patient upon arrival.  Truett Mainland, DO 12/15/2015 9:13 PM

## 2015-12-15 NOTE — ED Notes (Signed)
Report given to [facility] pt ready to go to floor, wife at the bedside

## 2015-12-15 NOTE — Op Note (Signed)
[facility] Patient Name: Kevin Lopez Procedure Date: 12/15/2015 7:14 PM MRN: [id number] Date of Birth: 1950/06/30 Attending MD: [name] , MD CSN: [id number] Age: 65 Admit Type: Inpatient Procedure:                Flexible Sigmoidoscopy, DIAGNOSTIC-UNPREPPED Indications:              Generalized abdominal pain, Abnormal AAS/CT of the                            GI tract, Constipation-NO BM FOR 4 DAYS Providers:                [name], MD, Janeece Riggers, RN, Hinton Rao, RN Referring MD:              Medicines:                Fentanyl 25 micrograms IV, Midazolam 2 mg IV Complications:            No immediate complications. Estimated Blood Loss:     Estimated blood loss: none. Procedure:                Pre-Anesthesia Assessment:                           - Prior to the procedure, a History and Physical                            was performed, and patient medications and                            allergies were reviewed. The patient's tolerance of                            previous anesthesia was also reviewed. The risks                            and benefits of the procedure and the sedation                            options and risks were discussed with the patient.                            All questions were answered, and informed consent                            was obtained. Prior Anticoagulants: The patient has                            taken no previous anticoagulant or antiplatelet                            agents. ASA Grade Assessment: III - A patient with                            severe systemic disease. After reviewing the risks  and benefits, the patient was deemed in                            satisfactory condition to undergo the procedure.                            After obtaining informed consent, the scope was                            passed under direct vision. The [id number]      scope was introduced through the anus and advanced                            to the the descending colon. The flexible                            sigmoidoscopy was technically difficult and complex                            due to poor bowel prep with stool present. The                            quality of the bowel preparation was poor. Scope In: 7:31:52 PM Scope Out: 7:44:37 PM Total Procedure Duration: 0 hours 12 minutes 45 seconds  Findings:      The perianal examination was normal.      Semi-liquid/semi-solid stool was found in the sigmoid colon and in the       descending colon, interfering with visualization. Lavage of the area was       performed using a large amount of tap water, resulting in incomplete       clearance with fair visualization.      The lumen of the sigmoid colon and descending colon was significantly       dilated. UNABLE TO APPRECIATE A VOLVULUS. MUCOSA DEMONSTRATED LINEAR       AREAS THAT ARE DUSKY(BLACK) WITH INTERMITTENT CONGESTION BUT PINK       MUCOSA. THIS FINDING IS ~30 CM ABOVE THE ANAL VERGE. Decompression was       attempted but was unsuccessful. UNABLE TO APPRECIAE SIMILAR CHANGES IN       THR DESCENDING COLON. Impression:               - Preparation of the colon was poor.                           - Stool in the sigmoid colon and in the descending                            colon.                           - Dilated in the sigmoid colon and in the                            descending colon LIKELY DUE TO ISCHEMIA WITH  EVIDENCE OF BOWEL WALL NECROSIS. Moderate Sedation:      Moderate (conscious) sedation was administered by the endoscopy nurse       and supervised by the endoscopist. The following parameters were       monitored: oxygen saturation, heart rate, blood pressure, and response       to care. Total physician intraservice time was 12 minutes. Recommendation:           - NPO. NG TUBE TO INTERMITTENT LOW  WALL SUCTION                           - Zosyn (piperacillin tazobactam) 3.375 gm IV q 6                            hr.                           - Transfer patient to another hospital.                           -[id number]-DISCUSSED WITH DRs. DAVIS AND STINSON.                            DISCUSSED PLAN WITH PT AND GIRLFRIEND. ATTEMPTED TO                            CONTACT DAUGHTER-LVM. Procedure Code(s):        --- Professional ---                           [phone], Sigmoidoscopy, flexible; with decompression                            (for pathologic distention) (eg, volvulus,                            megacolon), including placement of decompression                            tube, when performed                           [id number], Moderate sedation services provided by the                            same physician or other qualified health care                            professional performing the diagnostic or                            therapeutic service that the sedation supports,                            requiring the presence of an independent trained  observer to assist in the monitoring of the                            patient's level of consciousness and physiological                            status; initial 15 minutes of intraservice time,                            patient age 56 years or older Diagnosis Code(s):        --- Professional ---                           K59.39, Other megacolon                           R10.84, Generalized abdominal pain                           K59.00, Constipation, unspecified                           R93.3, Abnormal findings on diagnostic imaging of                            other parts of digestive tract CPT copyright 2016 [employer] Association. All rights reserved. The codes documented in this report are preliminary and upon coder review may  be revised to meet current compliance requirements. [name],  MD [name], MD 12/15/2015 8:38:52 PM This report has been signed electronically. Number of Addenda: 0

## 2015-12-15 NOTE — H&P (Addendum)
Primary Care Physician:  [facility] Primary Gastroenterologist:  Dr. Oneida Alar  Pre-Procedure History & Physical: HPI:  Kevin Lopez is a 65 y.o. male here for DISTENDED COLON: TC > 10 cm.  Past Medical History:  Diagnosis Date  . Diverticulosis   . Hypertension   . RA (rheumatoid arthritis) ([place])     Past Surgical History:  Procedure Laterality Date  . APPENDECTOMY    . COLON SURGERY    . KNEE SURGERY      Prior to Admission medications   Medication Sig Start Date End Date Taking? Authorizing Provider  Abatacept (ORENCIA) 125 MG/ML SOSY Inject 125 mg into the skin once a week. On Thursday.   Yes Historical Provider, MD  adapalene (DIFFERIN) 0.1 % cream Apply 1 application topically daily as needed. 11/23/15  Yes Historical Provider, MD  albuterol (PROVENTIL HFA;VENTOLIN HFA) 108 (90 Base) MCG/ACT inhaler Inhale 2 puffs into the lungs every 6 (six) hours as needed for wheezing or shortness of breath.   Yes Historical Provider, MD  amLODipine (NORVASC) 10 MG tablet Take 10 mg by mouth daily.   Yes Historical Provider, MD  clonazePAM (KLONOPIN) 0.5 MG tablet Take 0.5 mg by mouth 2 (two) times daily as needed for anxiety.  12/04/15  Yes Historical Provider, MD  ketoconazole (NIZORAL) 2 % cream Apply 1 application topically daily as needed for irritation.  11/22/15  Yes Historical Provider, MD  Multiple Vitamin (MULTIVITAMIN WITH MINERALS) TABS tablet Take 1 tablet by mouth daily.   Yes Historical Provider, MD  Naproxen Sod-Diphenhydramine (ALEVE PM) 220-25 MG TABS Take 1 tablet by mouth at bedtime as needed (pain/sleep).   Yes Historical Provider, MD  telmisartan (MICARDIS) 80 MG tablet Take 80 mg by mouth daily.   Yes Historical Provider, MD  zolpidem (AMBIEN CR) 12.5 MG CR tablet Take 12.5 mg by mouth at bedtime as needed for sleep.   Yes Historical Provider, MD    Allergies as of 12/15/2015 - Review Complete 12/15/2015  Allergen Reaction Noted  . Latex Shortness  Of Breath 12/15/2015    Family History  Problem Relation Age of Onset  . Heart attack Father   . Cancer Mother   . Hypertension Brother   . Hypertension Sister     Social History   Social History  . Marital status: Single    Spouse name: N/A  . Number of children: N/A  . Years of education: N/A   Occupational History  . Not on file.   Social History Main Topics  . Smoking status: Never Smoker  . Smokeless tobacco: Never Used  . Alcohol use Yes     Comment: occ.   . Drug use: No  . Sexual activity: Not on file   Other Topics Concern  . Not on file   Social History Narrative  . No narrative on file    Review of Systems: See HPI, otherwise negative ROS   Physical Exam: BP (!) 167/83 (BP Location: Left Arm)   Pulse 74   Temp 98.1 F (36.7 C) (Oral)   Resp 20   Ht 5\' 9"  (1.753 m)   Wt 167 lb 1.7 oz (75.8 kg)   SpO2 100%   BMI 24.68 kg/m  General:   Alert,  IN AD Head:  Normocephalic and atraumatic. Neck:  Supple; Lungs:  Clear throughout to auscultation.    Heart:  Regular rate and rhythm. Abdomen:  Soft, TENDER and distended. Normal bowel sounds, with guarding, and with rebound.   Neurologic:  Alert and  oriented x4;  grossly normal neurologically.  Impression/Plan:    DISTENDED COLON  PLAN: 1. FLEX SIG NOW FOR DECOMPRESSION.

## 2015-12-15 NOTE — Progress Notes (Addendum)
ADDENDUM:  Results of colonoscopy discussed with Dr. Oneida Alar, including sigmoid ischemia of unclear etiology and unsuccessful colonoscopic decompression. Considering colonic ischemia and need for multidisciplinary care including critical care not available at [facility], recommend avoid hypotension and transfer to [facility]. These recommendations were discussed with admitting medical team. Please call if any questions.  -- Marilynne Drivers Rosana Hoes, MD, [place]: [place] [facility] Office #: [phone]

## 2015-12-15 NOTE — Consult Note (Addendum)
SURGICAL CONSULTATION NOTE (initial)  HISTORY OF PRESENT ILLNESS (HPI):  65 y.o. male presented with worsening generalized abdominal pain, distention, nausea, vomiting, and a lack of bowel movements or flatus x4 days. Patient states that he experienced similar 5 years ago that was treated with non-operative management (NGT), but that the pain is worse today. Patient also reports fever of 101 at home and SOB attributed to abdominal pain, though denies CP.  PAST MEDICAL HISTORY (PMH):  Past Medical History:  Diagnosis Date  . Diverticulosis   . Hypertension   . RA (rheumatoid arthritis) ([place])      PAST SURGICAL HISTORY ([place]):  Past Surgical History:  Procedure Laterality Date  . APPENDECTOMY    . COLON SURGERY    . KNEE SURGERY       MEDICATIONS:  Prior to Admission medications   Medication Sig Start Date End Date Taking? Authorizing Provider  Abatacept (ORENCIA) 125 MG/ML SOSY Inject 125 mg into the skin once a week. On Thursday.   Yes Historical Provider, MD  adapalene (DIFFERIN) 0.1 % cream Apply 1 application topically daily as needed. 11/23/15  Yes Historical Provider, MD  albuterol (PROVENTIL HFA;VENTOLIN HFA) 108 (90 Base) MCG/ACT inhaler Inhale 2 puffs into the lungs every 6 (six) hours as needed for wheezing or shortness of breath.   Yes Historical Provider, MD  amLODipine (NORVASC) 10 MG tablet Take 10 mg by mouth daily.   Yes Historical Provider, MD  clonazePAM (KLONOPIN) 0.5 MG tablet Take 0.5 mg by mouth 2 (two) times daily as needed for anxiety.  12/04/15  Yes Historical Provider, MD  ketoconazole (NIZORAL) 2 % cream Apply 1 application topically daily as needed for irritation.  11/22/15  Yes Historical Provider, MD  Multiple Vitamin (MULTIVITAMIN WITH MINERALS) TABS tablet Take 1 tablet by mouth daily.   Yes Historical Provider, MD  Naproxen Sod-Diphenhydramine (ALEVE PM) 220-25 MG TABS Take 1 tablet by mouth at bedtime as needed (pain/sleep).   Yes Historical Provider, MD   telmisartan (MICARDIS) 80 MG tablet Take 80 mg by mouth daily.   Yes Historical Provider, MD  zolpidem (AMBIEN CR) 12.5 MG CR tablet Take 12.5 mg by mouth at bedtime as needed for sleep.   Yes Historical Provider, MD     ALLERGIES:  Allergies  Allergen Reactions  . Latex Shortness Of Breath     SOCIAL HISTORY:  Social History   Social History  . Marital status: Single    Spouse name: N/A  . Number of children: N/A  . Years of education: N/A   Occupational History  . Not on file.   Social History Main Topics  . Smoking status: Never Smoker  . Smokeless tobacco: Never Used  . Alcohol use Yes     Comment: occ.   . Drug use: No  . Sexual activity: Not on file   Other Topics Concern  . Not on file   Social History Narrative  . No narrative on file    The patient currently resides (home / rehab facility / nursing home): Home  The patient normally is (ambulatory / bedbound): Ambulatory   FAMILY HISTORY:  Family History  Problem Relation Age of Onset  . Heart attack Father   . Cancer Mother   . Hypertension Brother   . Hypertension Sister      REVIEW OF SYSTEMS:  Constitutional: +fever, denies weight loss, chills, or sweats  Eyes: denies any other vision changes, history of eye injury  ENT: denies sore throat, hearing problems  Respiratory: acute shortness of breath attributed to abdominal pain, denies wheezing or prior SOB  Cardiovascular: denies chest pain or palpitations  Gastrointestinal: abdominal pain, N/V, and lack of bowel function x4 days as per HPI  Musculoskeletal: denies any other joint pains or cramps  Skin: denies any other rashes or skin discolorations  Neurological: denies any other headache, dizziness, weakness  Psychiatric: denies any other depression, anxiety   All other review of systems were negative   VITAL SIGNS:  Temp:  [98.1 F (36.7 C)] 98.1 F (36.7 C) (07/28 0943) Pulse Rate:  [77-84] 77 (07/28 1548) Resp:  [12-20] 17 (07/28  1548) BP: (141-162)/(81-91) 141/81 (07/28 1548) SpO2:  [96 %-100 %] 100 % (07/28 1548) Weight:  [78.9 kg (174 lb)] 78.9 kg (174 lb) (07/28 0943)     Height: 5\' 9"  (175.3 cm) Weight: 78.9 kg (174 lb) BMI (Calculated): 25.7   INTAKE/OUTPUT:  This shift: No intake/output data recorded.  Last 2 shifts: @IOLAST2SHIFTS @   PHYSICAL EXAM:  Constitutional:  -- Normal body habitus  -- Awake, alert, and oriented x3  Eyes:  -- Pupils equally round and reactive to light  -- No scleral icterus  Ear, nose, and throat:  -- No jugular venous distension  Pulmonary:  -- No crackles  -- Equal breath sounds bilaterally  Cardiovascular:  -- S1, S2 present  -- No pericardial rubs Abdomen:  -- Severely distended/protuberant with guarding, but without rebound tenderness, non-peritoneal, multiple well-healed incisional scars -- No abdominal masses appreciated, pulsatile or otherwise  Musculoskeletal / Integumentary:  -- Wounds or skin discoloration: None appreciated -- Extremities: B/L UE and LE FROM, hands and feet warm, no edema  Neurologic:  -- Motor function: intact and symmetric -- Sensation: intact and symmetric  Labs:  CBC:  Lab Results  Component Value Date   WBC 19.4 (H) 12/15/2015   RBC 4.43 12/15/2015   BMP:  Lab Results  Component Value Date   GLUCOSE 142 (H) 12/15/2015   CO2 27 12/15/2015   BUN 16 12/15/2015   CREATININE 1.26 (H) 12/15/2015   CALCIUM 9.4 12/15/2015     Imaging studies:  CT Abdomen and Pelvis with attempted Oral and Rectal Contrast, No IV Contrast (12/15/2015) Pancreas: No focal abnormality or ductal dilatation. Stomach/Bowel: Rectal contrast was administered. Significant gaseous distention of the colon, most pronounced in the transverse colon which measures up to 11 cm. Moderate stool burden in the colon. Small bowel and stomach decompressed. No visible obstructing colonic lesion. Vascular/Lymphatic: Scattered aortic and iliac calcifications. No aneurysm. No  adenopathy.  Assessment/Plan:  65 y.o. male with recurrent large bowel obstruction x4 days with no obvious masses or evidence of volvulus or ischemia on CT, though limited by lack of IV contrast. Non-visualized mass vs adhesions vs pseudo-obstruction vs undetected ischemia are all possible etiologies, complicated by multiple prior abdominal surgeries including colon resections for cancer and diverticulitis along with pertinent comorbidities including HTN, asthma, and immunosuppression ([place]) for rheumatoid arthritis.   - NPO, IVF resuscitation  - nasogastric and trans-anorectal decompression  - follow-up GI (already consulted) regarding likely colonoscopy  - will follow, serial abdominal exams   - DVT prophylaxis  All of the above findings and recommendations were discussed with the patient, and all of his questions were answered to his expressed satisfaction.  Thank you for the opportunity to participate in this patient's care.   -- Marilynne Drivers Rosana Hoes, MD, [place]: [facility] Office: [phone]

## 2015-12-15 NOTE — ED Notes (Signed)
Multiple attempts to insert NG tube without success. Dr. Wyvonnia Dusky notified.

## 2015-12-15 NOTE — ED Provider Notes (Signed)
[facility] DEPT Provider Note   CSN: [id number] Arrival date & time: 12/15/15  [id number]  First Provider Contact:  None       History   Chief Complaint Chief Complaint  Patient presents with  . Abdominal Pain    HPI Kevin Lopez is a 65 y.o. male.  Kevin Lopez is a 65 y.o. Male with history of HTN, diverticulosis, RA, and past abdominal surgeries presents to ED with complaint of nausea, vomiting, constipation. Patient states he has not had a bowel movement for 4 days. He is not passing flatus. He has associated fever of 101102 at home, diaphoresis, nausea, vomiting, abdominal distention, decreased appetite, difficulty urinating, shortness of breath secondary to abdominal pain.  Denies dysuria, hematuria, chest pain, rashes, dizziness, lightheadedness, headache, or sore throat. Patient has tried citrate and 2 enemas with no relief of symptoms. Patient endorses having abdominal surgery approximately 7 years ago related to diverticulosis.       Past Medical History:  Diagnosis Date  . Diverticulosis   . Hypertension   . RA (rheumatoid arthritis) [facility])     Patient Active Problem List   Diagnosis Date Noted  . Bowel obstruction ([place]) 12/15/2015  . Leukocytosis 12/15/2015  . Generalized abdominal pain   . Nausea with vomiting   . Intestinal obstruction ([place])   . Hypertension   . RA (rheumatoid arthritis) ([place])   . Diverticulosis     Past Surgical History:  Procedure Laterality Date  . APPENDECTOMY    . COLON SURGERY    . KNEE SURGERY         Home Medications    Prior to Admission medications   Medication Sig Start Date End Date Taking? Authorizing Provider  Abatacept (ORENCIA) 125 MG/ML SOSY Inject 125 mg into the skin once a week. On Thursday.   Yes Historical Provider, MD  adapalene (DIFFERIN) 0.1 % cream Apply 1 application topically daily as needed. 11/23/15  Yes Historical Provider, MD  albuterol (PROVENTIL HFA;VENTOLIN HFA) 108 (90 Base) MCG/ACT  inhaler Inhale 2 puffs into the lungs every 6 (six) hours as needed for wheezing or shortness of breath.   Yes Historical Provider, MD  amLODipine (NORVASC) 10 MG tablet Take 10 mg by mouth daily.   Yes Historical Provider, MD  clonazePAM (KLONOPIN) 0.5 MG tablet Take 0.5 mg by mouth 2 (two) times daily as needed for anxiety.  12/04/15  Yes Historical Provider, MD  ketoconazole (NIZORAL) 2 % cream Apply 1 application topically daily as needed for irritation.  11/22/15  Yes Historical Provider, MD  Multiple Vitamin (MULTIVITAMIN WITH MINERALS) TABS tablet Take 1 tablet by mouth daily.   Yes Historical Provider, MD  Naproxen Sod-Diphenhydramine (ALEVE PM) 220-25 MG TABS Take 1 tablet by mouth at bedtime as needed (pain/sleep).   Yes Historical Provider, MD  telmisartan (MICARDIS) 80 MG tablet Take 80 mg by mouth daily.   Yes Historical Provider, MD  zolpidem (AMBIEN CR) 12.5 MG CR tablet Take 12.5 mg by mouth at bedtime as needed for sleep.   Yes Historical Provider, MD    Family History Family History  Problem Relation Age of Onset  . Heart attack Father   . Cancer Mother   . Hypertension Brother   . Hypertension Sister     Social History Social History  Substance Use Topics  . Smoking status: Never Smoker  . Smokeless tobacco: Never Used  . Alcohol use Yes     Comment: occ.      Allergies   Latex  Review of Systems Review of Systems  Constitutional: Positive for diaphoresis and fever.  Respiratory: Positive for shortness of breath ( secondary to abdominal pain).   Gastrointestinal: Positive for abdominal distention, abdominal pain, constipation, nausea and vomiting.  Genitourinary: Positive for difficulty urinating.  All other systems reviewed and are negative.    Physical Exam Updated Vital Signs BP 141/81 (BP Location: Left Arm)   Pulse 77   Temp 98.1 F (36.7 C) (Oral)   Resp 17   Ht 5\' 9"  (1.753 m)   Wt 78.9 kg   SpO2 100%   BMI 25.70 kg/m   Physical Exam    Constitutional: He appears well-developed and well-nourished. He appears distressed.  HENT:  Head: Normocephalic and atraumatic.  Mouth/Throat: Oropharynx is clear and moist.  Eyes: Conjunctivae are normal. Right eye exhibits no discharge. Left eye exhibits no discharge.  Neck: Normal range of motion.  Cardiovascular: Normal rate, regular rhythm, normal heart sounds and intact distal pulses.   Pulmonary/Chest: Effort normal and breath sounds normal. He has no wheezes. He has no rales.  Abdominal: He exhibits distension. Bowel sounds are decreased. There is tenderness. There is rigidity, rebound and guarding.  Obvious abdominal distention noted. Bowel sounds heard, although decreased. Significant tenderness to palpation of abdomen. Even light placement of stethoscope on abdomen elicits pain. Guarding and rigidity noted. Peritoneal signs noted.  Neurological: He is alert.  Skin: Skin is warm and dry. He is not diaphoretic.  Psychiatric: He has a normal mood and affect. His behavior is normal.     ED Treatments / Results  Labs (all labs ordered are listed, but only abnormal results are displayed) Labs Reviewed  COMPREHENSIVE METABOLIC PANEL - Abnormal; Notable for the following:       Result Value   Glucose, Bld 142 (*)    Creatinine, Ser 1.26 (*)    GFR calc non Af Amer 59 (*)    All other components within normal limits  CBC - Abnormal; Notable for the following:    WBC 19.4 (*)    Hemoglobin 11.9 (*)    HCT 36.0 (*)    All other components within normal limits  LIPASE, BLOOD  LACTIC ACID, PLASMA  URINALYSIS, ROUTINE W REFLEX MICROSCOPIC (NOT AT [facility])  LACTIC ACID, PLASMA    EKG  EKG Interpretation None       Radiology Ct Abdomen Pelvis Wo Contrast  Result Date: 12/15/2015 CLINICAL DATA:  Generalized abdominal pain. EXAM: CT ABDOMEN AND PELVIS WITHOUT CONTRAST TECHNIQUE: Multidetector CT imaging of the abdomen and pelvis was performed following the standard protocol  without IV contrast. COMPARISON:  Plain films 12/15/2015.  CT 05/21/2010 FINDINGS: Lower chest: Dependent atelectasis at the right base. Elevation of the right hemidiaphragm. Left lung base is clear. Heart is borderline in size. No effusions. Small hiatal hernia. Hepatobiliary: No focal hepatic abnormality. Gallbladder unremarkable. Pancreas: No focal abnormality or ductal dilatation. Spleen: No focal abnormality.  Normal size. Adrenals/Urinary Tract: No adrenal abnormality. No focal renal abnormality. No stones or hydronephrosis. Urinary bladder is unremarkable. Stomach/Bowel: Rectal contrast was administered. Significant gaseous distention of the colon, most pronounced in the transverse colon which measures up to 11 cm. Moderate stool burden in the colon. Small bowel and stomach decompressed. No visible obstructing colonic lesion. Vascular/Lymphatic: Scattered aortic and iliac calcifications. No aneurysm. No adenopathy. Reproductive:  No visible focal abnormality. Other: Small amount of free fluid in the right side of the pelvis. No free air. Musculoskeletal: No acute bony abnormality or focal  bone lesion IMPRESSION: Marked gaseous distention of the colon with moderate stool burden. No visible obstructing process. Transverse colon measures up to 11 cm in diameter. Small hiatal hernia.  Right base atelectasis. Small amount of free fluid in the pelvis. Electronically Signed   By: Rolm Baptise M.D.   On: 12/15/2015 13:29  Dg Abd Acute W/chest  Addendum Date: 12/15/2015   ADDENDUM REPORT: 12/15/2015 11:09 ADDENDUM: The dilated bowel in the left upper abdomen potentially could represent a focus of volvulated sigmoid colon. Given this possibility, correlation with CT advised. These results were called by telephone at the time of interpretation on 12/15/2015 at 11:08 am to [facility], [facility] , who verbally acknowledged these results. Electronically Signed   By: Lowella Grip III M.D.   On: 12/15/2015 11:09  Result  Date: 12/15/2015 CLINICAL DATA:  Generalized abdominal pain and distention with vomiting for 4 days EXAM: DG ABDOMEN ACUTE W/ 1V CHEST COMPARISON:  Abdomen series May 24, 2010 FINDINGS: PA chest: No edema or consolidation. Heart size and pulmonary vascularity are normal. No adenopathy. Supine and upright abdomen: There are multiple loops of dilated colon with multiple air-fluid levels. No free air evident. There are small phleboliths in the pelvis. IMPRESSION: Loops of dilated colon with multiple air-fluid levels. This appearance is concerning for distal bowel obstruction. Ileus is a differential consideration. No free air evident. Lungs clear. Electronically Signed: By: Lowella Grip III M.D. On: 12/15/2015 11:02  Dg Abd Portable 1 View  Result Date: 12/15/2015 CLINICAL DATA:  NG tube placement. EXAM: PORTABLE ABDOMEN - 1 VIEW COMPARISON:  None. FINDINGS: NG tube tip is in the distal stomach with the side port in the stomach. Continued gaseous distention of colon, particularly the transverse colon. No free air organomegaly. IMPRESSION: NG tube tip in the distal stomach. Electronically Signed   By: Rolm Baptise M.D.   On: 12/15/2015 16:37   Procedures Procedures (including critical care time)  Medications Ordered in ED Medications  diatrizoate meglumine-sodium (GASTROGRAFIN) 66-10 % solution (not administered)  diatrizoate meglumine-sodium (GASTROGRAFIN) 66-10 % solution (not administered)  diatrizoate meglumine-sodium (GASTROGRAFIN) 66-10 % solution (not administered)  lidocaine (XYLOCAINE) 2 % jelly (not administered)  sodium chloride 0.9 % bolus 1,000 mL (not administered)  0.9 %  sodium chloride infusion (not administered)  morphine 2 MG/ML injection 2 mg (2 mg Intravenous Given 12/15/15 1029)  ondansetron (ZOFRAN) injection 4 mg (4 mg Intravenous Given 12/15/15 1029)  morphine 2 MG/ML injection 2 mg (2 mg Intravenous Given 12/15/15 1430)  sodium chloride 0.9 % bolus 1,000 mL (1,000 mLs  Intravenous New Bag/Given 12/15/15 1346)  HYDROmorphone (DILAUDID) injection 1 mg (1 mg Intravenous Given 12/15/15 1426)  ondansetron (ZOFRAN) injection 4 mg (4 mg Intravenous Given 12/15/15 1426)     Initial Impression / Assessment and Plan / ED Course  I have reviewed the triage vital signs and the nursing notes.  Pertinent labs & imaging results that were available during my care of the patient were reviewed by me and considered in my medical decision making (see chart for details).  Clinical Course  Comment By Time  Spoke with radiology, concern for possible sigmoid volvulus. Recommending CT abdomen and pelvis. CT abdomen and pelvis ordered. Roxanna Mew, [place] 07/28 1108  On re-evaluation patient continues to have uncontrolled pain. Will reorder pain meds. Roxanna Mew, [place] 07/28 1130    Patient is afebrile and ill-appearing. Vital signs are stable. Physical exam remarkable for abdominal distention and peritoneal signs. Concern for  obstruction or perforation. Will check CBC, CMP, lipase, UA, and acute abdomen x-ray. Discussed patient with Dr. Wyvonnia Dusky, agrees with plan. IV pain meds and Zofran initiated.  CBC remarkable for leukocytosis and mild anemia, although hemoglobin improved compared to previous values. CMP remarkable for mild AKI. Lipase unremarkable. UA pending. Spoke with radiology and concern for possible sigmoid volvulus on abdominal x-ray. Recommending CT abdomen pelvis CT abdomen and pelvis ordered. On reevaluation pain persists despite IV pain medication. Reorder pain meds. NG tube placement ordered.  CT abdomen and pelvis remarkable for 11 cm dilation of transverse colon. Back obstruction. GI consult by Dr. Wyvonnia Dusky, agrees the patient. NG tube placed. Surgery consult completed by Dr. Wyvonnia Dusky. Hospitalist consulted by Dr. Kingsley Callander for admission. Appreciate all consultant's input. Patient to be admitted for bowel obstruction.   Final Clinical Impressions(s) / ED  Diagnoses   Final diagnoses:  Intestinal obstruction, unspecified type [facility])    New Prescriptions New Prescriptions   No medications on file     Roxanna Mew, PA-C 12/15/15 [place], MD 12/15/15 1758

## 2015-12-15 NOTE — ED Triage Notes (Signed)
Pt comes in for generalized abdominal pain. Pt hasn't made a BM in 4 days. Pt has had n/v since then as well. Pt states the last BM he made was dark.

## 2015-12-15 NOTE — ED Notes (Signed)
Patient advised we needed urine specimen. 

## 2015-12-16 ENCOUNTER — Inpatient Hospital Stay (HOSPITAL_COMMUNITY): Payer: Medicare Other

## 2015-12-16 ENCOUNTER — Encounter (HOSPITAL_COMMUNITY): Payer: Self-pay | Admitting: Radiology

## 2015-12-16 DIAGNOSIS — K567 Ileus, unspecified: Secondary | ICD-10-CM

## 2015-12-16 DIAGNOSIS — C186 Malignant neoplasm of descending colon: Secondary | ICD-10-CM

## 2015-12-16 LAB — BASIC METABOLIC PANEL
ANION GAP: 9 (ref 5–15)
BUN: 12 mg/dL (ref 6–20)
CHLORIDE: 107 mmol/L (ref 101–111)
CO2: 21 mmol/L — AB (ref 22–32)
CREATININE: 1.36 mg/dL — AB (ref 0.61–1.24)
Calcium: 8.6 mg/dL — ABNORMAL LOW (ref 8.9–10.3)
GFR calc non Af Amer: 54 mL/min — ABNORMAL LOW (ref >60)
Glucose, Bld: 83 mg/dL (ref 65–99)
POTASSIUM: 3.9 mmol/L (ref 3.5–5.1)
SODIUM: 137 mmol/L (ref 135–145)

## 2015-12-16 LAB — CBC
HEMATOCRIT: 35.6 % — AB (ref 39.0–52.0)
HEMOGLOBIN: 11.3 g/dL — AB (ref 13.0–17.0)
MCH: 26.6 pg (ref 26.0–34.0)
MCHC: 31.7 g/dL (ref 30.0–36.0)
MCV: 83.8 fL (ref 78.0–100.0)
Platelets: 281 10*3/uL (ref 150–400)
RBC: 4.25 MIL/uL (ref 4.22–5.81)
RDW: 15.1 % (ref 11.5–15.5)
WBC: 21.4 10*3/uL — AB (ref 4.0–10.5)

## 2015-12-16 LAB — TSH: TSH: 1.094 u[IU]/mL (ref 0.350–4.500)

## 2015-12-16 MED ORDER — HYDRALAZINE HCL 20 MG/ML IJ SOLN: 5.0000 mg | INTRAMUSCULAR | Status: DC | PRN

## 2015-12-16 MED ORDER — LACTATED RINGERS IV BOLUS (SEPSIS): 1000.0000 mL | Freq: Three times a day (TID) | INTRAVENOUS | Status: AC | PRN

## 2015-12-16 MED ORDER — HYDROMORPHONE HCL 1 MG/ML IJ SOLN
0.5000 mg | INTRAMUSCULAR | Status: DC | PRN
  Administered 2015-12-16 – 2015-12-17 (×14): 2 mg via INTRAVENOUS
  Administered 2015-12-18 (×6): 1 mg via INTRAVENOUS
  Administered 2015-12-18 (×2): 2 mg via INTRAVENOUS
  Administered 2015-12-19: 1 mg via INTRAVENOUS
  Administered 2015-12-19: 2 mg via INTRAVENOUS
  Administered 2015-12-19: 1 mg via INTRAVENOUS
  Administered 2015-12-20 (×2): 2 mg via INTRAVENOUS
  Administered 2015-12-20: 1 mg via INTRAVENOUS
  Administered 2015-12-21: 2 mg via INTRAVENOUS
  Filled 2015-12-16: qty 1
  Filled 2015-12-16 (×3): qty 2
  Filled 2015-12-16 (×2): qty 1
  Filled 2015-12-16: qty 2
  Filled 2015-12-16: qty 1
  Filled 2015-12-16 (×3): qty 2
  Filled 2015-12-16: qty 1
  Filled 2015-12-16 (×2): qty 2
  Filled 2015-12-16 (×2): qty 1
  Filled 2015-12-16 (×9): qty 2
  Filled 2015-12-16 (×2): qty 1
  Filled 2015-12-16 (×2): qty 2

## 2015-12-16 MED ORDER — DEXTROSE 5 % IV SOLN: 1000.0000 mg | Freq: Four times a day (QID) | INTRAVENOUS | Status: DC | PRN

## 2015-12-16 MED ORDER — BISACODYL 10 MG RE SUPP
10.0000 mg | Freq: Every day | RECTAL | Status: DC
  Administered 2015-12-16 – 2015-12-18 (×2): 10 mg via RECTAL
  Filled 2015-12-16 (×3): qty 1

## 2015-12-16 MED ORDER — IOPAMIDOL (ISOVUE-370) INJECTION 76%
INTRAVENOUS | Status: AC
  Administered 2015-12-16: 04:00:00
  Filled 2015-12-16: qty 100

## 2015-12-16 MED ORDER — LACTATED RINGERS IV BOLUS (SEPSIS)
1000.0000 mL | Freq: Once | INTRAVENOUS | Status: AC
Start: 2015-12-16 — End: 2015-12-16
  Administered 2015-12-16: 1000 mL via INTRAVENOUS

## 2015-12-16 MED ORDER — LORAZEPAM 2 MG/ML IJ SOLN: 1.0000 mg | Freq: Once | INTRAMUSCULAR | Status: DC

## 2015-12-16 NOTE — Progress Notes (Addendum)
PROGRESS NOTE  Kevin Lopez  [id number] DOB: 1951/02/05  DOA: 12/15/2015 PCP: [name], MD   Brief Narrative:  65 year old male with PMH of HTN, rheumatoid arthritis (follows with rheumatology), multiple abdominal surgeries, prior episodes of SBO, initially presented to [facility] with an episode of colonic ileus. He was evaluated by GI (Dr. [name]) and general surgery who advised transfer to [facility] for further evaluation and management of ischemic colitis associated with colonic ileus. General surgery consulting. Admitted to stepdown.   Assessment & Plan:   Principal Problem:   Ischemic bowel disease ([place]) Active Problems:   Hypertension   SBO (small bowel obstruction) s/p ex lap in past   Leukocytosis   Cancer of left colon s/p colectomy/ostomy, and ostomy takedown   Ischemic colitis with colonic ileus - Initially presented to the [facility] and evaluated by GI and general surgery - Unsuccessful flexible sigmoidoscopy at [facility]. - Transferred to [facility] and admitted to stepdown unit. General surgery consultation appreciated. Discussed with Dr. Johney Maine on 7/29 >not convinced about pneumatosis seen on CT. - Treating conservatively with bowel rest, NG tube, IV fluids, IV Zosyn and pain management. Mobilize as tolerated. Hopefully can resolve with conservative management otherwise will need surgery. - No mesenteric artery occlusion on CT abdomen. Lactate normal.  Essential hypertension - Reasonably controlled. Continue IV metoprolol scheduled and when necessary IV hydralazine. Home amlodipine and ARB held secondary to nothing by mouth  Rheumatoid arthritis - Sees outpatient rheumatology. Hold Abatacept due to ischemic colitis.  Anemia - Follow CBCs.  Acute kidney injury versus stage II chronic kidney disease - Recent baseline creatinine not known. Presented with creatinine of 1.26 which is increased to 1.36. IV fluids and follow  BMP.   DVT prophylaxis: SCDs. Code Status: Full Family Communication: Discussed with patient. No family at bedside. Disposition Plan: Remains in stepdown unit.   Consultants:   General surgery  Procedures:   NG tube  Antimicrobials:   IV Zosyn 7/28 >    Subjective: Complaints of unchanged abdominal distention, severe abdominal pain this morning. States that he may have passed small flatus this morning but no BM. No dyspnea or chest pain reported.  Objective:  Vitals:   12/15/15 2255 12/16/15 0315 12/16/15 0837 12/16/15 1100  BP: (!) 158/89 130/79 128/75 137/69  Pulse: 73 65 66 63  Resp:  12 (!) 9 10  Temp: 99.1 F (37.3 C) 98.3 F (36.8 C) 98 F (36.7 C) 98 F (36.7 C)  TempSrc: Axillary Oral Oral Oral  SpO2: 100% 98% 92% 96%  Weight: 76.9 kg (169 lb 8.5 oz)     Height: 5\' 9"  (1.753 m)       Intake/Output Summary (Last 24 hours) at 12/16/15 1304 Last data filed at 12/16/15 0900  Gross per 24 hour  Intake             1375 ml  Output              200 ml  Net             1175 ml   Filed Weights   12/15/15 0943 12/15/15 1821 12/15/15 2255  Weight: 78.9 kg (174 lb) 75.8 kg (167 lb 1.7 oz) 76.9 kg (169 lb 8.5 oz)    Examination:  General exam: Pleasant middle-aged male lying on comfortably propped up in bed. Respiratory system: Clear to auscultation. Respiratory effort normal. Cardiovascular system: S1 & S2 heard, RRR. No JVD, murmurs, rubs, gallops or clicks. No  pedal edema. Telemetry: Sinus rhythm. Gastrointestinal system: Abdomen is distended, almost tense, diffuse tenderness with guarding but no rigidity or rebound. No organomegaly or masses felt. Diminished bowel sounds heard. Extensive laparotomy scar. Central nervous system: Alert and oriented. No focal neurological deficits. Extremities: Symmetric 5 x 5 power. Skin: No rashes, lesions or ulcers Psychiatry: Judgement and insight appear normal. Mood & affect appropriate.     Data Reviewed: I have  personally reviewed following labs and imaging studies  CBC:  Recent Labs Lab 12/15/15 0947 12/16/15 0947  WBC 19.4* 21.4*  HGB 11.9* 11.3*  HCT 36.0* 35.6*  MCV 81.3 83.8  PLT 311 [id number]   Basic Metabolic Panel:  Recent Labs Lab 12/15/15 0947 12/16/15 0947  NA 139 137  K 3.5 3.9  CL 103 107  CO2 27 21*  GLUCOSE 142* 83  BUN 16 12  CREATININE 1.26* 1.36*  CALCIUM 9.4 8.6*   GFR: Estimated Creatinine Clearance: 55.6 mL/min (by C-G formula based on SCr of 1.36 mg/dL). Liver Function Tests:  Recent Labs Lab 12/15/15 0947  AST 19  ALT 19  ALKPHOS 43  BILITOT 1.1  PROT 7.9  ALBUMIN 4.5    Recent Labs Lab 12/15/15 0947  LIPASE 32   No results for input(s): AMMONIA in the last 168 hours. Coagulation Profile: No results for input(s): INR, PROTIME in the last 168 hours. Cardiac Enzymes: No results for input(s): CKTOTAL, CKMB, CKMBINDEX, TROPONINI in the last 168 hours. BNP (last 3 results) No results for input(s): PROBNP in the last 8760 hours. HbA1C: No results for input(s): HGBA1C in the last 72 hours. CBG: No results for input(s): GLUCAP in the last 168 hours. Lipid Profile: No results for input(s): CHOL, HDL, LDLCALC, TRIG, CHOLHDL, LDLDIRECT in the last 72 hours. Thyroid Function Tests:  Recent Labs  12/15/15 0947  TSH 1.094   Anemia Panel: No results for input(s): VITAMINB12, FOLATE, FERRITIN, TIBC, IRON, RETICCTPCT in the last 72 hours.  Sepsis Labs:  Recent Labs Lab 12/15/15 1414 12/15/15 1644  LATICACIDVEN 1.3 1.2    No results found for this or any previous visit (from the past 240 hour(s)).       Radiology Studies: Ct Abdomen Pelvis Wo Contrast  Result Date: 12/15/2015 CLINICAL DATA:  Generalized abdominal pain. EXAM: CT ABDOMEN AND PELVIS WITHOUT CONTRAST TECHNIQUE: Multidetector CT imaging of the abdomen and pelvis was performed following the standard protocol without IV contrast. COMPARISON:  Plain films 12/15/2015.  CT  05/21/2010 FINDINGS: Lower chest: Dependent atelectasis at the right base. Elevation of the right hemidiaphragm. Left lung base is clear. Heart is borderline in size. No effusions. Small hiatal hernia. Hepatobiliary: No focal hepatic abnormality. Gallbladder unremarkable. Pancreas: No focal abnormality or ductal dilatation. Spleen: No focal abnormality.  Normal size. Adrenals/Urinary Tract: No adrenal abnormality. No focal renal abnormality. No stones or hydronephrosis. Urinary bladder is unremarkable. Stomach/Bowel: Rectal contrast was administered. Significant gaseous distention of the colon, most pronounced in the transverse colon which measures up to 11 cm. Moderate stool burden in the colon. Small bowel and stomach decompressed. No visible obstructing colonic lesion. Vascular/Lymphatic: Scattered aortic and iliac calcifications. No aneurysm. No adenopathy. Reproductive:  No visible focal abnormality. Other: Small amount of free fluid in the right side of the pelvis. No free air. Musculoskeletal: No acute bony abnormality or focal bone lesion IMPRESSION: Marked gaseous distention of the colon with moderate stool burden. No visible obstructing process. Transverse colon measures up to 11 cm in diameter. Small hiatal hernia.  Right  base atelectasis. Small amount of free fluid in the pelvis. Electronically Signed   By: Rolm Baptise M.D.   On: 12/15/2015 13:29  Dg Abd Acute W/chest  Addendum Date: 12/15/2015   ADDENDUM REPORT: 12/15/2015 11:09 ADDENDUM: The dilated bowel in the left upper abdomen potentially could represent a focus of volvulated sigmoid colon. Given this possibility, correlation with CT advised. These results were called by telephone at the time of interpretation on 12/15/2015 at 11:08 am to [facility] , who verbally acknowledged these results. Electronically Signed   By: Lowella Grip III M.D.   On: 12/15/2015 11:09  Result Date: 12/15/2015 CLINICAL DATA:  Generalized abdominal pain  and distention with vomiting for 4 days EXAM: DG ABDOMEN ACUTE W/ 1V CHEST COMPARISON:  Abdomen series May 24, 2010 FINDINGS: PA chest: No edema or consolidation. Heart size and pulmonary vascularity are normal. No adenopathy. Supine and upright abdomen: There are multiple loops of dilated colon with multiple air-fluid levels. No free air evident. There are small phleboliths in the pelvis. IMPRESSION: Loops of dilated colon with multiple air-fluid levels. This appearance is concerning for distal bowel obstruction. Ileus is a differential consideration. No free air evident. Lungs clear. Electronically Signed: By: Lowella Grip III M.D. On: 12/15/2015 11:02  Dg Abd Portable 1 View  Result Date: 12/15/2015 CLINICAL DATA:  NG tube placement. EXAM: PORTABLE ABDOMEN - 1 VIEW COMPARISON:  None. FINDINGS: NG tube tip is in the distal stomach with the side port in the stomach. Continued gaseous distention of colon, particularly the transverse colon. No free air organomegaly. IMPRESSION: NG tube tip in the distal stomach. Electronically Signed   By: Rolm Baptise M.D.   On: 12/15/2015 16:37  Ct Angio Abd/pel W/ And/or W/o  Result Date: 12/16/2015 CLINICAL DATA:  Mid to lower abdominal pain with bloating. Unsuccessful colonoscopy due to ischemic bowel. EXAM: CTA ABDOMEN AND PELVIS wITHOUT AND WITH CONTRAST TECHNIQUE: Multidetector CT imaging of the abdomen and pelvis was performed using the standard protocol during bolus administration of intravenous contrast. Multiplanar reconstructed images and MIPs were obtained and reviewed to evaluate the vascular anatomy. CONTRAST:  100 cc Isovue 370 IV COMPARISON:  12/15/2015 FINDINGS: Lower chest:  Cardiomegaly.  Bibasilar atelectasis.  No effusions. Hepatobiliary: No focal hepatic abnormality. Gallbladder unremarkable. Pancreas: No focal abnormality or ductal dilatation. Spleen: No focal abnormality.  Normal size. Adrenals/Urinary Tract: No adrenal abnormality. No focal  renal abnormality. No stones or hydronephrosis. Urinary bladder is unremarkable. Stomach/Bowel: The NG tube is present with the tip in the proximal duodenum. There is mild diffuse wall thickening in the visualized distal esophagus which may be related to esophagitis. Continued dilatation of the colon, slightly decreased since prior study. The transverse colon diameter measures 9.9 cm today compared with 11 cm yesterday. Questionable pneumatosis noted within the cecal wall and descending colon. Vascular/Lymphatic: Aorta is normal caliber. The celiac artery, superior mesenteric artery, inferior mesenteric artery are widely patent. Single renal arteries bilaterally which are widely patent. Iliofemoral vessels widely patent. Reproductive: No visible focal abnormality. Other: Small amount of free fluid noted within the pelvis, similar to prior study. Musculoskeletal: No acute bony abnormality or focal bone lesion. Review of the MIP images confirms the above findings. IMPRESSION: Questionable pneumatosis within the colonic wall, best seen in the cecum and descending colon. Continued dilatation of the colon, slightly decreased since prior study. NG tube tip is in the proximal duodenum. No evidence of mesenteric arterial stenosis/occlusion. Electronically Signed   By: Lennette Bihari  [place] M.D.   On: 12/16/2015 07:33       Scheduled Meds: . sodium chloride   Intravenous STAT  . bisacodyl  10 mg Rectal Daily  . metoprolol  5 mg Intravenous Q6H  . piperacillin-tazobactam  3.375 g Intravenous Q8H  . sodium chloride  1,000 mL Intravenous Once   Continuous Infusions:     LOS: 1 day    Time spent: 45 minutes.    [facility], MD [facility] Pager [phone]  If 7PM-7AM, please contact night-coverage [email or web address] Password [email or web address] 12/16/2015, 1:04 PM

## 2015-12-16 NOTE — Progress Notes (Signed)
[facility]  [address] [place], [place] [address] Phone: [phone] FAX: [phone]   Kevin Lopez [id number] Sep 30, 1950  CARE TEAM:  PCP: [facility]  Outpatient Care Team: Patient Care Team: [facility] as PCP - General (Rheumatology)  Inpatient Treatment Team: Treatment Team: Attending Provider: Modena Jansky, MD; Consulting Physician: Danie Binder, MD; Rounding Team: Md Edison Pace, MD; Technician: Meredith Staggers, NT; Rounding Team: Minerva Ends, MD  Problem List:   Principal Problem:   Ischemic bowel disease [facility]) Active Problems:   Hypertension   SBO (small bowel obstruction) s/p ex lap in past   Leukocytosis   Cancer of left colon s/p colectomy/ostomy, and ostomy takedown   1 Day Post-Op  12/15/2015  Procedure(s): FLEXIBLE SIGMOIDOSCOPY   Assessment  Ischemic colitis with colonic ileus  Plan:  -NGT -IVF -IV ABx - Zosyn #2/... -check labs -control pain - serial exams -VTE prophylaxis- SCDs, etc -mobilize as tolerated to help recovery  -if worsens, ex lap with colectomy/ostomy.  Try hold off & see if Ab x can correct, especially in a hostile abdomen.  Pt wishes to avoid another operation if possible.  I updated the patient's status to the patient, ICU RN, Dr Algis Liming [facility] .  Recommendations were made.  Questions were answered.  They expressed understanding & appreciation.   [name], M.D., F.A.C.S. Gastrointestinal and Minimally Invasive Surgery [facility] [address], [place], [place] [address] [phone] Main / Paging   12/16/2015  Subjective:  Sore Distended  Objective:  Vital signs:  Vitals:   12/15/15 1940 12/15/15 2025 12/15/15 2255 12/16/15 0315  BP: (!) 141/80 (!) 163/87 (!) 158/89 130/79  Pulse: 73 82 73 65  Resp: _0 Temp:  98.9 F (37.2 C) 99.1 F (37.3 C) 98.3 F (36.8 C)   TempSrc:  Axillary Axillary Oral  SpO2: 100% 98% 100% 98%  Weight:   76.9 kg (169 lb 8.5 oz)   Height:   _1  (1.753 m)     Last BM Date: 12/15/15  Intake/Output   Yesterday:  07/28 0701 - 07/29 0700 In: 1000 [I.V.:1000] Out: 200 [Urine:200] This shift:  No intake/output data recorded.  Bowel function:  Flatus: No  BM:  No  Drain: (No drain)   Physical Exam:  General: Pt awake/alert/oriented x4 in mild acute distress Eyes: PERRL, normal EOM.  Sclera clear.  No icterus Neuro: CN II-XII intact w/o focal sensory/motor deficits. Lymph: No head/neck/groin lymphadenopathy Psych:  No delerium/psychosis/paranoia HENT: Normocephalic, Mucus membranes moist.  No thrush Neck: Supple, No tracheal deviation Chest: No chest wall pain w good excursion CV:  Pulses intact.  Regular rhythm MS: Normal AROM mjr joints.  No obvious deformity Abdomen: Rigid.  Very distended.  Tenderness at diffuse but not rebound.  No evidence of peritonitis.  No incarcerated hernias. Ext:  SCDs BLE.  No mjr edema.  No cyanosis Skin: No petechiae / purpura  Results:   Labs: Results for orders placed or performed during the hospital encounter of 12/15/15 (from the past 48 hour(s))  Lipase, blood     Status: None   Collection Time: 12/15/15  9:47 AM  Result Value Ref Range   Lipase 32 11 - 51 U/L  Comprehensive metabolic panel     Status: Abnormal   Collection Time: 12/15/15  9:47 AM  Result Value Ref Range   Sodium 139 135 - 145 mmol/L   Potassium  3.5 3.5 - 5.1 mmol/L   Chloride 103 101 - 111 mmol/L   CO2 27 22 - 32 mmol/L   Glucose, Bld 142 (H) 65 - 99 mg/dL   BUN 16 6 - 20 mg/dL   Creatinine, Ser 1.26 (H) 0.61 - 1.24 mg/dL   Calcium 9.4 8.9 - 10.3 mg/dL   Total Protein 7.9 6.5 - 8.1 g/dL   Albumin 4.5 3.5 - 5.0 g/dL   AST 19 15 - 41 U/L   ALT 19 17 - 63 U/L   Alkaline Phosphatase 43 38 - 126 U/L   Total Bilirubin 1.1 0.3 - 1.2 mg/dL   GFR calc non Af Amer 59 (L) >60 mL/min   GFR calc Af  Amer >60 >60 mL/min    Comment: (NOTE) The eGFR has been calculated using the CKD EPI equation. This calculation has not been validated in all clinical situations. eGFR's persistently <60 mL/min signify possible Chronic Kidney Disease.    Anion gap 9 5 - 15  CBC     Status: Abnormal   Collection Time: 12/15/15  9:47 AM  Result Value Ref Range   WBC 19.4 (H) 4.0 - 10.5 K/uL   RBC 4.43 4.22 - 5.81 MIL/uL   Hemoglobin 11.9 (L) 13.0 - 17.0 g/dL   HCT 36.0 (L) 39.0 - 52.0 %   MCV 81.3 78.0 - 100.0 fL   MCH 26.9 26.0 - 34.0 pg   MCHC 33.1 30.0 - 36.0 g/dL   RDW 14.9 11.5 - 15.5 %   Platelets 311 150 - 400 K/uL  TSH     Status: None   Collection Time: 12/15/15  9:47 AM  Result Value Ref Range   TSH 1.094 0.350 - 4.500 uIU/mL  Lactic acid, plasma     Status: None   Collection Time: 12/15/15  2:14 PM  Result Value Ref Range   Lactic Acid, Venous 1.3 0.5 - 1.9 mmol/L  Lactic acid, plasma     Status: None   Collection Time: 12/15/15  4:44 PM  Result Value Ref Range   Lactic Acid, Venous 1.2 0.5 - 1.9 mmol/L    Imaging / Studies: Ct Abdomen Pelvis Wo Contrast  Result Date: 12/15/2015 CLINICAL DATA:  Generalized abdominal pain. EXAM: CT ABDOMEN AND PELVIS WITHOUT CONTRAST TECHNIQUE: Multidetector CT imaging of the abdomen and pelvis was performed following the standard protocol without IV contrast. COMPARISON:  Plain films 12/15/2015.  CT 05/21/2010 FINDINGS: Lower chest: Dependent atelectasis at the right base. Elevation of the right hemidiaphragm. Left lung base is clear. Heart is borderline in size. No effusions. Small hiatal hernia. Hepatobiliary: No focal hepatic abnormality. Gallbladder unremarkable. Pancreas: No focal abnormality or ductal dilatation. Spleen: No focal abnormality.  Normal size. Adrenals/Urinary Tract: No adrenal abnormality. No focal renal abnormality. No stones or hydronephrosis. Urinary bladder is unremarkable. Stomach/Bowel: Rectal contrast was administered.  Significant gaseous distention of the colon, most pronounced in the transverse colon which measures up to 11 cm. Moderate stool burden in the colon. Small bowel and stomach decompressed. No visible obstructing colonic lesion. Vascular/Lymphatic: Scattered aortic and iliac calcifications. No aneurysm. No adenopathy. Reproductive:  No visible focal abnormality. Other: Small amount of free fluid in the right side of the pelvis. No free air. Musculoskeletal: No acute bony abnormality or focal bone lesion IMPRESSION: Marked gaseous distention of the colon with moderate stool burden. No visible obstructing process. Transverse colon measures up to 11 cm in diameter. Small hiatal hernia.  Right base atelectasis. Small amount of free  fluid in the pelvis. Electronically Signed   By: Rolm Baptise M.D.   On: 12/15/2015 13:29  Dg Abd Acute W/chest  Addendum Date: 12/15/2015   ADDENDUM REPORT: 12/15/2015 11:09 ADDENDUM: The dilated bowel in the left upper abdomen potentially could represent a focus of volvulated sigmoid colon. Given this possibility, correlation with CT advised. These results were called by telephone at the time of interpretation on 12/15/2015 at 11:08 am to [facility] [place] , who verbally acknowledged these results. Electronically Signed   By: Lowella Grip III M.D.   On: 12/15/2015 11:09  Result Date: 12/15/2015 CLINICAL DATA:  Generalized abdominal pain and distention with vomiting for 4 days EXAM: DG ABDOMEN ACUTE W/ 1V CHEST COMPARISON:  Abdomen series May 24, 2010 FINDINGS: PA chest: No edema or consolidation. Heart size and pulmonary vascularity are normal. No adenopathy. Supine and upright abdomen: There are multiple loops of dilated colon with multiple air-fluid levels. No free air evident. There are small phleboliths in the pelvis. IMPRESSION: Loops of dilated colon with multiple air-fluid levels. This appearance is concerning for distal bowel obstruction. Ileus is a differential  consideration. No free air evident. Lungs clear. Electronically Signed: By: Lowella Grip III M.D. On: 12/15/2015 11:02  Dg Abd Portable 1 View  Result Date: 12/15/2015 CLINICAL DATA:  NG tube placement. EXAM: PORTABLE ABDOMEN - 1 VIEW COMPARISON:  None. FINDINGS: NG tube tip is in the distal stomach with the side port in the stomach. Continued gaseous distention of colon, particularly the transverse colon. No free air organomegaly. IMPRESSION: NG tube tip in the distal stomach. Electronically Signed   By: Rolm Baptise M.D.   On: 12/15/2015 16:37  Ct Angio Abd/pel W/ And/or W/o  Result Date: 12/16/2015 CLINICAL DATA:  Mid to lower abdominal pain with bloating. Unsuccessful colonoscopy due to ischemic bowel. EXAM: CTA ABDOMEN AND PELVIS wITHOUT AND WITH CONTRAST TECHNIQUE: Multidetector CT imaging of the abdomen and pelvis was performed using the standard protocol during bolus administration of intravenous contrast. Multiplanar reconstructed images and MIPs were obtained and reviewed to evaluate the vascular anatomy. CONTRAST:  100 cc Isovue 370 IV COMPARISON:  12/15/2015 FINDINGS: Lower chest:  Cardiomegaly.  Bibasilar atelectasis.  No effusions. Hepatobiliary: No focal hepatic abnormality. Gallbladder unremarkable. Pancreas: No focal abnormality or ductal dilatation. Spleen: No focal abnormality.  Normal size. Adrenals/Urinary Tract: No adrenal abnormality. No focal renal abnormality. No stones or hydronephrosis. Urinary bladder is unremarkable. Stomach/Bowel: The NG tube is present with the tip in the proximal duodenum. There is mild diffuse wall thickening in the visualized distal esophagus which may be related to esophagitis. Continued dilatation of the colon, slightly decreased since prior study. The transverse colon diameter measures 9.9 cm today compared with 11 cm yesterday. Questionable pneumatosis noted within the cecal wall and descending colon. Vascular/Lymphatic: Aorta is normal caliber. The  celiac artery, superior mesenteric artery, inferior mesenteric artery are widely patent. Single renal arteries bilaterally which are widely patent. Iliofemoral vessels widely patent. Reproductive: No visible focal abnormality. Other: Small amount of free fluid noted within the pelvis, similar to prior study. Musculoskeletal: No acute bony abnormality or focal bone lesion. Review of the MIP images confirms the above findings. IMPRESSION: Questionable pneumatosis within the colonic wall, best seen in the cecum and descending colon. Continued dilatation of the colon, slightly decreased since prior study. NG tube tip is in the proximal duodenum. No evidence of mesenteric arterial stenosis/occlusion. Electronically Signed   By: Rolm Baptise M.D.   On: 12/16/2015  07:33   Medications / Allergies: per chart  Antibiotics: Anti-infectives    Start     Dose/Rate Route Frequency Ordered Stop   12/15/15 2100  piperacillin-tazobactam (ZOSYN) IVPB 3.375 g     3.375 g 12.5 mL/hr over 240 Minutes Intravenous Every 8 hours 12/15/15 1958          Note: Portions of this report may have been transcribed using voice recognition software. Every effort was made to ensure accuracy; however, inadvertent computerized transcription errors may be present.   Any transcriptional errors that result from this process are unintentional.     [name], M.D., F.A.C.S. Gastrointestinal and Minimally Invasive Surgery [facility] [address] [place], [place] [address] [phone] Main / Paging   12/16/2015

## 2015-12-16 NOTE — Consult Note (Signed)
Reason for Consult:Colonic ischemia Referring Physician: Deejay Koppelman is an 65 y.o. male.  HPI:  Pt is a 65 yo M with 4 days of abdominal pain, bloating, and no BM presented to [facility] yesterday.  He has a history of diverticulitis requiring two ex laps with partial colon removal and ostomies (per the patient) followed by ostomy takedowns (he tells me 4 surgeries).  He has also had small bowel obstructions.  He denies significant nausea, but has had no appetite. He has had increasing difficulty with BMs before 4 days ago.  He has now had some gas following the flex sig.  He denies fever/chills.    Past Medical History:  Diagnosis Date  . Diverticulosis   . Hypertension   . RA (rheumatoid arthritis) ([place])     Past Surgical History:  Procedure Laterality Date  . APPENDECTOMY    . COLON SURGERY    . KNEE SURGERY      Family History  Problem Relation Age of Onset  . Heart attack Father   . Cancer Mother   . Hypertension Brother   . Hypertension Sister     Social History:  reports that he has never smoked. He has never used smokeless tobacco. He reports that he drinks alcohol. He reports that he does not use drugs.  Allergies:  Allergies  Allergen Reactions  . Latex Shortness Of Breath    Medications:  Prior to Admission:  Prescriptions Prior to Admission  Medication Sig Dispense Refill Last Dose  . Abatacept (ORENCIA) 125 MG/ML SOSY Inject 125 mg into the skin once a week. On Thursday.   12/07/2015 at Unknown time  . adapalene (DIFFERIN) 0.1 % cream Apply 1 application topically daily as needed.  6 unknown  . albuterol (PROVENTIL HFA;VENTOLIN HFA) 108 (90 Base) MCG/ACT inhaler Inhale 2 puffs into the lungs every 6 (six) hours as needed for wheezing or shortness of breath.   unknown  . amLODipine (NORVASC) 10 MG tablet Take 10 mg by mouth daily.   12/14/2015 at Unknown time  . clonazePAM (KLONOPIN) 0.5 MG tablet Take 0.5 mg by mouth 2 (two) times daily  as needed for anxiety.   0 unknown  . ketoconazole (NIZORAL) 2 % cream Apply 1 application topically daily as needed for irritation.   2 unknown  . Multiple Vitamin (MULTIVITAMIN WITH MINERALS) TABS tablet Take 1 tablet by mouth daily.   Past Week at Unknown time  . Naproxen Sod-Diphenhydramine (ALEVE PM) 220-25 MG TABS Take 1 tablet by mouth at bedtime as needed (pain/sleep).   Past Week at Unknown time  . telmisartan (MICARDIS) 80 MG tablet Take 80 mg by mouth daily.   12/14/2015 at Unknown time  . zolpidem (AMBIEN CR) 12.5 MG CR tablet Take 12.5 mg by mouth at bedtime as needed for sleep.   unknown at Unknown time    Results for orders placed or performed during the hospital encounter of 12/15/15 (from the past 48 hour(s))  Lipase, blood     Status: None   Collection Time: 12/15/15  9:47 AM  Result Value Ref Range   Lipase 32 11 - 51 U/L  Comprehensive metabolic panel     Status: Abnormal   Collection Time: 12/15/15  9:47 AM  Result Value Ref Range   Sodium 139 135 - 145 mmol/L   Potassium 3.5 3.5 - 5.1 mmol/L   Chloride 103 101 - 111 mmol/L   CO2 27 22 - 32 mmol/L   Glucose, Bld  142 (H) 65 - 99 mg/dL   BUN 16 6 - 20 mg/dL   Creatinine, Ser 1.26 (H) 0.61 - 1.24 mg/dL   Calcium 9.4 8.9 - 10.3 mg/dL   Total Protein 7.9 6.5 - 8.1 g/dL   Albumin 4.5 3.5 - 5.0 g/dL   AST 19 15 - 41 U/L   ALT 19 17 - 63 U/L   Alkaline Phosphatase 43 38 - 126 U/L   Total Bilirubin 1.1 0.3 - 1.2 mg/dL   GFR calc non Af Amer 59 (L) >60 mL/min   GFR calc Af Amer >60 >60 mL/min    Comment: (NOTE) The eGFR has been calculated using the CKD EPI equation. This calculation has not been validated in all clinical situations. eGFR's persistently <60 mL/min signify possible Chronic Kidney Disease.    Anion gap 9 5 - 15  CBC     Status: Abnormal   Collection Time: 12/15/15  9:47 AM  Result Value Ref Range   WBC 19.4 (H) 4.0 - 10.5 K/uL   RBC 4.43 4.22 - 5.81 MIL/uL   Hemoglobin 11.9 (L) 13.0 - 17.0 g/dL    HCT 36.0 (L) 39.0 - 52.0 %   MCV 81.3 78.0 - 100.0 fL   MCH 26.9 26.0 - 34.0 pg   MCHC 33.1 30.0 - 36.0 g/dL   RDW 14.9 11.5 - 15.5 %   Platelets 311 150 - 400 K/uL  TSH     Status: None   Collection Time: 12/15/15  9:47 AM  Result Value Ref Range   TSH 1.094 0.350 - 4.500 uIU/mL  Lactic acid, plasma     Status: None   Collection Time: 12/15/15  2:14 PM  Result Value Ref Range   Lactic Acid, Venous 1.3 0.5 - 1.9 mmol/L  Lactic acid, plasma     Status: None   Collection Time: 12/15/15  4:44 PM  Result Value Ref Range   Lactic Acid, Venous 1.2 0.5 - 1.9 mmol/L    Ct Abdomen Pelvis Wo Contrast  Result Date: 12/15/2015 CLINICAL DATA:  Generalized abdominal pain. EXAM: CT ABDOMEN AND PELVIS WITHOUT CONTRAST TECHNIQUE: Multidetector CT imaging of the abdomen and pelvis was performed following the standard protocol without IV contrast. COMPARISON:  Plain films 12/15/2015.  CT 05/21/2010 FINDINGS: Lower chest: Dependent atelectasis at the right base. Elevation of the right hemidiaphragm. Left lung base is clear. Heart is borderline in size. No effusions. Small hiatal hernia. Hepatobiliary: No focal hepatic abnormality. Gallbladder unremarkable. Pancreas: No focal abnormality or ductal dilatation. Spleen: No focal abnormality.  Normal size. Adrenals/Urinary Tract: No adrenal abnormality. No focal renal abnormality. No stones or hydronephrosis. Urinary bladder is unremarkable. Stomach/Bowel: Rectal contrast was administered. Significant gaseous distention of the colon, most pronounced in the transverse colon which measures up to 11 cm. Moderate stool burden in the colon. Small bowel and stomach decompressed. No visible obstructing colonic lesion. Vascular/Lymphatic: Scattered aortic and iliac calcifications. No aneurysm. No adenopathy. Reproductive:  No visible focal abnormality. Other: Small amount of free fluid in the right side of the pelvis. No free air. Musculoskeletal: No acute bony abnormality or  focal bone lesion IMPRESSION: Marked gaseous distention of the colon with moderate stool burden. No visible obstructing process. Transverse colon measures up to 11 cm in diameter. Small hiatal hernia.  Right base atelectasis. Small amount of free fluid in the pelvis. Electronically Signed   By: Rolm Baptise M.D.   On: 12/15/2015 13:29  Dg Abd Acute W/chest  Addendum Date: 12/15/2015  ADDENDUM REPORT: 12/15/2015 11:09 ADDENDUM: The dilated bowel in the left upper abdomen potentially could represent a focus of volvulated sigmoid colon. Given this possibility, correlation with CT advised. These results were called by telephone at the time of interpretation on 12/15/2015 at 11:08 am to [facility] , who verbally acknowledged these results. Electronically Signed   By: Lowella Grip III M.D.   On: 12/15/2015 11:09  Result Date: 12/15/2015 CLINICAL DATA:  Generalized abdominal pain and distention with vomiting for 4 days EXAM: DG ABDOMEN ACUTE W/ 1V CHEST COMPARISON:  Abdomen series May 24, 2010 FINDINGS: PA chest: No edema or consolidation. Heart size and pulmonary vascularity are normal. No adenopathy. Supine and upright abdomen: There are multiple loops of dilated colon with multiple air-fluid levels. No free air evident. There are small phleboliths in the pelvis. IMPRESSION: Loops of dilated colon with multiple air-fluid levels. This appearance is concerning for distal bowel obstruction. Ileus is a differential consideration. No free air evident. Lungs clear. Electronically Signed: By: Lowella Grip III M.D. On: 12/15/2015 11:02  Dg Abd Portable 1 View  Result Date: 12/15/2015 CLINICAL DATA:  NG tube placement. EXAM: PORTABLE ABDOMEN - 1 VIEW COMPARISON:  None. FINDINGS: NG tube tip is in the distal stomach with the side port in the stomach. Continued gaseous distention of colon, particularly the transverse colon. No free air organomegaly. IMPRESSION: NG tube tip in the distal stomach.  Electronically Signed   By: Rolm Baptise M.D.   On: 12/15/2015 16:37   Review of Systems  Constitutional: Negative.   HENT: Negative.   Eyes: Negative.   Respiratory: Negative.   Cardiovascular: Negative.   Gastrointestinal: Positive for abdominal pain and constipation.       Bloating  Genitourinary: Negative.   Musculoskeletal: Negative.   Skin: Negative.   Neurological: Negative.   Endo/Heme/Allergies: Negative.   Psychiatric/Behavioral: Negative.    Blood pressure (!) 158/89, pulse 73, temperature 99.1 F (37.3 C), temperature source Axillary, resp. rate 16, height _0  (1.753 m), weight 76.9 kg (169 lb 8.5 oz), SpO2 100 %. Physical Exam  Constitutional: He appears well-developed and well-nourished. No distress (was sleeping upon my arrival).  HENT:  Head: Normocephalic and atraumatic.  Nose: Nose normal.  Eyes: Conjunctivae are normal. Pupils are equal, round, and reactive to light. No scleral icterus.  Neck: Neck supple. No tracheal deviation present. No thyromegaly present.  Cardiovascular: Normal rate, regular rhythm, normal heart sounds and intact distal pulses.   Respiratory: Effort normal and breath sounds normal. No respiratory distress. He has no wheezes. He has no rales. He exhibits no tenderness.  GI: Soft. Bowel sounds are absent. There is generalized tenderness. There is guarding (voluntary guarding lower abdomen). There is no rigidity and no rebound. Hernia confirmed negative in the ventral area.  Lymphadenopathy:    He has no cervical adenopathy.  Skin: He is not diaphoretic.    Assessment/Plan: Abdominal pain Gaseous distention of colon with ischemia Constipation/Obstipation Immunosuppression with orencia for RA  Will try antibiotics and IV fluids to see if this is reversible.  He has started passing gas and if he can get significant decrease in his   If he does not turn around quickly, he will need surgery.     Estela Vinal 12/16/2015, 12:54 AM

## 2015-12-17 LAB — CBC
HEMATOCRIT: 29.3 % — AB (ref 39.0–52.0)
HEMOGLOBIN: 9.1 g/dL — AB (ref 13.0–17.0)
MCH: 26.1 pg (ref 26.0–34.0)
MCHC: 31.1 g/dL (ref 30.0–36.0)
MCV: 84.2 fL (ref 78.0–100.0)
Platelets: 244 10*3/uL (ref 150–400)
RBC: 3.48 MIL/uL — ABNORMAL LOW (ref 4.22–5.81)
RDW: 15 % (ref 11.5–15.5)
WBC: 17.2 10*3/uL — ABNORMAL HIGH (ref 4.0–10.5)

## 2015-12-17 LAB — BASIC METABOLIC PANEL
Anion gap: 12 (ref 5–15)
BUN: 15 mg/dL (ref 6–20)
CALCIUM: 8.5 mg/dL — AB (ref 8.9–10.3)
CHLORIDE: 104 mmol/L (ref 101–111)
CO2: 21 mmol/L — AB (ref 22–32)
CREATININE: 1.41 mg/dL — AB (ref 0.61–1.24)
GFR calc non Af Amer: 52 mL/min — ABNORMAL LOW (ref >60)
GFR, EST AFRICAN AMERICAN: 60 mL/min — AB (ref >60)
GLUCOSE: 66 mg/dL (ref 65–99)
Potassium: 3.7 mmol/L (ref 3.5–5.1)
Sodium: 137 mmol/L (ref 135–145)

## 2015-12-17 LAB — GLUCOSE, CAPILLARY
GLUCOSE-CAPILLARY: 95 mg/dL (ref 65–99)
Glucose-Capillary: 121 mg/dL — ABNORMAL HIGH (ref 65–99)

## 2015-12-17 MED ORDER — DEXTROSE-NACL 5-0.9 % IV SOLN
INTRAVENOUS | Status: AC
  Administered 2015-12-17: 75 mL/h via INTRAVENOUS
  Filled 2015-12-17: qty 1000

## 2015-12-17 NOTE — Progress Notes (Signed)
[address] [place], [place] [address] Phone: [phone] FAX: [phone]   Kevin Lopez [id number] Sep 16, 1950  CARE TEAM:  PCP: [name], MD  Outpatient Care Team: Patient Care Team: [name], MD as PCP - General (Internal Medicine)  Inpatient Treatment Team: Treatment Team: Attending Provider: Modena Jansky, MD; Consulting Physician: Danie Binder, MD; Rounding Team: Nolon Nations, MD; Technician: Meredith Staggers, NT; Rounding Team: Minerva Ends, MD; Technician: Neoma Laming, NT; Registered Nurse: Willodean Rosenthal, RN; Registered Nurse: Meryle Ready, RN  Problem List:   Principal Problem:   Ischemic bowel disease [facility]) Active Problems:   Hypertension   SBO (small bowel obstruction) s/p ex lap in past   Leukocytosis   Cancer of left colon s/p colectomy/ostomy, and ostomy takedown   2 Days Post-Op  12/15/2015  Procedure(s): FLEXIBLE SIGMOIDOSCOPY   Assessment  Ischemic colitis with colonic ileus ? [facility]'s  Plan:  -NGT - clamping trail with low vol  -IVF -IV ABx - Zosyn #2/... -check labs -control pain - serial exams -VTE prophylaxis- SCDs, etc -mobilize as tolerated to help recovery  -prob OK to go to floor if medicine agrees  -if worsens, ex lap with colectomy/ostomy.  Try hold off & see if Ab x can correct, especially in a hostile abdomen.  #BMs & lower WBC a hopeful sin.  Pt wishes to avoid another operation if possible.  I updated the patient's status to the patient, ICU RN.  Recommendations were made.  Questions were answered.  They expressed understanding & appreciation.   [name], M.D., F.A.C.S. Gastrointestinal and Minimally Invasive Surgery [facility] [address] [place], [place] [address] [phone] Main / Paging   12/17/2015  Subjective:  Less Sore Less Distended RN at  bedside  Objective:  Vital signs:  Vitals:   12/16/15 1945 12/16/15 2300 12/17/15 0319 12/17/15 0700  BP: 130/77 133/76 139/80 (!) 158/93  Pulse: 69 70 62 66  Resp: 15 14 14 14   Temp: 98.3 F (36.8 C) 98.2 F (36.8 C) 98.5 F (36.9 C) 97.9 F (36.6 C)  TempSrc: Oral Oral Oral Oral  SpO2: 98% 99% 96%   Weight:      Height:        Last BM Date: 12/17/15  Intake/Output   Yesterday:  07/29 0701 - 07/30 0700 In: 375 [I.V.:375] Out: -  This shift:  No intake/output data recorded.  Bowel function:  Flatus: YES  BM:  YES - many loose  Drain: NGT scant thick brown   Physical Exam:  General: Pt awake/alert/oriented x4 in mild acute distress Eyes: PERRL, normal EOM.  Sclera clear.  No icterus Neuro: CN II-XII intact w/o focal sensory/motor deficits. Lymph: No head/neck/groin lymphadenopathy Psych:  No delerium/psychosis/paranoia HENT: Normocephalic, Mucus membranes moist.  No thrush Neck: Supple, No tracheal deviation Chest: No chest wall pain w good excursion CV:  Pulses intact.  Regular rhythm MS: Normal AROM mjr joints.  No obvious deformity Abdomen: Somewhat firm.  Moderately distended.  Tenderness at diffuse but less.  No guarding/rebound.  No evidence of peritonitis.  No incarcerated hernias. Ext:  SCDs BLE.  No mjr edema.  No cyanosis Skin: No petechiae / purpura  Results:   Labs: Results for orders placed or performed during the hospital encounter of 12/15/15 (from the past 48 hour(s))  Lactic acid, plasma     Status: None   Collection Time: 12/15/15  2:14 PM  Result Value Ref Range   Lactic Acid, Venous 1.3 0.5 - 1.9 mmol/L  Lactic acid, plasma     Status: None   Collection Time: 12/15/15  4:44 PM  Result Value Ref Range   Lactic Acid, Venous 1.2 0.5 - 1.9 mmol/L  CBC     Status: Abnormal   Collection Time: 12/16/15  9:47 AM  Result Value Ref Range   WBC 21.4 (H) 4.0 - 10.5 K/uL   RBC 4.25 4.22 - 5.81 MIL/uL   Hemoglobin 11.3 (L) 13.0 - 17.0 g/dL    HCT 35.6 (L) 39.0 - 52.0 %   MCV 83.8 78.0 - 100.0 fL   MCH 26.6 26.0 - 34.0 pg   MCHC 31.7 30.0 - 36.0 g/dL   RDW 15.1 11.5 - 15.5 %   Platelets 281 150 - 400 K/uL  Basic metabolic panel     Status: Abnormal   Collection Time: 12/16/15  9:47 AM  Result Value Ref Range   Sodium 137 135 - 145 mmol/L   Potassium 3.9 3.5 - 5.1 mmol/L   Chloride 107 101 - 111 mmol/L   CO2 21 (L) 22 - 32 mmol/L   Glucose, Bld 83 65 - 99 mg/dL   BUN 12 6 - 20 mg/dL   Creatinine, Ser 1.36 (H) 0.61 - 1.24 mg/dL   Calcium 8.6 (L) 8.9 - 10.3 mg/dL   GFR calc non Af Amer 54 (L) >60 mL/min   GFR calc Af Amer >60 >60 mL/min    Comment: (NOTE) The eGFR has been calculated using the CKD EPI equation. This calculation has not been validated in all clinical situations. eGFR's persistently <60 mL/min signify possible Chronic Kidney Disease.    Anion gap 9 5 - 15  CBC     Status: Abnormal   Collection Time: 12/17/15  3:05 AM  Result Value Ref Range   WBC 17.2 (H) 4.0 - 10.5 K/uL   RBC 3.48 (L) 4.22 - 5.81 MIL/uL   Hemoglobin 9.1 (L) 13.0 - 17.0 g/dL   HCT 29.3 (L) 39.0 - 52.0 %   MCV 84.2 78.0 - 100.0 fL   MCH 26.1 26.0 - 34.0 pg   MCHC 31.1 30.0 - 36.0 g/dL   RDW 15.0 11.5 - 15.5 %   Platelets 244 150 - 400 K/uL  Basic metabolic panel     Status: Abnormal   Collection Time: 12/17/15  3:05 AM  Result Value Ref Range   Sodium 137 135 - 145 mmol/L   Potassium 3.7 3.5 - 5.1 mmol/L   Chloride 104 101 - 111 mmol/L   CO2 21 (L) 22 - 32 mmol/L   Glucose, Bld 66 65 - 99 mg/dL   BUN 15 6 - 20 mg/dL   Creatinine, Ser 1.41 (H) 0.61 - 1.24 mg/dL   Calcium 8.5 (L) 8.9 - 10.3 mg/dL   GFR calc non Af Amer 52 (L) >60 mL/min   GFR calc Af Amer 60 (L) >60 mL/min    Comment: (NOTE) The eGFR has been calculated using the CKD EPI equation. This calculation has not been validated in all clinical situations. eGFR's persistently <60 mL/min signify possible Chronic Kidney Disease.    Anion gap 12 5 - 15    Imaging  / Studies: Ct Abdomen Pelvis Wo Contrast  Result Date: 12/15/2015 CLINICAL DATA:  Generalized abdominal pain. EXAM: CT ABDOMEN AND PELVIS WITHOUT CONTRAST TECHNIQUE: Multidetector CT imaging of the abdomen and pelvis was performed following the standard protocol without IV contrast. COMPARISON:  Plain films 12/15/2015.  CT 05/21/2010 FINDINGS: Lower chest: Dependent atelectasis at the right base. Elevation of the right hemidiaphragm. Left lung base is clear. Heart is borderline in size. No effusions. Small hiatal hernia. Hepatobiliary: No focal hepatic abnormality. Gallbladder unremarkable. Pancreas: No focal abnormality or ductal dilatation. Spleen: No focal abnormality.  Normal size. Adrenals/Urinary Tract: No adrenal abnormality. No focal renal abnormality. No stones or hydronephrosis. Urinary bladder is unremarkable. Stomach/Bowel: Rectal contrast was administered. Significant gaseous distention of the colon, most pronounced in the transverse colon which measures up to 11 cm. Moderate stool burden in the colon. Small bowel and stomach decompressed. No visible obstructing colonic lesion. Vascular/Lymphatic: Scattered aortic and iliac calcifications. No aneurysm. No adenopathy. Reproductive:  No visible focal abnormality. Other: Small amount of free fluid in the right side of the pelvis. No free air. Musculoskeletal: No acute bony abnormality or focal bone lesion IMPRESSION: Marked gaseous distention of the colon with moderate stool burden. No visible obstructing process. Transverse colon measures up to 11 cm in diameter. Small hiatal hernia.  Right base atelectasis. Small amount of free fluid in the pelvis. Electronically Signed   By: Rolm Baptise M.D.   On: 12/15/2015 13:29  Dg Abd Acute W/chest  Addendum Date: 12/15/2015   ADDENDUM REPORT: 12/15/2015 11:09 ADDENDUM: The dilated bowel in the left upper abdomen potentially could represent a focus of volvulated sigmoid colon. Given this possibility,  correlation with CT advised. These results were called by telephone at the time of interpretation on 12/15/2015 at 11:08 am to [facility] , who verbally acknowledged these results. Electronically Signed   By: Lowella Grip III M.D.   On: 12/15/2015 11:09  Result Date: 12/15/2015 CLINICAL DATA:  Generalized abdominal pain and distention with vomiting for 4 days EXAM: DG ABDOMEN ACUTE W/ 1V CHEST COMPARISON:  Abdomen series May 24, 2010 FINDINGS: PA chest: No edema or consolidation. Heart size and pulmonary vascularity are normal. No adenopathy. Supine and upright abdomen: There are multiple loops of dilated colon with multiple air-fluid levels. No free air evident. There are small phleboliths in the pelvis. IMPRESSION: Loops of dilated colon with multiple air-fluid levels. This appearance is concerning for distal bowel obstruction. Ileus is a differential consideration. No free air evident. Lungs clear. Electronically Signed: By: Lowella Grip III M.D. On: 12/15/2015 11:02  Dg Abd Portable 1v  Result Date: 12/16/2015 CLINICAL DATA:  Constipation. History of diverticulosis and small bowel obstruction EXAM: PORTABLE ABDOMEN - 1 VIEW COMPARISON:  CT earlier today FINDINGS: Diffuse gaseous distention of the colon again noted, most pronounced in the transverse colon. There is suggestion of pneumatosis within the right colonic wall as seen on CT earlier today. No free air. No organomegaly. NG tube tip is in the distal stomach or proximal duodenum. IMPRESSION: Stable gaseous distention of the colon. Questionable pneumatosis in the right colon as seen on earlier CT. Electronically Signed   By: Rolm Baptise M.D.   On: 12/16/2015 15:24  Dg Abd Portable 1 View  Result Date: 12/15/2015 CLINICAL DATA:  NG tube placement. EXAM: PORTABLE ABDOMEN - 1 VIEW COMPARISON:  None. FINDINGS: NG tube tip is in the distal stomach with the side port in the stomach. Continued gaseous distention of colon, particularly  the transverse colon. No free air organomegaly. IMPRESSION: NG tube tip in the distal stomach. Electronically Signed   By: Rolm Baptise M.D.   On: 12/15/2015 16:37  Ct Angio Abd/pel W/ And/or W/o  Result Date: 12/16/2015 CLINICAL DATA:  Mid to lower abdominal pain with bloating. Unsuccessful colonoscopy due to ischemic bowel. EXAM: CTA ABDOMEN AND PELVIS wITHOUT AND WITH CONTRAST TECHNIQUE: Multidetector CT imaging of the abdomen and pelvis was performed using the standard protocol during bolus administration of intravenous contrast. Multiplanar reconstructed images and MIPs were obtained and reviewed to evaluate the vascular anatomy. CONTRAST:  100 cc Isovue 370 IV COMPARISON:  12/15/2015 FINDINGS: Lower chest:  Cardiomegaly.  Bibasilar atelectasis.  No effusions. Hepatobiliary: No focal hepatic abnormality. Gallbladder unremarkable. Pancreas: No focal abnormality or ductal dilatation. Spleen: No focal abnormality.  Normal size. Adrenals/Urinary Tract: No adrenal abnormality. No focal renal abnormality. No stones or hydronephrosis. Urinary bladder is unremarkable. Stomach/Bowel: The NG tube is present with the tip in the proximal duodenum. There is mild diffuse wall thickening in the visualized distal esophagus which may be related to esophagitis. Continued dilatation of the colon, slightly decreased since prior study. The transverse colon diameter measures 9.9 cm today compared with 11 cm yesterday. Questionable pneumatosis noted within the cecal wall and descending colon. Vascular/Lymphatic: Aorta is normal caliber. The celiac artery, superior mesenteric artery, inferior mesenteric artery are widely patent. Single renal arteries bilaterally which are widely patent. Iliofemoral vessels widely patent. Reproductive: No visible focal abnormality. Other: Small amount of free fluid noted within the pelvis, similar to prior study. Musculoskeletal: No acute bony abnormality or focal bone lesion. Review of the MIP  images confirms the above findings. IMPRESSION: Questionable pneumatosis within the colonic wall, best seen in the cecum and descending colon. Continued dilatation of the colon, slightly decreased since prior study. NG tube tip is in the proximal duodenum. No evidence of mesenteric arterial stenosis/occlusion. Electronically Signed   By: Rolm Baptise M.D.   On: 12/16/2015 07:33   Medications / Allergies: per chart  Antibiotics: Anti-infectives    Start     Dose/Rate Route Frequency Ordered Stop   12/15/15 2100  piperacillin-tazobactam (ZOSYN) IVPB 3.375 g     3.375 g 12.5 mL/hr over 240 Minutes Intravenous Every 8 hours 12/15/15 1958          Note: Portions of this report may have been transcribed using voice recognition software. Every effort was made to ensure accuracy; however, inadvertent computerized transcription errors may be present.   Any transcriptional errors that result from this process are unintentional.     [name], M.D., F.A.C.S. Gastrointestinal and Minimally Invasive Surgery [facility] [address] [place], [place] [address] [phone] Main / Paging   12/17/2015

## 2015-12-17 NOTE — Progress Notes (Signed)
Patient continues to request until "later" for the rectal suppository.

## 2015-12-17 NOTE — Progress Notes (Signed)
PROGRESS NOTE  Costas Vannice  [id number] DOB: [birth year]  DOA: 12/15/2015 PCP: [name], MD   Brief Narrative:  65 year old male with PMH of HTN, rheumatoid arthritis (follows with rheumatology), multiple abdominal surgeries, prior episodes of SBO, initially presented to [facility] with an episode of colonic ileus. He was evaluated by GI (Dr. [name]) and general surgery who advised transfer to [facility] for further evaluation and management of ischemic colitis associated with colonic ileus. General surgery consulting. Admitted to stepdown.   Assessment & Plan:   Principal Problem:   Ischemic bowel disease ([place]) Active Problems:   Hypertension   SBO (small bowel obstruction) s/p ex lap in past   Leukocytosis   Cancer of left colon s/p colectomy/ostomy, and ostomy takedown   Ischemic colitis with colonic ileus - Initially presented to the [facility] and evaluated by GI and general surgery - Unsuccessful flexible sigmoidoscopy at [facility]. - Transferred to [facility] and admitted to stepdown unit. General surgery consultation & follow-up appreciated.  - Treating conservatively with bowel rest, NG tube, IV fluids, IV Zosyn and pain management. Mobilize as tolerated. Hopefully can resolve with conservative management otherwise will need surgery. - No mesenteric artery occlusion on CT abdomen. Lactate normal. - On 7/30: Some improvement. Had a BM, passing flatus, abdomen feels better. Surgery clamping NG tube. Mobilize.  Essential hypertension - Reasonably controlled. Continue IV metoprolol scheduled and when necessary IV hydralazine. Home amlodipine and ARB held secondary to nothing by mouth  Rheumatoid arthritis - Sees outpatient rheumatology. Hold Abatacept due to ischemic colitis.  Anemia - Hemoglobin has dropped from 11-9. Likely from critical illness. No overt bleeding. Follow CBC in a.m. Transfuse if hemoglobin less than signed 8 g per  DL.  Acute kidney injury versus stage II chronic kidney disease - Recent baseline creatinine not known. Presented with creatinine of 1.26 which has mildly increased to 1.3. >1.4. IV fluids and follow BMP.   DVT prophylaxis: SCDs. Code Status: Full Family Communication: Discussed with patient. No family at bedside. Disposition Plan: Remains in stepdown unit for additional 24 hours.   Consultants:   General surgery  Procedures:   NG tube  Antimicrobials:   IV Zosyn 7/28 >    Subjective: Had BM and flatus this morning. Abdominal distention and pain slightly improved.  Objective:  Vitals:   12/16/15 2300 12/17/15 0319 12/17/15 0700 12/17/15 1100  BP: 133/76 139/80 (!) 158/93 (!) 151/82  Pulse: 70 62 66 64  Resp: 14 14 14 13   Temp: 98.2 F (36.8 C) 98.5 F (36.9 C) 97.9 F (36.6 C) 98.3 F (36.8 C)  TempSrc: Oral Oral Oral Oral  SpO2: 99% 96%  97%  Weight:      Height:        Intake/Output Summary (Last 24 hours) at 12/17/15 1144 Last data filed at 12/17/15 1039  Gross per 24 hour  Intake              120 ml  Output                0 ml  Net              120 ml   Filed Weights   12/15/15 0943 12/15/15 1821 12/15/15 2255  Weight: 78.9 kg (174 lb) 75.8 kg (167 lb 1.7 oz) 76.9 kg (169 lb 8.5 oz)    Examination:  General exam: Pleasant middle-aged male lying on comfortably propped up in bed. Respiratory system: Clear to auscultation. Respiratory  effort normal. Cardiovascular system: S1 & S2 heard, RRR. No JVD, murmurs, rubs, gallops or clicks. No pedal edema. Telemetry: Sinus rhythm. Gastrointestinal system: Abdomen is less distended, still diffusely tender with guarding but no rebound and good bowel sounds heard. No organomegaly or masses felt. Extensive laparotomy scar. Central nervous system: Alert and oriented. No focal neurological deficits. Extremities: Symmetric 5 x 5 power. Skin: No rashes, lesions or ulcers Psychiatry: Judgement and insight appear  normal. Mood & affect appropriate.     Data Reviewed: I have personally reviewed following labs and imaging studies  CBC:  Recent Labs Lab 12/15/15 0947 12/16/15 0947 12/17/15 0305  WBC 19.4* 21.4* 17.2*  HGB 11.9* 11.3* 9.1*  HCT 36.0* 35.6* 29.3*  MCV 81.3 83.8 84.2  PLT 311 281 [id number]   Basic Metabolic Panel:  Recent Labs Lab 12/15/15 0947 12/16/15 0947 12/17/15 0305  NA 139 137 137  K 3.5 3.9 3.7  CL 103 107 104  CO2 27 21* 21*  GLUCOSE 142* 83 66  BUN 16 12 15   CREATININE 1.26* 1.36* 1.41*  CALCIUM 9.4 8.6* 8.5*   GFR: Estimated Creatinine Clearance: 53.6 mL/min (by C-G formula based on SCr of 1.41 mg/dL). Liver Function Tests:  Recent Labs Lab 12/15/15 0947  AST 19  ALT 19  ALKPHOS 43  BILITOT 1.1  PROT 7.9  ALBUMIN 4.5    Recent Labs Lab 12/15/15 0947  LIPASE 32   No results for input(s): AMMONIA in the last 168 hours. Coagulation Profile: No results for input(s): INR, PROTIME in the last 168 hours. Cardiac Enzymes: No results for input(s): CKTOTAL, CKMB, CKMBINDEX, TROPONINI in the last 168 hours. BNP (last 3 results) No results for input(s): PROBNP in the last 8760 hours. HbA1C: No results for input(s): HGBA1C in the last 72 hours. CBG: No results for input(s): GLUCAP in the last 168 hours. Lipid Profile: No results for input(s): CHOL, HDL, LDLCALC, TRIG, CHOLHDL, LDLDIRECT in the last 72 hours. Thyroid Function Tests:  Recent Labs  12/15/15 0947  TSH 1.094   Anemia Panel: No results for input(s): VITAMINB12, FOLATE, FERRITIN, TIBC, IRON, RETICCTPCT in the last 72 hours.  Sepsis Labs:  Recent Labs Lab 12/15/15 1414 12/15/15 1644  LATICACIDVEN 1.3 1.2    No results found for this or any previous visit (from the past 240 hour(s)).       Radiology Studies: Ct Abdomen Pelvis Wo Contrast  Result Date: 12/15/2015 CLINICAL DATA:  Generalized abdominal pain. EXAM: CT ABDOMEN AND PELVIS WITHOUT CONTRAST TECHNIQUE:  Multidetector CT imaging of the abdomen and pelvis was performed following the standard protocol without IV contrast. COMPARISON:  Plain films 12/15/2015.  CT 05/21/2010 FINDINGS: Lower chest: Dependent atelectasis at the right base. Elevation of the right hemidiaphragm. Left lung base is clear. Heart is borderline in size. No effusions. Small hiatal hernia. Hepatobiliary: No focal hepatic abnormality. Gallbladder unremarkable. Pancreas: No focal abnormality or ductal dilatation. Spleen: No focal abnormality.  Normal size. Adrenals/Urinary Tract: No adrenal abnormality. No focal renal abnormality. No stones or hydronephrosis. Urinary bladder is unremarkable. Stomach/Bowel: Rectal contrast was administered. Significant gaseous distention of the colon, most pronounced in the transverse colon which measures up to 11 cm. Moderate stool burden in the colon. Small bowel and stomach decompressed. No visible obstructing colonic lesion. Vascular/Lymphatic: Scattered aortic and iliac calcifications. No aneurysm. No adenopathy. Reproductive:  No visible focal abnormality. Other: Small amount of free fluid in the right side of the pelvis. No free air. Musculoskeletal: No acute bony abnormality  or focal bone lesion IMPRESSION: Marked gaseous distention of the colon with moderate stool burden. No visible obstructing process. Transverse colon measures up to 11 cm in diameter. Small hiatal hernia.  Right base atelectasis. Small amount of free fluid in the pelvis. Electronically Signed   By: Rolm Baptise M.D.   On: 12/15/2015 13:29  Dg Abd Portable 1v  Result Date: 12/16/2015 CLINICAL DATA:  Constipation. History of diverticulosis and small bowel obstruction EXAM: PORTABLE ABDOMEN - 1 VIEW COMPARISON:  CT earlier today FINDINGS: Diffuse gaseous distention of the colon again noted, most pronounced in the transverse colon. There is suggestion of pneumatosis within the right colonic wall as seen on CT earlier today. No free air. No  organomegaly. NG tube tip is in the distal stomach or proximal duodenum. IMPRESSION: Stable gaseous distention of the colon. Questionable pneumatosis in the right colon as seen on earlier CT. Electronically Signed   By: Rolm Baptise M.D.   On: 12/16/2015 15:24  Dg Abd Portable 1 View  Result Date: 12/15/2015 CLINICAL DATA:  NG tube placement. EXAM: PORTABLE ABDOMEN - 1 VIEW COMPARISON:  None. FINDINGS: NG tube tip is in the distal stomach with the side port in the stomach. Continued gaseous distention of colon, particularly the transverse colon. No free air organomegaly. IMPRESSION: NG tube tip in the distal stomach. Electronically Signed   By: Rolm Baptise M.D.   On: 12/15/2015 16:37  Ct Angio Abd/pel W/ And/or W/o  Result Date: 12/16/2015 CLINICAL DATA:  Mid to lower abdominal pain with bloating. Unsuccessful colonoscopy due to ischemic bowel. EXAM: CTA ABDOMEN AND PELVIS wITHOUT AND WITH CONTRAST TECHNIQUE: Multidetector CT imaging of the abdomen and pelvis was performed using the standard protocol during bolus administration of intravenous contrast. Multiplanar reconstructed images and MIPs were obtained and reviewed to evaluate the vascular anatomy. CONTRAST:  100 cc Isovue 370 IV COMPARISON:  12/15/2015 FINDINGS: Lower chest:  Cardiomegaly.  Bibasilar atelectasis.  No effusions. Hepatobiliary: No focal hepatic abnormality. Gallbladder unremarkable. Pancreas: No focal abnormality or ductal dilatation. Spleen: No focal abnormality.  Normal size. Adrenals/Urinary Tract: No adrenal abnormality. No focal renal abnormality. No stones or hydronephrosis. Urinary bladder is unremarkable. Stomach/Bowel: The NG tube is present with the tip in the proximal duodenum. There is mild diffuse wall thickening in the visualized distal esophagus which may be related to esophagitis. Continued dilatation of the colon, slightly decreased since prior study. The transverse colon diameter measures 9.9 cm today compared with 11  cm yesterday. Questionable pneumatosis noted within the cecal wall and descending colon. Vascular/Lymphatic: Aorta is normal caliber. The celiac artery, superior mesenteric artery, inferior mesenteric artery are widely patent. Single renal arteries bilaterally which are widely patent. Iliofemoral vessels widely patent. Reproductive: No visible focal abnormality. Other: Small amount of free fluid noted within the pelvis, similar to prior study. Musculoskeletal: No acute bony abnormality or focal bone lesion. Review of the MIP images confirms the above findings. IMPRESSION: Questionable pneumatosis within the colonic wall, best seen in the cecum and descending colon. Continued dilatation of the colon, slightly decreased since prior study. NG tube tip is in the proximal duodenum. No evidence of mesenteric arterial stenosis/occlusion. Electronically Signed   By: Rolm Baptise M.D.   On: 12/16/2015 07:33       Scheduled Meds: . bisacodyl  10 mg Rectal Daily  . metoprolol  5 mg Intravenous Q6H  . piperacillin-tazobactam  3.375 g Intravenous Q8H  . sodium chloride  1,000 mL Intravenous Once   Continuous  Infusions:     LOS: 2 days    Time spent: 25 minutes.    [facility], MD [facility] Pager [phone]  If 7PM-7AM, please contact night-coverage [email or web address] Password [email or web address] 12/17/2015, 11:44 AM

## 2015-12-18 ENCOUNTER — Inpatient Hospital Stay (HOSPITAL_COMMUNITY): Payer: Medicare Other

## 2015-12-18 DIAGNOSIS — E876 Hypokalemia: Secondary | ICD-10-CM

## 2015-12-18 LAB — MRSA PCR SCREENING: MRSA by PCR: NEGATIVE

## 2015-12-18 LAB — GLUCOSE, CAPILLARY
GLUCOSE-CAPILLARY: 110 mg/dL — AB (ref 65–99)
GLUCOSE-CAPILLARY: 112 mg/dL — AB (ref 65–99)
GLUCOSE-CAPILLARY: 146 mg/dL — AB (ref 65–99)
Glucose-Capillary: 117 mg/dL — ABNORMAL HIGH (ref 65–99)
Glucose-Capillary: 130 mg/dL — ABNORMAL HIGH (ref 65–99)
Glucose-Capillary: 116 mg/dL — ABNORMAL HIGH (ref 65–99)

## 2015-12-18 LAB — BASIC METABOLIC PANEL
ANION GAP: 8 (ref 5–15)
BUN: 7 mg/dL (ref 6–20)
CO2: 26 mmol/L (ref 22–32)
Calcium: 8.3 mg/dL — ABNORMAL LOW (ref 8.9–10.3)
Chloride: 102 mmol/L (ref 101–111)
Creatinine, Ser: 1.14 mg/dL (ref 0.61–1.24)
GFR calc Af Amer: >60 mL/min (ref >60)
Glucose, Bld: 126 mg/dL — ABNORMAL HIGH (ref 65–99)
POTASSIUM: 3.4 mmol/L — AB (ref 3.5–5.1)
SODIUM: 136 mmol/L (ref 135–145)

## 2015-12-18 LAB — CBC
HCT: 27.9 % — ABNORMAL LOW (ref 39.0–52.0)
Hemoglobin: 8.9 g/dL — ABNORMAL LOW (ref 13.0–17.0)
MCH: 26.4 pg (ref 26.0–34.0)
MCHC: 31.9 g/dL (ref 30.0–36.0)
MCV: 82.8 fL (ref 78.0–100.0)
PLATELETS: 239 10*3/uL (ref 150–400)
RBC: 3.37 MIL/uL — AB (ref 4.22–5.81)
RDW: 14.7 % (ref 11.5–15.5)
WBC: 12.2 10*3/uL — AB (ref 4.0–10.5)

## 2015-12-18 MED FILL — Ondansetron HCl Inj 4 MG/2ML (2 MG/ML): INTRAMUSCULAR | Qty: 2 | Status: AC

## 2015-12-18 NOTE — Progress Notes (Addendum)
PROGRESS NOTE  Kendrix Larocca  [id number] DOB: 08/27/1950  DOA: 12/15/2015 PCP: [name], MD   Brief Narrative:  65 year old male with PMH of HTN, rheumatoid arthritis (follows with rheumatology), multiple abdominal surgeries, prior episodes of SBO, initially presented to [facility] with an episode of colonic ileus. He was evaluated by GI (Dr. [name]) and general surgery who advised transfer to [facility] for further evaluation and management of ischemic colitis associated with colonic ileus. General surgery consulting. Admitted to stepdown.   Assessment & Plan:   Principal Problem:   Ischemic bowel disease ([place]) Active Problems:   Hypertension   SBO (small bowel obstruction) s/p ex lap in past   Leukocytosis   Cancer of left colon s/p colectomy/ostomy, and ostomy takedown   Ischemic colitis with colonic ileus - Initially presented to the [facility] and evaluated by GI and general surgery - Unsuccessful flexible sigmoidoscopy at [facility]. - Transferred to [facility] and admitted to stepdown unit. General surgery consultation & follow-up appreciated.  - Treating conservatively with bowel rest, NG tube, IV fluids, IV Zosyn and pain management. Mobilize as tolerated. Hopefully can resolve with conservative management otherwise will need surgery. - No mesenteric artery occlusion on CT abdomen. Lactate normal. - On 7/30: Some improvement. Had a BM, passing flatus, abdomen feels better. Surgery clamping NG tube. Mobilize. - On 7/31: Tolerating clear liquids, had a small BM 731 morning, abdominal distention slightly better but pain unchanged compared to yesterday. As per surgery follow-up, continue conservative treatment and if no improvement by this afternoon, considering repeat CT abdomen to rule out hyperlipidemia.  Essential hypertension - Reasonably controlled. Continue IV metoprolol scheduled and when necessary IV hydralazine. Home amlodipine and ARB  held secondary to nothing by mouth. Continue parenteral medications until able to consistently and safely take by mouth medications.  Rheumatoid arthritis/left knee acute arthritis - Sees outpatient rheumatology. Hold Abatacept due to ischemic colitis. - Complaints of left knee pain and indicates that his wife brought in and self-administered Orencia injection in the hospital on 12/17/15. Patient was counseled that he should not take any medications without dose being prescribed by the medical team in the hospital. - Symptomatic treatment.  - Discussed with primary rheumatologist [name] on 7/31: Agrees with holding Abatacept due to concern for infection. Recommends when necessary intra-articular steroids by orthopedics if worsens. She stated that when she saw him 2-3 weeks ago in the office, his rheumatoid arthritis was not controlled and they were checking with his insurance and contemplating changing regimen. He has to follow with her as outpatient post discharge.  Anemia - Hemoglobin has dropped from 11-9. Likely from critical illness. No overt bleeding. Transfuse if hemoglobin less than signed 8 g per DL. Hemoglobin stable over last 24 hours.  Acute kidney injury versus stage II chronic kidney disease - Recent baseline creatinine not known. Presented with creatinine of 1.26 . Creatinine has normalized after IV fluids. Continue gentle IV fluid hydration.  Hypokalemia - Replace and follow.  DVT prophylaxis: SCDs. Code Status: Full Family Communication: Discussed with patient. No family at bedside. Disposition Plan: Remains in stepdown unit.   Consultants:   General surgery  Procedures:   NG tube  Antimicrobials:   IV Zosyn 7/28 >    Subjective: Tolerating clear liquids, had a small BM 7/31 morning, abdominal distention slightly better but pain unchanged compared to yesterday.  Objective:  Vitals:   12/18/15 0733 12/18/15 0900 12/18/15 1000 12/18/15 1100  BP: (!)  135/58  Pulse: (!) 57 68 64 60  Resp: 14 15 17 17   Temp: 98.2 F (36.8 C)     TempSrc: Oral     SpO2: 100% 97% 99% 96%  Weight:      Height:        Intake/Output Summary (Last 24 hours) at 12/18/15 1156 Last data filed at 12/18/15 1054  Gross per 24 hour  Intake          2153.75 ml  Output              877 ml  Net          1276.75 ml   Filed Weights   12/15/15 0943 12/15/15 1821 12/15/15 2255  Weight: 78.9 kg (174 lb) 75.8 kg (167 lb 1.7 oz) 76.9 kg (169 lb 8.5 oz)    Examination:  General exam: Pleasant middle-aged male lying on uncomfortably propped up in bed. In mild painful distress. Respiratory system: Clear to auscultation. Respiratory effort normal. Cardiovascular system: S1 & S2 heard, RRR. No JVD, murmurs, rubs, gallops or clicks. No pedal edema. Telemetry: Sinus rhythm. Gastrointestinal system: Abdomen is moderately distended distended, still diffusely tender with guarding but no rebound and good bowel sounds heard. No organomegaly or masses felt. Extensive laparotomy scar. Central nervous system: Alert and oriented. No focal neurological deficits. Extremities: Symmetric 5 x 5 power. Left knee with mild swelling, warmth and tenderness. Skin: No rashes, lesions or ulcers Psychiatry: Judgement and insight appear normal. Mood & affect appropriate.     Data Reviewed: I have personally reviewed following labs and imaging studies  CBC:  Recent Labs Lab 12/15/15 0947 12/16/15 0947 12/17/15 0305 12/18/15 0608  WBC 19.4* 21.4* 17.2* 12.2*  HGB 11.9* 11.3* 9.1* 8.9*  HCT 36.0* 35.6* 29.3* 27.9*  MCV 81.3 83.8 84.2 82.8  PLT 311 281 244 [id number]   Basic Metabolic Panel:  Recent Labs Lab 12/15/15 0947 12/16/15 0947 12/17/15 0305 12/18/15 0608  NA 139 137 137 136  K 3.5 3.9 3.7 3.4*  CL 103 107 104 102  CO2 27 21* 21* 26  GLUCOSE 142* 83 66 126*  BUN 16 12 15 7   CREATININE 1.26* 1.36* 1.41* 1.14  CALCIUM 9.4 8.6* 8.5* 8.3*   GFR: Estimated Creatinine  Clearance: 66.3 mL/min (by C-G formula based on SCr of 1.14 mg/dL). Liver Function Tests:  Recent Labs Lab 12/15/15 0947  AST 19  ALT 19  ALKPHOS 43  BILITOT 1.1  PROT 7.9  ALBUMIN 4.5    Recent Labs Lab 12/15/15 0947  LIPASE 32   No results for input(s): AMMONIA in the last 168 hours. Coagulation Profile: No results for input(s): INR, PROTIME in the last 168 hours. Cardiac Enzymes: No results for input(s): CKTOTAL, CKMB, CKMBINDEX, TROPONINI in the last 168 hours. BNP (last 3 results) No results for input(s): PROBNP in the last 8760 hours. HbA1C: No results for input(s): HGBA1C in the last 72 hours. CBG:  Recent Labs Lab 12/17/15 1335 12/17/15 1734 12/18/15 0000 12/18/15 0505 12/18/15 0813  GLUCAP 121* 95 112* 146* 117*   Lipid Profile: No results for input(s): CHOL, HDL, LDLCALC, TRIG, CHOLHDL, LDLDIRECT in the last 72 hours. Thyroid Function Tests: No results for input(s): TSH, T4TOTAL, FREET4, T3FREE, THYROIDAB in the last 72 hours. Anemia Panel: No results for input(s): VITAMINB12, FOLATE, FERRITIN, TIBC, IRON, RETICCTPCT in the last 72 hours.  Sepsis Labs:  Recent Labs Lab 12/15/15 1414 12/15/15 1644  LATICACIDVEN 1.3 1.2    No results found for this  or any previous visit (from the past 240 hour(s)).       Radiology Studies: Dg Abd Portable 1v  Result Date: 12/16/2015 CLINICAL DATA:  Constipation. History of diverticulosis and small bowel obstruction EXAM: PORTABLE ABDOMEN - 1 VIEW COMPARISON:  CT earlier today FINDINGS: Diffuse gaseous distention of the colon again noted, most pronounced in the transverse colon. There is suggestion of pneumatosis within the right colonic wall as seen on CT earlier today. No free air. No organomegaly. NG tube tip is in the distal stomach or proximal duodenum. IMPRESSION: Stable gaseous distention of the colon. Questionable pneumatosis in the right colon as seen on earlier CT. Electronically Signed   By: Rolm Baptise M.D.   On: 12/16/2015 15:24       Scheduled Meds: . bisacodyl  10 mg Rectal Daily  . metoprolol  5 mg Intravenous Q6H  . piperacillin-tazobactam  3.375 g Intravenous Q8H   Continuous Infusions:     LOS: 3 days    Time spent: 25 minutes.    [facility], MD [facility] Pager [phone]  If 7PM-7AM, please contact night-coverage [email or web address] Password [email or web address] 12/18/2015, 11:56 AM

## 2015-12-18 NOTE — Progress Notes (Signed)
3 Days Post-Op  Subjective: Abdominal pain worse from yesterday. However, patient has been more active today than usual. Had BM today. Passing flatus. Denies nausea or vomiting. Tachypnea and bradycardia. WBC 12.2, trending down from 17.2 on 12/17/15.  Objective: Vital signs in last 24 hours: Temp:  [97.7 F (36.5 C)-98.8 F (37.1 C)] 98.2 F (36.8 C) (07/31 0733) Pulse Rate:  [55-70] 57 (07/31 0733) Resp:  [11-29] 14 (07/31 0733) BP: (135-147)/(52-71) 135/58 (07/31 0733) SpO2:  [96 %-100 %] 100 % (07/31 0733) Last BM Date: 12/17/15  Intake/Output from previous day: 07/30 0701 - 07/31 0700 In: 2273.8 [P.O.:1800; I.V.:423.8; IV Piggyback:50] Out: 500 [Urine:500] Intake/Output this shift: Total I/O In: -  Out: 377 [Urine:375; Stool:2]  General appearance  Sitting in chair. Awake and talkative.  Head: Normocephalic/atraumatic. PERRL bilaterally. Extraocular eye muscles in tact. NG tube clamped.   Neck: FROM Cardiovascular: Bradycardic with regular rhythm. No gallops, murmurs or rubs Respiratory: Lung sounds vesicular. No wheezes, crakles or rhonchi auscultated Abdomen: Soft. Diffusely tender to deep palpation. Distended. No guarding. No rigidity. +BS   Lab Results:   Recent Labs  12/17/15 0305 12/18/15 0608  WBC 17.2* 12.2*  HGB 9.1* 8.9*  HCT 29.3* 27.9*  PLT 244 239   BMET  Recent Labs  12/17/15 0305 12/18/15 0608  NA 137 136  K 3.7 3.4*  CL 104 102  CO2 21* 26  GLUCOSE 66 126*  BUN 15 7  CREATININE 1.41* 1.14  CALCIUM 8.5* 8.3*   PT/INR No results for input(s): LABPROT, INR in the last 72 hours. ABG No results for input(s): PHART, HCO3 in the last 72 hours.  Invalid input(s): PCO2, PO2  Studies/Results: Dg Abd Portable 1v  Result Date: 12/16/2015 CLINICAL DATA:  Constipation. History of diverticulosis and small bowel obstruction EXAM: PORTABLE ABDOMEN - 1 VIEW COMPARISON:  CT earlier today FINDINGS: Diffuse gaseous distention of the colon again  noted, most pronounced in the transverse colon. There is suggestion of pneumatosis within the right colonic wall as seen on CT earlier today. No free air. No organomegaly. NG tube tip is in the distal stomach or proximal duodenum. IMPRESSION: Stable gaseous distention of the colon. Questionable pneumatosis in the right colon as seen on earlier CT. Electronically Signed   By: Rolm Baptise M.D.   On: 12/16/2015 15:24   Anti-infectives: Anti-infectives    Start     Dose/Rate Route Frequency Ordered Stop   12/15/15 2100  piperacillin-tazobactam (ZOSYN) IVPB 3.375 g     3.375 g 12.5 mL/hr over 240 Minutes Intravenous Every 8 hours 12/15/15 1958        Assessment/Plan: Ischemic colitis with colonic ileus  Abdominal pain worse from yesterday. Will continue to treat conservatively with IV antibiotics and bowel rest for now. If no improvement by this afternoon, will consider repeat CT abdomen to rule out ischemia. NG clamping maintained for now.   VTE: SCDs FEN: Clears  ID: [email or web address] Day#3  Lannie Fields PASII  12/18/2015

## 2015-12-18 NOTE — Care Management Important Message (Signed)
Important Message  Patient Details  Name: Kevin Lopez MRN: [id number] Date of Birth: 06-04-1950   Medicare Important Message Given:  Yes    Loann Quill 12/18/2015, 3:28 PM

## 2015-12-18 NOTE — Progress Notes (Signed)
Spoke with Dr. Algis Liming and he has consulted with patients rheumatologist. Specialist is considering changing medications due to uncontrolled pain. Per instructions I have spoken with patient and wife and have instructed him to not take any medications that has not been prescribed by the hospital physicians. Knee pain will re-evaluated tomorrow and if necessary orthopedics may be consulted. Patient states he understands and the wife also acknowledges the conversation.

## 2015-12-18 NOTE — Care Management Note (Signed)
Case Management Note  Patient Details  Name: Kevin Lopez MRN: [id number] Date of Birth: May 26, 1950  Subjective/Objective:  Patient from home, presents with ischemic colitis with ileus, ng tube to suction ,npo, ivf's ,leukocytosis, dilaudid iv , Lopressor iv q6, iv zosyn, NCM will cont to follow for dc needs.                   Action/Plan:   Expected Discharge Date:                  Expected Discharge Plan:  [place]  In-House Referral:     Discharge planning Services  CM Consult  Post Acute Care Choice:    Choice offered to:     DME Arranged:    DME Agency:     HH Arranged:    [place] Agency:     Status of Service:  In process, will continue to follow  If discussed at Long Length of Stay Meetings, dates discussed:    Additional Comments:  Zenon Mayo, RN 12/18/2015, 4:39 PM

## 2015-12-18 NOTE — Progress Notes (Signed)
Patient requested weekly injection of Orencia. Paged Dr. Janifer Adie who returned page and stated that the patient had told him he received the injection from his wife, asked me to verify with patient. I have spoken with Kevin Lopez and he has said that his wife did bring the injection yesterday and gave him his medication. The med does not show up on the [facility] and there is no documentation shown that the staff was notified of the administration.

## 2015-12-18 NOTE — Progress Notes (Signed)
Patient doing well, up to chair and up to [facility] today multiple times. Vital signs are stable, fair appetite, abdomen still distended. NG tube flushed per orders. Patient had two soft BM today, no liquid stool. Pain controlled through PRN medication, patient also requested an ice pack for his left knee which seemed to help. No questions or concerns at this time, call bell is in reach, will continue to monitor.

## 2015-12-19 ENCOUNTER — Ambulatory Visit: Payer: Self-pay | Admitting: Surgery

## 2015-12-19 DIAGNOSIS — K560 Paralytic ileus: Secondary | ICD-10-CM

## 2015-12-19 DIAGNOSIS — K55039 Acute (reversible) ischemia of large intestine, extent unspecified: Secondary | ICD-10-CM

## 2015-12-19 DIAGNOSIS — M069 Rheumatoid arthritis, unspecified: Secondary | ICD-10-CM

## 2015-12-19 LAB — BASIC METABOLIC PANEL
ANION GAP: 9 (ref 5–15)
BUN: <5 mg/dL — ABNORMAL LOW (ref 6–20)
CO2: 24 mmol/L (ref 22–32)
Calcium: 8.8 mg/dL — ABNORMAL LOW (ref 8.9–10.3)
Chloride: 103 mmol/L (ref 101–111)
Creatinine, Ser: 1.27 mg/dL — ABNORMAL HIGH (ref 0.61–1.24)
GFR calc Af Amer: >60 mL/min (ref >60)
GFR calc non Af Amer: 58 mL/min — ABNORMAL LOW (ref >60)
GLUCOSE: 109 mg/dL — AB (ref 65–99)
POTASSIUM: 3.6 mmol/L (ref 3.5–5.1)
Sodium: 136 mmol/L (ref 135–145)

## 2015-12-19 LAB — CBC
HEMATOCRIT: 31.1 % — AB (ref 39.0–52.0)
Hemoglobin: 10.1 g/dL — ABNORMAL LOW (ref 13.0–17.0)
MCH: 26.6 pg (ref 26.0–34.0)
MCHC: 32.5 g/dL (ref 30.0–36.0)
MCV: 82.1 fL (ref 78.0–100.0)
Platelets: 290 10*3/uL (ref 150–400)
RBC: 3.79 MIL/uL — AB (ref 4.22–5.81)
RDW: 14.6 % (ref 11.5–15.5)
WBC: 13.7 10*3/uL — AB (ref 4.0–10.5)

## 2015-12-19 LAB — URIC ACID: Uric Acid, Serum: 4.4 mg/dL (ref 4.4–7.6)

## 2015-12-19 LAB — GLUCOSE, CAPILLARY
GLUCOSE-CAPILLARY: 110 mg/dL — AB (ref 65–99)
Glucose-Capillary: 115 mg/dL — ABNORMAL HIGH (ref 65–99)

## 2015-12-19 MED ORDER — METOPROLOL TARTRATE 5 MG/5ML IV SOLN: 5.0000 mg | Freq: Four times a day (QID) | INTRAVENOUS | Status: DC

## 2015-12-19 MED ORDER — SODIUM CHLORIDE 0.9 % IV SOLN
INTRAVENOUS | Status: AC
  Administered 2015-12-19: 60 mL/h via INTRAVENOUS

## 2015-12-19 MED ORDER — METHYLPREDNISOLONE SODIUM SUCC 125 MG IJ SOLR
125.0000 mg | Freq: Once | INTRAMUSCULAR | Status: AC
  Administered 2015-12-19: 125 mg via INTRAVENOUS
  Filled 2015-12-19: qty 2

## 2015-12-19 MED ORDER — PREDNISONE 5 MG PO TABS
5.0000 mg | ORAL_TABLET | Freq: Every day | ORAL | Status: DC
  Administered 2015-12-20 – 2015-12-21 (×2): 5 mg via ORAL
  Filled 2015-12-19 (×2): qty 1

## 2015-12-19 MED ORDER — ACETAMINOPHEN 325 MG PO TABS
650.0000 mg | ORAL_TABLET | Freq: Four times a day (QID) | ORAL | Status: DC | PRN
  Administered 2015-12-19 – 2015-12-21 (×3): 650 mg via ORAL
  Filled 2015-12-19 (×3): qty 2

## 2015-12-19 MED ORDER — AMLODIPINE BESYLATE 10 MG PO TABS
10.0000 mg | ORAL_TABLET | Freq: Every day | ORAL | Status: DC
  Administered 2015-12-19 – 2015-12-21 (×3): 10 mg via ORAL
  Filled 2015-12-19 (×3): qty 1

## 2015-12-19 NOTE — Progress Notes (Signed)
Report called to Sydell Axon, Therapist, sports for [employer].

## 2015-12-19 NOTE — Progress Notes (Signed)
4 Days Post-Op  Subjective: Abdominal pain improved from yesterday. Bowel movement last night. Passing flatus. WBC 13.7, trending up from 12.2 on 7/31. Afebrile. Intermittent tachypnea.   Objective: Vital signs in last 24 hours: Temp:  [98.4 F (36.9 C)-99.5 F (37.5 C)] 98.4 F (36.9 C) (08/01 0836) Pulse Rate:  [54-85] 63 (08/01 0836) Resp:  [13-25] 21 (08/01 0836) BP: (139-158)/(69-92) 139/73 (08/01 0836) SpO2:  [96 %-100 %] 100 % (08/01 0836) Last BM Date: 12/17/15  Intake/Output from previous day: 07/31 0701 - 08/01 0700 In: 460 [P.O.:360; IV Piggyback:100] Out: 984 [Urine:978; Stool:6] Intake/Output this shift: No intake/output data recorded.  General appearance  Supine. Awake and talkative.  Head: Normocephalic/atraumatic. PERRL bilaterally. Extraocular eye muscles in tact. NG tube removed   Neck: FROM Cardiovascular: Bradycardic with regular rhythm. No gallops, murmurs or rubs Respiratory: Lung sounds vesicular. No wheezes, crakles or rhonchi auscultated Abdomen: Soft. Still diffusely tender to deep palpation. Distended. No guarding. No rigidity. +BS.   Lab Results:   Recent Labs  12/18/15 0608 12/19/15 0428  WBC 12.2* 13.7*  HGB 8.9* 10.1*  HCT 27.9* 31.1*  PLT 239 290   BMET  Recent Labs  12/18/15 0608 12/19/15 0428  NA 136 136  K 3.4* 3.6  CL 102 103  CO2 26 24  GLUCOSE 126* 109*  BUN 7 <5*  CREATININE 1.14 1.27*  CALCIUM 8.3* 8.8*   PT/INR No results for input(s): LABPROT, INR in the last 72 hours. ABG No results for input(s): PHART, HCO3 in the last 72 hours.  Invalid input(s): PCO2, PO2  Studies/Results: Dg Abd Acute W/chest  Result Date: 12/18/2015 CLINICAL DATA:  Generalized abdominal pain, nausea, and constipation for 3 days, bowel obstruction, history hypertension, small-bowel obstruction, diverticulosis, rheumatoid arthritis EXAM: DG ABDOMEN ACUTE W/ 1V CHEST COMPARISON:  729 1,017 FINDINGS: Nasogastric tube extends into stomach.  Minimal enlargement of cardiac silhouette. Mediastinal contours and pulmonary vascularity normal. Lungs clear. No pleural effusion or pneumothorax. Gaseous distention of colon again identified. Some stool in ascending and descending colon. Scattered normal caliber small bowel loops. No definite free intraperitoneal air or bowel wall thickening. Degenerative disc disease changes lumbar spine. IMPRESSION: Persistent gaseous distention of colon without definite evidence of obstruction or perforation. Electronically Signed   By: Lavonia Dana M.D.   On: 12/18/2015 21:52    Anti-infectives: Anti-infectives    Start     Dose/Rate Route Frequency Ordered Stop   12/15/15 2100  piperacillin-tazobactam (ZOSYN) IVPB 3.375 g     3.375 g 12.5 mL/hr over 240 Minutes Intravenous Every 8 hours 12/15/15 1958        Assessment/Plan: Ischemic colitis with colonic ileus  Abdominal pain improved significantly from yesterday. Still concerned for diffuse tenderness on physical exam. Will continue to treat conservatively with IV antibiotics and bowel rest for now. Abdominal xray from yesterday indicated gaseous distension of colon without evidence of obstruction or perforation. Agree with interpretation. Removed NG tube at time of exam.   VTE: SCDs FEN: Clears  ID: Zosyn Day#4  Lannie Fields PASII 12/19/2015

## 2015-12-19 NOTE — Progress Notes (Signed)
Received report

## 2015-12-19 NOTE — Progress Notes (Signed)
NGT removed by PA student with standby assist.

## 2015-12-19 NOTE — Progress Notes (Addendum)
PROGRESS NOTE  Kevin Lopez  [id number] DOB: 1950-07-01  DOA: 12/15/2015 PCP: [name], MD   Brief Narrative:  65 year old male with PMH of HTN, rheumatoid arthritis (follows with rheumatology), multiple abdominal surgeries, prior episodes of SBO, initially presented to [facility] with an episode of colonic ileus. He was evaluated by GI (Dr. [name]) and general surgery who advised transfer to [facility] for further evaluation and management of ischemic colitis associated with colonic ileus. He was admitted to stepdown unit. General surgery was consulted and patient was treated conservatively with bowel rest, NG tube, IV fluids, serial exams and imaging. Clinically improving. NG tube removed 8/1. Transfer to medical bed 8/1.   Assessment & Plan:   Principal Problem:   Ischemic bowel disease ([place]) Active Problems:   Hypertension   SBO (small bowel obstruction) s/p ex lap in past   Leukocytosis   Cancer of left colon s/p colectomy/ostomy, and ostomy takedown   Ischemic colitis with colonic ileus - Initially presented to the [facility] and evaluated by GI and general surgery - Unsuccessful flexible sigmoidoscopy at [facility] >please see detailed report below. - Transferred to [facility] and admitted to stepdown unit. General surgery consultation & follow-up appreciated.  - Treated conservatively with bowel rest, NG tube, IV fluids, IV Zosyn and pain management. Mobilize as tolerated. Hopefully can resolve with conservative management otherwise will need surgery. - No mesenteric artery occlusion on CT abdomen. Lactate normal. - On 7/30: Some improvement. Had a BM, passing flatus, abdomen feels better. Surgery clamping NG tube. Mobilize. - On 7/31: Tolerating clear liquids, had a small BM 731 morning, abdominal distention slightly better but pain unchanged compared to the day prior.  - On 8/1: Gradually improving. Had 2 BMs yesterday. Abdominal distention  and pain are gradually decreasing. Tolerating clear liquids. General surgery is seen today and removing NG tube. Will transfer to floor 8/1. Diet per general surgery. Abdominal x-ray from last night shows persistent gaseous distention of colon without definitive evidence of obstruction or perforation.  Essential hypertension - While patient was nothing by mouth, he was placed on scheduled IV metoprolol and when necessary IV hydralazine with reasonable blood pressure control. Now that he is tolerating orally, DC IV metoprolol and resume home dose of amlodipine 10 MG daily. Hold ARB due to mild elevation of creatinine. If blood pressures remain elevated and creatinine has improved, consider resuming ARB on 8/2.  Rheumatoid arthritis/left knee & right foot acute arthritis - Sees outpatient rheumatology. Hold Abatacept due to ischemic colitis. - Complaints of left knee pain and indicated that his wife brought in and he self-administered Orencia injection in the hospital on 12/17/15. Patient was counseled that he should not take any medications without those being prescribed by the medical team in the hospital. He verbalized understanding. - Discussed with primary rheumatologist [name] on 7/31: Agrees with holding Abatacept due to concern for infection/ischemic colitis. Recommends PRN intra-articular steroids by orthopedics if worsens. She stated that when she saw him 2-3 weeks ago in the office, his rheumatoid arthritis was not controlled and they were checking with his insurance and contemplating changing regimen. He has to follow with her as outpatient post discharge. - 12/19/15: Apart from left knee pain and swelling, patient today also complains of pain and swelling midright foot. Called and discussed with [name], rheumatology: Since multiple sites involved by acute arthritic changes, recommended IV Solu-Medrol 125 MG times one dose today followed by prednisone 5 MG daily beginning 8/2.  She  advised checking uric acid levels and wishes to be updated with clinical status and labs on 8/2.  Anemia - Hemoglobin has dropped from 11-9. Likely from critical illness. No overt bleeding. Transfuse if hemoglobin < 8 g per DL. Hemoglobin stable over last 72 hours.  Acute kidney injury versus stage II chronic kidney disease - Recent baseline creatinine not known. Presented with creatinine of 1.26 and about the same today. Gentle IV fluids until improved and consistent oral intake.  Hypokalemia - Replaced.  DVT prophylaxis: SCDs. Code Status: Full Family Communication: Discussed with patient. No family at bedside. Disposition Plan: Transferred from [facility] and admitted to [facility] stepdown on 12/15/15. Transfer to medical floor on 8/1. DC home when clinically improved.  Consultants:   General surgery  Procedures:   NG tube-discontinued 8/1  Antimicrobials:   IV Zosyn 7/28 >    Subjective: Tolerating clear liquids, had 2 loose BM's on 7/31, abdominal distention and pain have improved. Complaints of pain and swelling of left knee with difficulty to weight-bear and right foot. Indicates that these joint pain started since admission.  Objective:  Vitals:   12/18/15 2333 12/19/15 0437 12/19/15 0836 12/19/15 1100  BP: (!) 141/69 (!) 142/76 139/73 (!) 155/77  Pulse: 71 (!) 59 63 60  Resp: 20 14 (!) 21 15  Temp: 99.5 F (37.5 C) 98.7 F (37.1 C) 98.4 F (36.9 C) 98.2 F (36.8 C)  TempSrc: Oral Oral Oral Oral  SpO2: 100% 97% 100% 98%  Weight:      Height:        Intake/Output Summary (Last 24 hours) at 12/19/15 1254 Last data filed at 12/19/15 0557  Gross per 24 hour  Intake              460 ml  Output              607 ml  Net             -147 ml   Filed Weights   12/15/15 0943 12/15/15 1821 12/15/15 2255  Weight: 78.9 kg (174 lb) 75.8 kg (167 lb 1.7 oz) 76.9 kg (169 lb 8.5 oz)    Examination:  General exam: Pleasant middle-aged male lying on comfortably  propped up in bed. Looks improved compared to yesterday. Respiratory system: Clear to auscultation. Respiratory effort normal. Cardiovascular system: S1 & S2 heard, RRR. No JVD, murmurs, rubs, gallops or clicks. No pedal edema. Telemetry: Sinus rhythm. Occasional sinus bradycardia in the 50s. Gastrointestinal system: Abdomen is less/mild distended, still diffusely tender with guarding but improved compared to yesterday and good bowel sounds heard. No organomegaly or masses felt. Extensive laparotomy scar. Central nervous system: Alert and oriented. No focal neurological deficits. Extremities: Symmetric 5 x 5 power. Left knee with mild swelling, warmth and moderate tenderness & painful ROM. Dorsal right mid foot with mild swelling warmth and tenderness. Skin: No rashes, lesions or ulcers Psychiatry: Judgement and insight appear normal. Mood & affect appropriate.     Data Reviewed: I have personally reviewed following labs and imaging studies  CBC:  Recent Labs Lab 12/15/15 0947 12/16/15 0947 12/17/15 0305 12/18/15 0608 12/19/15 0428  WBC 19.4* 21.4* 17.2* 12.2* 13.7*  HGB 11.9* 11.3* 9.1* 8.9* 10.1*  HCT 36.0* 35.6* 29.3* 27.9* 31.1*  MCV 81.3 83.8 84.2 82.8 82.1  PLT 311 281 244 239 [id number]   Basic Metabolic Panel:  Recent Labs Lab 12/15/15 0947 12/16/15 0947 12/17/15 0305 12/18/15 0608 12/19/15 0428  NA 139  137 137 136 136  K 3.5 3.9 3.7 3.4* 3.6  CL 103 107 104 102 103  CO2 27 21* 21* 26 24  GLUCOSE 142* 83 66 126* 109*  BUN 16 12 15 7  <5*  CREATININE 1.26* 1.36* 1.41* 1.14 1.27*  CALCIUM 9.4 8.6* 8.5* 8.3* 8.8*   GFR: Estimated Creatinine Clearance: 59.5 mL/min (by C-G formula based on SCr of 1.27 mg/dL). Liver Function Tests:  Recent Labs Lab 12/15/15 0947  AST 19  ALT 19  ALKPHOS 43  BILITOT 1.1  PROT 7.9  ALBUMIN 4.5    Recent Labs Lab 12/15/15 0947  LIPASE 32   No results for input(s): AMMONIA in the last 168 hours. Coagulation Profile: No  results for input(s): INR, PROTIME in the last 168 hours. Cardiac Enzymes: No results for input(s): CKTOTAL, CKMB, CKMBINDEX, TROPONINI in the last 168 hours. BNP (last 3 results) No results for input(s): PROBNP in the last 8760 hours. HbA1C: No results for input(s): HGBA1C in the last 72 hours. CBG:  Recent Labs Lab 12/18/15 1200 12/18/15 1743 12/18/15 2332 12/19/15 0501 12/19/15 1205  GLUCAP 130* 116* 110* 115* 110*   Lipid Profile: No results for input(s): CHOL, HDL, LDLCALC, TRIG, CHOLHDL, LDLDIRECT in the last 72 hours. Thyroid Function Tests: No results for input(s): TSH, T4TOTAL, FREET4, T3FREE, THYROIDAB in the last 72 hours. Anemia Panel: No results for input(s): VITAMINB12, FOLATE, FERRITIN, TIBC, IRON, RETICCTPCT in the last 72 hours.  Sepsis Labs:  Recent Labs Lab 12/15/15 1414 12/15/15 1644  LATICACIDVEN 1.3 1.2    Recent Results (from the past 240 hour(s))  MRSA PCR Screening     Status: None   Collection Time: 12/18/15 11:30 AM  Result Value Ref Range Status   MRSA by PCR NEGATIVE NEGATIVE Final    Comment:        The GeneXpert MRSA Assay (FDA approved for NASAL specimens only), is one component of a comprehensive MRSA colonization surveillance program. It is not intended to diagnose MRSA infection nor to guide or monitor treatment for MRSA infections.          Radiology Studies: Dg Abd Acute W/chest  Result Date: 12/18/2015 CLINICAL DATA:  Generalized abdominal pain, nausea, and constipation for 3 days, bowel obstruction, history hypertension, small-bowel obstruction, diverticulosis, rheumatoid arthritis EXAM: DG ABDOMEN ACUTE W/ 1V CHEST COMPARISON:  729 1,017 FINDINGS: Nasogastric tube extends into stomach. Minimal enlargement of cardiac silhouette. Mediastinal contours and pulmonary vascularity normal. Lungs clear. No pleural effusion or pneumothorax. Gaseous distention of colon again identified. Some stool in ascending and descending  colon. Scattered normal caliber small bowel loops. No definite free intraperitoneal air or bowel wall thickening. Degenerative disc disease changes lumbar spine. IMPRESSION: Persistent gaseous distention of colon without definite evidence of obstruction or perforation. Electronically Signed   By: Lavonia Dana M.D.   On: 12/18/2015 21:52        Scheduled Meds: . amLODipine  10 mg Oral Daily  . bisacodyl  10 mg Rectal Daily  . methylPREDNISolone (SOLU-MEDROL) injection  125 mg Intravenous Once  . piperacillin-tazobactam  3.375 g Intravenous Q8H  . [START ON 12/20/2015] predniSONE  5 mg Oral Q breakfast   Continuous Infusions: . sodium chloride       LOS: 4 days    Time spent: 25 minutes.    [facility], MD [facility] Pager [phone]  If 7PM-7AM, please contact night-coverage [email or web address] Password [email or web address] 12/19/2015, 12:54 PM

## 2015-12-19 NOTE — Progress Notes (Signed)
Patrient c/o being hot then cold.  VS obtained.  Patient's sheets are wet where he has been lying on the bed.  Dr. Quincy Simmonds notified via text message.

## 2015-12-19 NOTE — Progress Notes (Signed)
Dr. Kizzie Fantasia wants patient to get a dose of tylenol and notify him if his temp goes above 101.5

## 2015-12-20 ENCOUNTER — Encounter (HOSPITAL_COMMUNITY): Payer: Self-pay | Admitting: Gastroenterology

## 2015-12-20 DIAGNOSIS — K559 Vascular disorder of intestine, unspecified: Principal | ICD-10-CM

## 2015-12-20 DIAGNOSIS — I10 Essential (primary) hypertension: Secondary | ICD-10-CM

## 2015-12-20 DIAGNOSIS — K5669 Other intestinal obstruction: Secondary | ICD-10-CM

## 2015-12-20 LAB — CBC
HEMATOCRIT: 28.7 % — AB (ref 39.0–52.0)
Hemoglobin: 9.3 g/dL — ABNORMAL LOW (ref 13.0–17.0)
MCH: 26 pg (ref 26.0–34.0)
MCHC: 32.4 g/dL (ref 30.0–36.0)
MCV: 80.2 fL (ref 78.0–100.0)
PLATELETS: 267 10*3/uL (ref 150–400)
RBC: 3.58 MIL/uL — AB (ref 4.22–5.81)
RDW: 13.8 % (ref 11.5–15.5)
WBC: 8.5 10*3/uL (ref 4.0–10.5)

## 2015-12-20 LAB — BASIC METABOLIC PANEL
Anion gap: 10 (ref 5–15)
BUN: 6 mg/dL (ref 6–20)
CHLORIDE: 101 mmol/L (ref 101–111)
CO2: 24 mmol/L (ref 22–32)
Calcium: 8.8 mg/dL — ABNORMAL LOW (ref 8.9–10.3)
Creatinine, Ser: 1.22 mg/dL (ref 0.61–1.24)
GFR calc Af Amer: >60 mL/min (ref >60)
GLUCOSE: 175 mg/dL — AB (ref 65–99)
POTASSIUM: 4.2 mmol/L (ref 3.5–5.1)
Sodium: 135 mmol/L (ref 135–145)

## 2015-12-20 NOTE — Evaluation (Signed)
Physical Therapy Evaluation Patient Details Name: Kevin Lopez MRN: [id number] DOB: 21-Sep-1950 Today's Date: 12/20/2015   History of Present Illness  Pt is a very pleasant 65 y/o male s/p abdominal surgery for distended colon on 7/28. PMH including but not limited to diverticulosis, HTN and RA.  Clinical Impression  Pt presented L sidelying in bed, awake and willing to participate in therapy session. Pt requested to use bedside commode at beginning of session. He was able to perform STP transfer with min guard for safety. He also ambulated from his bed to the recliner in his room with min guard for safety. At end of session, pt's lunch tray arrived and pt stated that he was very hungry as he has not eaten a meal in over a week. Therapist left pt sitting in recliner to eat his lunch and notified his nurse nurse at the end of the session.  Pt would continue to benefit from skilled physical therapy services at this time while admitted to address his below listed limitations in order to improve his overall safety and independence with functional mobility.      Follow Up Recommendations Supervision for mobility/OOB    Equipment Recommendations  None recommended by PT;Other (comment) (pt reported having RW at home)    Recommendations for Other Services       Precautions / Restrictions Precautions Precautions: Fall Restrictions Weight Bearing Restrictions: No      Mobility  Bed Mobility Overal bed mobility: Needs Assistance Bed Mobility: Supine to Sit     Supine to sit: Supervision     General bed mobility comments: pt required increased time to complete  Transfers Overall transfer level: Needs assistance Equipment used: Rolling walker (2 wheeled) Transfers: Sit to/from [facility] Sit to Stand: Min guard Stand pivot transfers: Min guard       General transfer comment: pt required increased time  Ambulation/Gait Ambulation/Gait assistance: Min  guard Ambulation Distance (Feet): 25 Feet Assistive device: Rolling walker (2 wheeled) Gait Pattern/deviations: Step-through pattern;Antalgic;Trunk flexed Gait velocity: decreased Gait velocity interpretation: Below normal speed for age/gender    Stairs            Wheelchair Mobility    Modified Rankin (Stroke Patients Only)       Balance Overall balance assessment: Needs assistance Sitting-balance support: Feet supported;No upper extremity supported Sitting balance-Leahy Scale: Fair     Standing balance support: Single extremity supported;During functional activity Standing balance-Leahy Scale: Poor                               Pertinent Vitals/Pain Pain Assessment: Faces Faces Pain Scale: Hurts a little bit Pain Location: abdomen and L knee secondary to RA  Pain Descriptors / Indicators: Discomfort;Guarding Pain Intervention(s): Monitored during session;Repositioned    Home Living Family/patient expects to be discharged to:: Private residence Living Arrangements: Children Available Help at Discharge: Family;Available 24 hours/day Type of Home: House Home Access: Stairs to enter Entrance Stairs-Rails: Can reach both Entrance Stairs-Number of Steps: 3 Home Layout: Multi-level;Able to live on main level with bedroom/bathroom Home Equipment: Gilford Rile - 2 wheels      Prior Function Level of Independence: Independent               Hand Dominance        Extremity/Trunk Assessment   Upper Extremity Assessment: Overall WFL for tasks assessed           Lower Extremity Assessment: Overall  WFL for tasks assessed         Communication   Communication: No difficulties  Cognition Arousal/Alertness: Awake/alert Behavior During Therapy: WFL for tasks assessed/performed Overall Cognitive Status: Within Functional Limits for tasks assessed                      General Comments      Exercises        Assessment/Plan     PT Assessment Patient needs continued PT services  PT Diagnosis Difficulty walking   PT Problem List Decreased strength;Decreased activity tolerance;Decreased balance;Decreased mobility;Pain  PT Treatment Interventions DME instruction;Gait training;Stair training;Functional mobility training;Therapeutic activities;Therapeutic exercise;Balance training;Patient/family education   PT Goals (Current goals can be found in the Care Plan section) Acute Rehab PT Goals Patient Stated Goal: return home PT Goal Formulation: With patient Time For Goal Achievement: 12/27/15 Potential to Achieve Goals: Good    Frequency Min 3X/week   Barriers to discharge        Co-evaluation               End of Session Equipment Utilized During Treatment: Gait belt Activity Tolerance: Patient limited by pain Patient left: in chair;with call bell/phone within reach Nurse Communication: Mobility status         Time: [id number] PT Time Calculation (min) (ACUTE ONLY): 12 min   Charges:   PT Evaluation $PT Eval Moderate Complexity: 1 Procedure     PT G CodesClearnce Sorrel Wasyl Dornfeld 12/20/2015, 1:04 PM Sherie Don, [place], DPT [phone]

## 2015-12-20 NOTE — Progress Notes (Signed)
PROGRESS NOTE  Kevin Lopez  [id number] DOB: [birth year]  DOA: 12/15/2015 PCP: [name], MD   Brief Narrative:  65 year old male with PMH of HTN, rheumatoid arthritis (follows with rheumatology), multiple abdominal surgeries, prior episodes of SBO, initially presented to [facility] with an episode of colonic ileus. He was evaluated by GI (Dr. [name]) and general surgery who advised transfer to [facility] for further evaluation and management of ischemic colitis associated with colonic ileus. He was admitted to stepdown unit. General surgery was consulted and patient was treated conservatively with bowel rest, NG tube, IV fluids, serial exams and imaging. Clinically improving. NG tube removed 8/1. Transfer to medical bed 8/1.   Assessment & Plan:   Principal Problem:   Ischemic bowel disease ([name]) Active Problems:   Hypertension   SBO (small bowel obstruction) s/p ex lap in past   Leukocytosis   Cancer of left colon s/p colectomy/ostomy, and ostomy takedown   Ischemic colitis with colonic ileus - Initially presented to the [facility] and evaluated by GI and general surgery - Unsuccessful flexible sigmoidoscopy at [facility]  - Transferred to [facility] and admitted to stepdown unit. General surgery consultation & follow-up appreciated.  - Treated conservatively with bowel rest, NG tube, IV fluids, IV Zosyn and pain management. Mobilize as tolerated. Hopefully can resolve with conservative management otherwise will need surgery. - No mesenteric artery occlusion on CT abdomen. Lactate normal. - On 7/30:  Had a BM, flatus+,  Surgery clamped NG tube. - On 7/31: clear liquid started, BM +, mobilize.  - On 8/1: continues to have BM +, Advance diet as per surgery. NG tube taken out and transferred to floor. Abdominal x-ray from last night shows persistent gaseous distention of colon without definitive evidence of obstruction or perforation. - 8/2 advanced  diet to full liquids. BM +, further recommendations as per surgery. ambulate  Essential hypertension Much better controlled today. Resume Norvasc, holding telmisartan for renal issues.  Can resume ARB on discharge.    Rheumatoid arthritis/left knee & right foot acute arthritis - Sees outpatient rheumatology. Hold Abatacept due to ischemic colitis. - Complaints of left knee pain and indicated that his wife brought in and he self-administered Orencia injection in the hospital on 12/17/15. Patient was counseled that he should not take any medications without those being prescribed by the medical team in the hospital. He verbalized understanding. - Discussed with primary rheumatologist [name] on 7/31: Agrees with holding Abatacept due to concern for infection/ischemic colitis. Recommends PRN intra-articular steroids by orthopedics if worsens. She stated that when she saw him 2-3 weeks ago in the office, his rheumatoid arthritis was not controlled and they were checking with his insurance and contemplating changing regimen. He has to follow with her as outpatient post discharge. - 12/19/15: Apart from left knee pain and swelling, patient today also complains of pain and swelling midright foot. Called and discussed with [name], rheumatology: Since multiple sites involved by acute arthritic changes, recommended IV Solu-Medrol 125 MG times one dose today followed by prednisone 5 MG daily beginning 8/2. Recommended checking uric acid levels , which were 4.4.  Will call Dr Dossie Der and update her about the uric acid .   Anemia - Hemoglobin has dropped from 11-9. Likely from critical illness. No overt bleeding. Transfuse if hemoglobin < 8 g per DL. Hemoglobin stable over last 72 hours. - normocytic.   Acute kidney injury versus stage II chronic kidney disease - Recent baseline creatinine not  known. Presented with creatinine of 1.26 and repeat level is 1.22. Suspect this is his baseline.    Hypokalemia - Replaced.  DVT prophylaxis: SCDs. Code Status: Full Family Communication: Discussed with patient. No family at bedside. Will talk to daughters when they visit the patient.  Disposition Plan: Home when able to tolerate soft diet and when knee pain improves.   Consultants:   General surgery  Procedures:   NG tube-discontinued 8/1  Antimicrobials:   IV Zosyn 7/28 >    Subjective: Tolerating liquid diet, advanced to full liquid. One BM last night, no nausea or vomiting. Passing flatulence.  Bilateral knee pain, painful ROM. Glad to have the prednisone re started.   Objective:  Vitals:   12/19/15 1530 12/19/15 2113 12/20/15 0550 12/20/15 0843  BP: (!) 143/74 (!) 148/79 (!) 142/78 129/75  Pulse: 63 66 (!) 53 62  Resp: 18 18 19    Temp: 98.5 F (36.9 C) 97.6 F (36.4 C) 98 F (36.7 C)   TempSrc: Oral Oral Oral   SpO2: 100% 98% 99%   Weight:      Height:        Intake/Output Summary (Last 24 hours) at 12/20/15 0959 Last data filed at 12/19/15 1900  Gross per 24 hour  Intake              960 ml  Output              350 ml  Net              610 ml   Filed Weights   12/15/15 0943 12/15/15 1821 12/15/15 2255  Weight: 78.9 kg (174 lb) 75.8 kg (167 lb 1.7 oz) 76.9 kg (169 lb 8.5 oz)    Examination:  General exam: Pleasant middle-aged male lying in bed appears comfortable.  Respiratory system: Clear to auscultation. Respiratory effort normal. No wheezing or rhonchi. Cardiovascular system: S1 & S2 heard, RRR. No JVD, murmurs, rubs, gallops or clicks. No pedal edema.  Gastrointestinal system: Abdomen is mildly distended, with mild generalized tenderness, bowel sounds are present. No masses felt.  Extensive laparotomy scar. Central nervous system: Alert and oriented. No focal neurological deficits. Extremities: bilateral knee swelling with warmth and tenderness on ROM. Bilateral ankle tenderness and swelling.  Skin: No rashes, lesions or  ulcers Psychiatry: anxious, about his recurrently SBO episodes.    Data Reviewed: I have personally reviewed following labs and imaging studies  CBC:  Recent Labs Lab 12/16/15 0947 12/17/15 0305 12/18/15 0608 12/19/15 0428 12/20/15 0407  WBC 21.4* 17.2* 12.2* 13.7* 8.5  HGB 11.3* 9.1* 8.9* 10.1* 9.3*  HCT 35.6* 29.3* 27.9* 31.1* 28.7*  MCV 83.8 84.2 82.8 82.1 80.2  PLT 281 244 239 290 [id number]   Basic Metabolic Panel:  Recent Labs Lab 12/16/15 0947 12/17/15 0305 12/18/15 0608 12/19/15 0428 12/20/15 0407  NA 137 137 136 136 135  K 3.9 3.7 3.4* 3.6 4.2  CL 107 104 102 103 101  CO2 21* 21* 26 24 24   GLUCOSE 83 66 126* 109* 175*  BUN 12 15 7  <5* 6  CREATININE 1.36* 1.41* 1.14 1.27* 1.22  CALCIUM 8.6* 8.5* 8.3* 8.8* 8.8*   GFR: Estimated Creatinine Clearance: 62 mL/min (by C-G formula based on SCr of 1.22 mg/dL). Liver Function Tests:  Recent Labs Lab 12/15/15 0947  AST 19  ALT 19  ALKPHOS 43  BILITOT 1.1  PROT 7.9  ALBUMIN 4.5    Recent Labs Lab 12/15/15 0947  LIPASE 32  No results for input(s): AMMONIA in the last 168 hours. Coagulation Profile: No results for input(s): INR, PROTIME in the last 168 hours. Cardiac Enzymes: No results for input(s): CKTOTAL, CKMB, CKMBINDEX, TROPONINI in the last 168 hours. BNP (last 3 results) No results for input(s): PROBNP in the last 8760 hours. HbA1C: No results for input(s): HGBA1C in the last 72 hours. CBG:  Recent Labs Lab 12/18/15 1200 12/18/15 1743 12/18/15 2332 12/19/15 0501 12/19/15 1205  GLUCAP 130* 116* 110* 115* 110*   Lipid Profile: No results for input(s): CHOL, HDL, LDLCALC, TRIG, CHOLHDL, LDLDIRECT in the last 72 hours. Thyroid Function Tests: No results for input(s): TSH, T4TOTAL, FREET4, T3FREE, THYROIDAB in the last 72 hours. Anemia Panel: No results for input(s): VITAMINB12, FOLATE, FERRITIN, TIBC, IRON, RETICCTPCT in the last 72 hours.  Sepsis Labs:  Recent Labs Lab 12/15/15 1414  12/15/15 1644  LATICACIDVEN 1.3 1.2    Recent Results (from the past 240 hour(s))  MRSA PCR Screening     Status: None   Collection Time: 12/18/15 11:30 AM  Result Value Ref Range Status   MRSA by PCR NEGATIVE NEGATIVE Final    Comment:        The GeneXpert MRSA Assay (FDA approved for NASAL specimens only), is one component of a comprehensive MRSA colonization surveillance program. It is not intended to diagnose MRSA infection nor to guide or monitor treatment for MRSA infections.          Radiology Studies: Dg Abd Acute W/chest  Result Date: 12/18/2015 CLINICAL DATA:  Generalized abdominal pain, nausea, and constipation for 3 days, bowel obstruction, history hypertension, small-bowel obstruction, diverticulosis, rheumatoid arthritis EXAM: DG ABDOMEN ACUTE W/ 1V CHEST COMPARISON:  729 1,017 FINDINGS: Nasogastric tube extends into stomach. Minimal enlargement of cardiac silhouette. Mediastinal contours and pulmonary vascularity normal. Lungs clear. No pleural effusion or pneumothorax. Gaseous distention of colon again identified. Some stool in ascending and descending colon. Scattered normal caliber small bowel loops. No definite free intraperitoneal air or bowel wall thickening. Degenerative disc disease changes lumbar spine. IMPRESSION: Persistent gaseous distention of colon without definite evidence of obstruction or perforation. Electronically Signed   By: Lavonia Dana M.D.   On: 12/18/2015 21:52        Scheduled Meds: . amLODipine  10 mg Oral Daily  . bisacodyl  10 mg Rectal Daily  . piperacillin-tazobactam  3.375 g Intravenous Q8H  . predniSONE  5 mg Oral Q breakfast   Continuous Infusions:     LOS: 5 days    Time spent: 25 minutes.    Hosie Poisson, MD [facility] Pager [phone] If 7PM-7AM, please contact night-coverage [email or web address] Password [email or web address] 12/20/2015, 9:59 AM

## 2015-12-20 NOTE — Progress Notes (Signed)
5 Days Post-Op  Subjective: Abdominal pain continues to improve. Still quite distended. Passing flatus and having BMs. Denies nausea and vomiting. WBC 8.5 from 13.7 on 12/19/15.  Objective: Vital signs in last 24 hours: Temp:  [97.6 F (36.4 C)-99.2 F (37.3 C)] 98 F (36.7 C) (08/02 0550) Pulse Rate:  [53-68] 62 (08/02 0843) Resp:  [15-19] 19 (08/02 0550) BP: (129-160)/(74-84) 129/75 (08/02 0843) SpO2:  [98 %-100 %] 99 % (08/02 0550) Last BM Date: 12/19/15  Intake/Output from previous day: 08/01 0701 - 08/02 0700 In: 960 [P.O.:960] Out: 350 [Urine:350] Intake/Output this shift: No intake/output data recorded.  General appearance Supine.Awake and talkative.  Head: Normocephalic/atraumatic. PERRL bilaterally. Extraocular eye muscles in tact.   Neck: FROM Cardiovascular: Regular rate and rhythm. No gallops,murmurs or rubs Respiratory: Lung sounds vesicular. No wheezes, craklesor rhonchi auscultated Abdomen: Soft. Mildly tender to palpation, tenderness much better than yesterday. Distended. No guarding. No rigidity. +BS.  Lab Results:   Recent Labs  12/19/15 0428 12/20/15 0407  WBC 13.7* 8.5  HGB 10.1* 9.3*  HCT 31.1* 28.7*  PLT 290 267   BMET  Recent Labs  12/19/15 0428 12/20/15 0407  NA 136 135  K 3.6 4.2  CL 103 101  CO2 24 24  GLUCOSE 109* 175*  BUN <5* 6  CREATININE 1.27* 1.22  CALCIUM 8.8* 8.8*   PT/INR No results for input(s): LABPROT, INR in the last 72 hours. ABG No results for input(s): PHART, HCO3 in the last 72 hours.  Invalid input(s): PCO2, PO2  Studies/Results: Dg Abd Acute W/chest  Result Date: 12/18/2015 CLINICAL DATA:  Generalized abdominal pain, nausea, and constipation for 3 days, bowel obstruction, history hypertension, small-bowel obstruction, diverticulosis, rheumatoid arthritis EXAM: DG ABDOMEN ACUTE W/ 1V CHEST COMPARISON:  729 1,017 FINDINGS: Nasogastric tube extends into stomach. Minimal enlargement of cardiac silhouette.  Mediastinal contours and pulmonary vascularity normal. Lungs clear. No pleural effusion or pneumothorax. Gaseous distention of colon again identified. Some stool in ascending and descending colon. Scattered normal caliber small bowel loops. No definite free intraperitoneal air or bowel wall thickening. Degenerative disc disease changes lumbar spine. IMPRESSION: Persistent gaseous distention of colon without definite evidence of obstruction or perforation. Electronically Signed   By: Lavonia Dana M.D.   On: 12/18/2015 21:52    Anti-infectives: Anti-infectives    Start     Dose/Rate Route Frequency Ordered Stop   12/15/15 2100  piperacillin-tazobactam (ZOSYN) IVPB 3.375 g     3.375 g 12.5 mL/hr over 240 Minutes Intravenous Every 8 hours 12/15/15 1958        Assessment/Plan: Ischemic colitis with colonic ileus  Abdominal pain improved significantly. Advanced to soft diet. Encouraged ambulation. WBC trending down. If trend continues, will transition to PO antibiotics tomorrow.   VTE: SCDs FEN: Soft diet  ID: Zosyn Day#5 Dispo: Toleration of advanced diet, home possibly tomorrow   Lannie Fields PASII 12/20/2015

## 2015-12-21 LAB — CBC
HEMATOCRIT: 27.8 % — AB (ref 39.0–52.0)
Hemoglobin: 9 g/dL — ABNORMAL LOW (ref 13.0–17.0)
MCH: 26.2 pg (ref 26.0–34.0)
MCHC: 32.4 g/dL (ref 30.0–36.0)
MCV: 80.8 fL (ref 78.0–100.0)
Platelets: 287 10*3/uL (ref 150–400)
RBC: 3.44 MIL/uL — ABNORMAL LOW (ref 4.22–5.81)
RDW: 14.1 % (ref 11.5–15.5)
WBC: 12.8 10*3/uL — AB (ref 4.0–10.5)

## 2015-12-21 LAB — BASIC METABOLIC PANEL
ANION GAP: 7 (ref 5–15)
BUN: 10 mg/dL (ref 6–20)
CALCIUM: 8.6 mg/dL — AB (ref 8.9–10.3)
CO2: 26 mmol/L (ref 22–32)
Chloride: 104 mmol/L (ref 101–111)
Creatinine, Ser: 1.27 mg/dL — ABNORMAL HIGH (ref 0.61–1.24)
GFR calc Af Amer: >60 mL/min (ref >60)
GFR calc non Af Amer: 58 mL/min — ABNORMAL LOW (ref >60)
GLUCOSE: 166 mg/dL — AB (ref 65–99)
POTASSIUM: 4.4 mmol/L (ref 3.5–5.1)
Sodium: 137 mmol/L (ref 135–145)

## 2015-12-21 MED ORDER — ABATACEPT 125 MG/ML ~~LOC~~ SOSY: 125.0000 mg | PREFILLED_SYRINGE | SUBCUTANEOUS | Status: DC

## 2015-12-21 MED ORDER — PREDNISONE 5 MG PO TABS: 5.0000 mg | ORAL_TABLET | Freq: Every day | ORAL | 0 refills | Status: DC

## 2015-12-21 MED ORDER — AMOXICILLIN-POT CLAVULANATE 875-125 MG PO TABS: 1.0000 | ORAL_TABLET | Freq: Two times a day (BID) | ORAL | 0 refills | Status: DC

## 2015-12-21 NOTE — Progress Notes (Signed)
Patient ID: Kevin Lopez, male   DOB: [birth year], 65 y.o.   MRN: [id number]   LOS: 6 days   Subjective: Tolerating diet, denies N/V, pain controlled, +BM/flatus   Objective: Vital signs in last 24 hours: Temp:  [97.7 F (36.5 C)-98.6 F (37 C)] 98.5 F (36.9 C) (08/03 0627) Pulse Rate:  [54-68] 62 (08/03 0855) Resp:  [18] 18 (08/03 0627) BP: (131-151)/(66-73) 151/73 (08/03 0855) SpO2:  [99 %-100 %] 99 % (08/03 0627) Last BM Date: 12/20/15   Laboratory  CBC  Recent Labs  12/20/15 0407 12/21/15 0301  WBC 8.5 12.8*  HGB 9.3* 9.0*  HCT 28.7* 27.8*  PLT 267 287   BMET  Recent Labs  12/20/15 0407 12/21/15 0301  NA 135 137  K 4.2 4.4  CL 101 104  CO2 24 26  GLUCOSE 175* 166*  BUN 6 10  CREATININE 1.22 1.27*  CALCIUM 8.8* 8.6*    Physical Exam General appearance: alert and no distress Resp: clear to auscultation bilaterally Cardio: regular rate and rhythm GI: Soft, mild-to-mod TTP, mild distension, +BS   Assessment/Plan: Ischemic colitis with colonic ileus -- Symptoms improved over yesterday but WBC elevated for some reason. Given clinical improved would favor transition to PO abx but will defer to MD VTE: SCDs FEN: Soft diet  ID: Zosyn Day#7 Dispo: Will discuss plan with MD    Lisette Abu, PA-C Pager: [phone] 12/21/2015

## 2015-12-21 NOTE — Care Management Important Message (Signed)
Important Message  Patient Details  Name: Kevin Lopez MRN: [id number] Date of Birth: June 20, 1950   Medicare Important Message Given:  Yes    Loann Quill 12/21/2015, 11:03 AM

## 2015-12-21 NOTE — Discharge Summary (Signed)
Physician Discharge Summary  Kevin Lopez [id number] DOB: November 14, 1950 DOA: 12/15/2015  PCP: Kevin Seashore, MD  Admit date: 12/15/2015 Discharge date: 12/21/2015  Admitted From: [facility]. Disposition:  [facility].  Recommendations for Outpatient Follow-up:  1. Follow up with PCP in 1-2 weeks 2. Please obtain BMP/CBC in one week 3. Please follow up with rheumatologist in one week.     Discharge Condition:stable.  CODE STATUS:full code  Diet recommendation: Heart Healthy   Brief/Interim Summary: 65 year old male with PMH of HTN, rheumatoid arthritis (follows with rheumatology), multiple abdominal surgeries, prior episodes of SBO, initially presented to [facility] with an episode of colonic ileus. He was evaluated by GI (Kevin. Barney Lopez) and general surgery who advised transfer to [facility] for further evaluation and management of ischemic colitis associated with colonic ileus. He was admitted to stepdown unit. General surgery was consulted and patient was treated conservatively with bowel rest, NG tube, IV fluids, serial exams and imaging. Clinically improving. NG tube removed 8/1. Transfer to medical bed 8/1. He was started on diet and discharged on 8/3.  Discharge Diagnoses:  Principal Problem:   Ischemic bowel disease ([place]) Active Problems:   Hypertension   SBO (small bowel obstruction) s/p ex lap in past   Leukocytosis   Cancer of left colon s/p colectomy/ostomy, and ostomy takedown   Ischemic colitis with colonic ileus - Initially presented to the [facility] and evaluated by GI and general surgery - Unsuccessful flexible sigmoidoscopy at [facility]  - Transferred to [facility] and admitted to stepdown unit. General surgery consultation & follow-up appreciated.  - Treated conservatively with bowel rest, NG tube, IV fluids, IV Zosyn and pain management. Mobilize as tolerated. Hopefully can resolve with conservative management otherwise will need surgery. - No  mesenteric artery occlusion on CT abdomen. Lactate normal. - On 7/30:  Had a BM, flatus+,  Surgery clamped NG tube. - On 7/31: clear liquid started, BM +, mobilize.  - On 8/1: continues to have BM +, Advance diet as per surgery. NG tube taken out and transferred to floor. Abdominal x-ray from last night shows persistent gaseous distention of colon without definitive evidence of obstruction or perforation. - 8/2 advanced diet to full liquids. BM +,8/3 on soft diet and able to tolerate without any issues.  - for discharge.   Essential hypertension Well controlled.    Rheumatoid arthritis/left knee & right foot acute arthritis - Sees outpatient rheumatology. Hold Abatacept due to ischemic colitis. - Complaints of left knee pain and indicated that his wife brought in and he self-administered Orencia injection in the hospital on 12/17/15. Patient was counseled that he should not take any medications without those being prescribed by the medical team in the hospital. He verbalized understanding. - Discussed with primary rheumatologist Kevin Lopez on 7/31: Agrees with holding Abatacept due to concern for infection/ischemic colitis. Recommends PRN intra-articular steroids by orthopedics if worsens. She stated that when she saw him 2-3 weeks ago in the office, his rheumatoid arthritis was not controlled and they were checking with his insurance and contemplating changing regimen. He has to follow with her as outpatient post discharge. - 12/19/15: Apart from left knee pain and swelling, patient today also complains of pain and swelling midright foot. Called and discussed with Kevin Lopez, rheumatology: Since multiple sites involved by acute arthritic changes, recommended IV Solu-Medrol 125 MG times one dose today followed by prednisone 5 MG daily beginning 8/2. Recommended checking uric acid levels , which were 4.4.  Called Kevin Lopez and updated her.  Discharged him on oral prednisone for one week till  the paient sees his rheumatologist.  Anemia - Hemoglobin has dropped from 11-9. Likely from critical illness. No overt bleeding. Transfuse if hemoglobin < 8 g per DL. Hemoglobin stable over last 72 hours. - normocytic.   Acute kidney injury versus stage II chronic kidney disease Back to baseline.   Hypokalemia - Replaced.  Discharge Instructions  Discharge Instructions    Call MD for:  severe uncontrolled pain    Complete by:  As directed   Diet - low sodium heart healthy    Complete by:  As directed   Discharge instructions    Complete by:  As directed   Please follow up with PCP in one week.  Please follow up with your rheumatologist in one week before the prednisone runs out.  We have held your abatacept for the ongoing infection. Please follow up with rheumatology before resuming it.  We have held your micardis for your elevated creatinine, please follow upwith your PCP before resuming it.  Please check your cbc and BMP in one week at your PCP office.       Medication List    STOP taking these medications   ALEVE PM 220-25 MG Tabs Generic drug:  Naproxen Sod-Diphenhydramine   telmisartan 80 MG tablet Commonly known as:  MICARDIS     TAKE these medications   Abatacept 125 MG/ML Sosy Commonly known as:  ORENCIA Inject 125 mg into the skin once a week. On Thursday.   adapalene 0.1 % cream Commonly known as:  DIFFERIN Apply 1 application topically daily as needed.   albuterol 108 (90 Base) MCG/ACT inhaler Commonly known as:  PROVENTIL HFA;VENTOLIN HFA Inhale 2 puffs into the lungs every 6 (six) hours as needed for wheezing or shortness of breath.   amLODipine 10 MG tablet Commonly known as:  NORVASC Take 10 mg by mouth daily.   amoxicillin-clavulanate 875-125 MG tablet Commonly known as:  AUGMENTIN Take 1 tablet by mouth 2 (two) times daily.   clonazePAM 0.5 MG tablet Commonly known as:  KLONOPIN Take 0.5 mg by mouth 2 (two) times daily as needed for  anxiety.   ketoconazole 2 % cream Commonly known as:  NIZORAL Apply 1 application topically daily as needed for irritation.   multivitamin with minerals Tabs tablet Take 1 tablet by mouth daily.   predniSONE 5 MG tablet Commonly known as:  DELTASONE Take 1 tablet (5 mg total) by mouth daily with breakfast.   zolpidem 12.5 MG CR tablet Commonly known as:  AMBIEN CR Take 12.5 mg by mouth at bedtime as needed for sleep.      Follow-up Information    RAMACHANDRAN,AJITH, MD. Schedule an appointment as soon as possible for a visit in 1 week(s).   Specialty:  Internal Medicine Contact information: [address] [place] [address] [phone]        Clabe Seal, MD. Schedule an appointment as soon as possible for a visit in 1 week(s).   Specialty:  Rheumatology Contact information: [place] [address] [phone]          Allergies  Allergen Reactions  . Latex Shortness Of Breath    Consultations:  *surgery.    Procedures/Studies: Ct Abdomen Pelvis Wo Contrast  Result Date: 12/15/2015 CLINICAL DATA:  Generalized abdominal pain. EXAM: CT ABDOMEN AND PELVIS WITHOUT CONTRAST TECHNIQUE: Multidetector CT imaging of the abdomen and pelvis was performed  following the standard protocol without IV contrast. COMPARISON:  Plain films 12/15/2015.  CT 05/21/2010 FINDINGS: Lower chest: Dependent atelectasis at the right base. Elevation of the right hemidiaphragm. Left lung base is clear. Heart is borderline in size. No effusions. Small hiatal hernia. Hepatobiliary: No focal hepatic abnormality. Gallbladder unremarkable. Pancreas: No focal abnormality or ductal dilatation. Spleen: No focal abnormality.  Normal size. Adrenals/Urinary Tract: No adrenal abnormality. No focal renal abnormality. No stones or hydronephrosis. Urinary bladder is unremarkable. Stomach/Bowel: Rectal contrast was administered. Significant gaseous  distention of the colon, most pronounced in the transverse colon which measures up to 11 cm. Moderate stool burden in the colon. Small bowel and stomach decompressed. No visible obstructing colonic lesion. Vascular/Lymphatic: Scattered aortic and iliac calcifications. No aneurysm. No adenopathy. Reproductive:  No visible focal abnormality. Other: Small amount of free fluid in the right side of the pelvis. No free air. Musculoskeletal: No acute bony abnormality or focal bone lesion IMPRESSION: Marked gaseous distention of the colon with moderate stool burden. No visible obstructing process. Transverse colon measures up to 11 cm in diameter. Small hiatal hernia.  Right base atelectasis. Small amount of free fluid in the pelvis. Electronically Signed   By: Rolm Baptise M.D.   On: 12/15/2015 13:29  Dg Abd Acute W/chest  Result Date: 12/18/2015 CLINICAL DATA:  Generalized abdominal pain, nausea, and constipation for 3 days, bowel obstruction, history hypertension, small-bowel obstruction, diverticulosis, rheumatoid arthritis EXAM: DG ABDOMEN ACUTE W/ 1V CHEST COMPARISON:  729 1,017 FINDINGS: Nasogastric tube extends into stomach. Minimal enlargement of cardiac silhouette. Mediastinal contours and pulmonary vascularity normal. Lungs clear. No pleural effusion or pneumothorax. Gaseous distention of colon again identified. Some stool in ascending and descending colon. Scattered normal caliber small bowel loops. No definite free intraperitoneal air or bowel wall thickening. Degenerative disc disease changes lumbar spine. IMPRESSION: Persistent gaseous distention of colon without definite evidence of obstruction or perforation. Electronically Signed   By: Lavonia Dana M.D.   On: 12/18/2015 21:52   Dg Abd Acute W/chest  Addendum Date: 12/15/2015   ADDENDUM REPORT: 12/15/2015 11:09 ADDENDUM: The dilated bowel in the left upper abdomen potentially could represent a focus of volvulated sigmoid colon. Given this  possibility, correlation with CT advised. These results were called by telephone at the time of interpretation on 12/15/2015 at 11:08 am to [facility] , who verbally acknowledged these results. Electronically Signed   By: Lowella Grip III M.D.   On: 12/15/2015 11:09  Result Date: 12/15/2015 CLINICAL DATA:  Generalized abdominal pain and distention with vomiting for 4 days EXAM: DG ABDOMEN ACUTE W/ 1V CHEST COMPARISON:  Abdomen series May 24, 2010 FINDINGS: PA chest: No edema or consolidation. Heart size and pulmonary vascularity are normal. No adenopathy. Supine and upright abdomen: There are multiple loops of dilated colon with multiple air-fluid levels. No free air evident. There are small phleboliths in the pelvis. IMPRESSION: Loops of dilated colon with multiple air-fluid levels. This appearance is concerning for distal bowel obstruction. Ileus is a differential consideration. No free air evident. Lungs clear. Electronically Signed: By: Lowella Grip III M.D. On: 12/15/2015 11:02  Dg Abd Portable 1v  Result Date: 12/16/2015 CLINICAL DATA:  Constipation. History of diverticulosis and small bowel obstruction EXAM: PORTABLE ABDOMEN - 1 VIEW COMPARISON:  CT earlier today FINDINGS: Diffuse gaseous distention of the colon again noted, most pronounced in the transverse colon. There is suggestion of pneumatosis within the right colonic wall as seen on CT earlier today. No  free air. No organomegaly. NG tube tip is in the distal stomach or proximal duodenum. IMPRESSION: Stable gaseous distention of the colon. Questionable pneumatosis in the right colon as seen on earlier CT. Electronically Signed   By: Rolm Baptise M.D.   On: 12/16/2015 15:24  Dg Abd Portable 1 View  Result Date: 12/15/2015 CLINICAL DATA:  NG tube placement. EXAM: PORTABLE ABDOMEN - 1 VIEW COMPARISON:  None. FINDINGS: NG tube tip is in the distal stomach with the side port in the stomach. Continued gaseous distention of colon,  particularly the transverse colon. No free air organomegaly. IMPRESSION: NG tube tip in the distal stomach. Electronically Signed   By: Rolm Baptise M.D.   On: 12/15/2015 16:37  Ct Angio Abd/pel W/ And/or W/o  Result Date: 12/16/2015 CLINICAL DATA:  Mid to lower abdominal pain with bloating. Unsuccessful colonoscopy due to ischemic bowel. EXAM: CTA ABDOMEN AND PELVIS wITHOUT AND WITH CONTRAST TECHNIQUE: Multidetector CT imaging of the abdomen and pelvis was performed using the standard protocol during bolus administration of intravenous contrast. Multiplanar reconstructed images and MIPs were obtained and reviewed to evaluate the vascular anatomy. CONTRAST:  100 cc Isovue 370 IV COMPARISON:  12/15/2015 FINDINGS: Lower chest:  Cardiomegaly.  Bibasilar atelectasis.  No effusions. Hepatobiliary: No focal hepatic abnormality. Gallbladder unremarkable. Pancreas: No focal abnormality or ductal dilatation. Spleen: No focal abnormality.  Normal size. Adrenals/Urinary Tract: No adrenal abnormality. No focal renal abnormality. No stones or hydronephrosis. Urinary bladder is unremarkable. Stomach/Bowel: The NG tube is present with the tip in the proximal duodenum. There is mild diffuse wall thickening in the visualized distal esophagus which may be related to esophagitis. Continued dilatation of the colon, slightly decreased since prior study. The transverse colon diameter measures 9.9 cm today compared with 11 cm yesterday. Questionable pneumatosis noted within the cecal wall and descending colon. Vascular/Lymphatic: Aorta is normal caliber. The celiac artery, superior mesenteric artery, inferior mesenteric artery are widely patent. Single renal arteries bilaterally which are widely patent. Iliofemoral vessels widely patent. Reproductive: No visible focal abnormality. Other: Small amount of free fluid noted within the pelvis, similar to prior study. Musculoskeletal: No acute bony abnormality or focal bone lesion. Review  of the MIP images confirms the above findings. IMPRESSION: Questionable pneumatosis within the colonic wall, best seen in the cecum and descending colon. Continued dilatation of the colon, slightly decreased since prior study. NG tube tip is in the proximal duodenum. No evidence of mesenteric arterial stenosis/occlusion. Electronically Signed   By: Rolm Baptise M.D.   On: 12/16/2015 07:33     Subjective: No new complaints.   Discharge Exam: Vitals:   12/21/15 0855 12/21/15 1429  BP: (!) 151/73 (!) 144/77  Pulse: 62 60  Resp:  17  Temp:  98 F (36.7 C)   Vitals:   12/20/15 2225 12/21/15 0627 12/21/15 0855 12/21/15 1429  BP: 136/66 131/70 (!) 151/73 (!) 144/77  Pulse: 63 (!) 54 62 60  Resp: 18 18  17   Temp: 98.6 F (37 C) 98.5 F (36.9 C)  98 F (36.7 C)  TempSrc: Oral   Oral  SpO2: 99% 99%  100%  Weight:      Height:        General: Pt is alert, awake, not in acute distress Cardiovascular: RRR, S1/S2 +, no rubs, no gallops Respiratory: CTA bilaterally, no wheezing, no rhonchi Abdominal: Soft, NT, ND, bowel sounds + Extremities: no edema, no cyanosis    The results of significant diagnostics from this hospitalization (  including imaging, microbiology, ancillary and laboratory) are listed below for reference.     Microbiology: Recent Results (from the past 240 hour(s))  MRSA PCR Screening     Status: None   Collection Time: 12/18/15 11:30 AM  Result Value Ref Range Status   MRSA by PCR NEGATIVE NEGATIVE Final    Comment:        The GeneXpert MRSA Assay (FDA approved for NASAL specimens only), is one component of a comprehensive MRSA colonization surveillance program. It is not intended to diagnose MRSA infection nor to guide or monitor treatment for MRSA infections.      Labs: BNP (last 3 results) No results for input(s): BNP in the last 8760 hours. Basic Metabolic Panel:  Recent Labs Lab 12/17/15 0305 12/18/15 0608 12/19/15 0428 12/20/15 0407  12/21/15 0301  NA 137 136 136 135 137  K 3.7 3.4* 3.6 4.2 4.4  CL 104 102 103 101 104  CO2 21* 26 24 24 26   GLUCOSE 66 126* 109* 175* 166*  BUN 15 7 <5* 6 10  CREATININE 1.41* 1.14 1.27* 1.22 1.27*  CALCIUM 8.5* 8.3* 8.8* 8.8* 8.6*   Liver Function Tests:  Recent Labs Lab 12/15/15 0947  AST 19  ALT 19  ALKPHOS 43  BILITOT 1.1  PROT 7.9  ALBUMIN 4.5    Recent Labs Lab 12/15/15 0947  LIPASE 32   No results for input(s): AMMONIA in the last 168 hours. CBC:  Recent Labs Lab 12/17/15 0305 12/18/15 0608 12/19/15 0428 12/20/15 0407 12/21/15 0301  WBC 17.2* 12.2* 13.7* 8.5 12.8*  HGB 9.1* 8.9* 10.1* 9.3* 9.0*  HCT 29.3* 27.9* 31.1* 28.7* 27.8*  MCV 84.2 82.8 82.1 80.2 80.8  PLT 244 239 290 267 287   Cardiac Enzymes: No results for input(s): CKTOTAL, CKMB, CKMBINDEX, TROPONINI in the last 168 hours. BNP: Invalid input(s): POCBNP CBG:  Recent Labs Lab 12/18/15 1200 12/18/15 1743 12/18/15 2332 12/19/15 0501 12/19/15 1205  GLUCAP 130* 116* 110* 115* 110*   D-Dimer No results for input(s): DDIMER in the last 72 hours. Hgb A1c No results for input(s): HGBA1C in the last 72 hours. Lipid Profile No results for input(s): CHOL, HDL, LDLCALC, TRIG, CHOLHDL, LDLDIRECT in the last 72 hours. Thyroid function studies No results for input(s): TSH, T4TOTAL, T3FREE, THYROIDAB in the last 72 hours.  Invalid input(s): FREET3 Anemia work up No results for input(s): VITAMINB12, FOLATE, FERRITIN, TIBC, IRON, RETICCTPCT in the last 72 hours. Urinalysis    Component Value Date/Time   COLORURINE YELLOW 05/21/2010 1505   APPEARANCEUR CLEAR 05/21/2010 1505   LABSPEC 1.020 05/21/2010 1505   PHURINE 8.5 (H) 05/21/2010 1505   GLUCOSEU NEGATIVE 05/21/2010 1505   HGBUR NEGATIVE 05/21/2010 1505   BILIRUBINUR NEGATIVE 05/21/2010 1505   KETONESUR NEGATIVE 05/21/2010 1505   PROTEINUR 100 (A) 05/21/2010 1505   UROBILINOGEN 0.2 05/21/2010 1505   NITRITE NEGATIVE 05/21/2010 1505    LEUKOCYTESUR NEGATIVE 05/21/2010 1505   Sepsis Labs Invalid input(s): PROCALCITONIN,  WBC,  LACTICIDVEN Microbiology Recent Results (from the past 240 hour(s))  MRSA PCR Screening     Status: None   Collection Time: 12/18/15 11:30 AM  Result Value Ref Range Status   MRSA by PCR NEGATIVE NEGATIVE Final    Comment:        The GeneXpert MRSA Assay (FDA approved for NASAL specimens only), is one component of a comprehensive MRSA colonization surveillance program. It is not intended to diagnose MRSA infection nor to guide or monitor treatment for MRSA  infections.      Time coordinating discharge: Over 30 minutes  SIGNED:   Hosie Poisson, MD  [facility] 12/21/2015, 4:59 PM Pager   If 7PM-7AM, please contact night-coverage [email or web address] Password [email or web address]

## 2015-12-21 NOTE — Progress Notes (Signed)
Physical Therapy Treatment Patient Details Name: Kevin Lopez MRN: [id number] DOB: 04-29-1951 Today's Date: 2016/01/06    History of Present Illness Pt is a very pleasant 65 y/o male s/p abdominal surgery for distended colon on 7/28. PMH including but not limited to diverticulosis, HTN and RA.    PT Comments    Pt progressing to gait with out RW and performed stair training with supervision.  Will plan for high level balance activities next session to address balance deficits.    Follow Up Recommendations  Supervision for mobility/OOB     Equipment Recommendations  None recommended by PT;Other (comment)    Recommendations for Other Services       Precautions / Restrictions Precautions Precautions: Fall Restrictions Weight Bearing Restrictions: No    Mobility  Bed Mobility               General bed mobility comments: Pt received in recliner on arrival.    Transfers Overall transfer level: Modified independent   Transfers: Sit to/from Stand Sit to Stand: Modified independent (Device/Increase time) Stand pivot transfers: Modified independent (Device/Increase time)       General transfer comment: Good technique, no assistance required.    Ambulation/Gait Ambulation/Gait assistance: Supervision Ambulation Distance (Feet): 630 Feet Assistive device: None Gait Pattern/deviations: Step-through pattern;Staggering right Gait velocity: decreased   General Gait Details: LOB to right and able to self correct without assistance.     Stairs Stairs: Yes   Stair Management: One rail Left;Forwards;Step to pattern Number of Stairs: 5 General stair comments: Cues for sequencing and hand placement on rail.  NO LOB.    Wheelchair Mobility    Modified [place] (Stroke Patients Only)       Balance     Sitting balance-Leahy Scale: Normal       Standing balance-Leahy Scale: Good                      Cognition Arousal/Alertness:  Awake/alert Behavior During Therapy: WFL for tasks assessed/performed Overall Cognitive Status: Within Functional Limits for tasks assessed                      Exercises      General Comments        Pertinent Vitals/Pain Pain Assessment: No/denies pain    Home Living                      Prior Function            PT Goals (current goals can now be found in the care plan section) Acute Rehab PT Goals Patient Stated Goal: return home Potential to Achieve Goals: Good Progress towards PT goals: Progressing toward goals    Frequency  Min 3X/week    PT Plan Current plan remains appropriate    Co-evaluation             End of Session Equipment Utilized During Treatment: Gait belt Activity Tolerance: Patient limited by pain Patient left: in chair;with call bell/phone within reach     Time: 1436-1446 PT Time Calculation (min) (ACUTE ONLY): 10 min  Charges:  $Gait Training: 8-22 mins                    G Codes:      Cristela Blue 01-06-2016, 3:24 PM  Governor Rooks, PTA pager [phone]

## 2015-12-21 NOTE — Progress Notes (Signed)
Patient discharged to home with instructions. 

## 2015-12-25 DIAGNOSIS — M79673 Pain in unspecified foot: Secondary | ICD-10-CM | POA: Diagnosis not present

## 2015-12-25 DIAGNOSIS — Z79899 Other long term (current) drug therapy: Secondary | ICD-10-CM | POA: Diagnosis not present

## 2015-12-25 DIAGNOSIS — M0589 Other rheumatoid arthritis with rheumatoid factor of multiple sites: Secondary | ICD-10-CM | POA: Diagnosis not present

## 2015-12-25 DIAGNOSIS — M549 Dorsalgia, unspecified: Secondary | ICD-10-CM | POA: Diagnosis not present

## 2016-01-11 DIAGNOSIS — Z79899 Other long term (current) drug therapy: Secondary | ICD-10-CM | POA: Diagnosis not present

## 2016-01-11 DIAGNOSIS — M549 Dorsalgia, unspecified: Secondary | ICD-10-CM | POA: Diagnosis not present

## 2016-01-11 DIAGNOSIS — M79641 Pain in right hand: Secondary | ICD-10-CM | POA: Diagnosis not present

## 2016-01-11 DIAGNOSIS — M0589 Other rheumatoid arthritis with rheumatoid factor of multiple sites: Secondary | ICD-10-CM | POA: Diagnosis not present

## 2016-02-13 DIAGNOSIS — M2550 Pain in unspecified joint: Secondary | ICD-10-CM | POA: Diagnosis not present

## 2016-02-13 DIAGNOSIS — M0579 Rheumatoid arthritis with rheumatoid factor of multiple sites without organ or systems involvement: Secondary | ICD-10-CM | POA: Diagnosis not present

## 2016-02-13 DIAGNOSIS — M25539 Pain in unspecified wrist: Secondary | ICD-10-CM | POA: Diagnosis not present

## 2016-02-13 DIAGNOSIS — Z79899 Other long term (current) drug therapy: Secondary | ICD-10-CM | POA: Diagnosis not present

## 2016-03-01 DIAGNOSIS — R350 Frequency of micturition: Secondary | ICD-10-CM | POA: Diagnosis not present

## 2016-03-01 DIAGNOSIS — Z125 Encounter for screening for malignant neoplasm of prostate: Secondary | ICD-10-CM | POA: Diagnosis not present

## 2016-03-01 DIAGNOSIS — N529 Male erectile dysfunction, unspecified: Secondary | ICD-10-CM | POA: Diagnosis not present

## 2016-03-04 DIAGNOSIS — M25562 Pain in left knee: Secondary | ICD-10-CM | POA: Diagnosis not present

## 2016-03-04 DIAGNOSIS — M810 Age-related osteoporosis without current pathological fracture: Secondary | ICD-10-CM | POA: Diagnosis not present

## 2016-03-04 DIAGNOSIS — M0579 Rheumatoid arthritis with rheumatoid factor of multiple sites without organ or systems involvement: Secondary | ICD-10-CM | POA: Diagnosis not present

## 2016-03-04 DIAGNOSIS — R42 Dizziness and giddiness: Secondary | ICD-10-CM | POA: Diagnosis not present

## 2016-03-04 DIAGNOSIS — M25539 Pain in unspecified wrist: Secondary | ICD-10-CM | POA: Diagnosis not present

## 2016-03-04 DIAGNOSIS — M1712 Unilateral primary osteoarthritis, left knee: Secondary | ICD-10-CM | POA: Diagnosis not present

## 2016-03-04 DIAGNOSIS — G8929 Other chronic pain: Secondary | ICD-10-CM | POA: Diagnosis not present

## 2016-03-27 DIAGNOSIS — M0589 Other rheumatoid arthritis with rheumatoid factor of multiple sites: Secondary | ICD-10-CM | POA: Diagnosis not present

## 2016-03-27 DIAGNOSIS — M542 Cervicalgia: Secondary | ICD-10-CM | POA: Diagnosis not present

## 2016-03-27 DIAGNOSIS — M79641 Pain in right hand: Secondary | ICD-10-CM | POA: Diagnosis not present

## 2016-03-27 DIAGNOSIS — Z79899 Other long term (current) drug therapy: Secondary | ICD-10-CM | POA: Diagnosis not present

## 2016-03-27 DIAGNOSIS — M47812 Spondylosis without myelopathy or radiculopathy, cervical region: Secondary | ICD-10-CM | POA: Diagnosis not present

## 2016-03-27 DIAGNOSIS — M549 Dorsalgia, unspecified: Secondary | ICD-10-CM | POA: Diagnosis not present

## 2016-04-17 DIAGNOSIS — E782 Mixed hyperlipidemia: Secondary | ICD-10-CM | POA: Diagnosis not present

## 2016-04-17 DIAGNOSIS — R1084 Generalized abdominal pain: Secondary | ICD-10-CM | POA: Diagnosis not present

## 2016-04-17 DIAGNOSIS — E119 Type 2 diabetes mellitus without complications: Secondary | ICD-10-CM | POA: Diagnosis not present

## 2016-04-17 DIAGNOSIS — I10 Essential (primary) hypertension: Secondary | ICD-10-CM | POA: Diagnosis not present

## 2016-04-24 DIAGNOSIS — K5901 Slow transit constipation: Secondary | ICD-10-CM | POA: Diagnosis not present

## 2016-04-24 DIAGNOSIS — E1121 Type 2 diabetes mellitus with diabetic nephropathy: Secondary | ICD-10-CM | POA: Diagnosis not present

## 2016-04-24 DIAGNOSIS — E119 Type 2 diabetes mellitus without complications: Secondary | ICD-10-CM | POA: Diagnosis not present

## 2016-04-24 DIAGNOSIS — M0589 Other rheumatoid arthritis with rheumatoid factor of multiple sites: Secondary | ICD-10-CM | POA: Diagnosis not present

## 2016-05-06 DIAGNOSIS — R140 Abdominal distension (gaseous): Secondary | ICD-10-CM | POA: Diagnosis not present

## 2016-05-06 DIAGNOSIS — R933 Abnormal findings on diagnostic imaging of other parts of digestive tract: Secondary | ICD-10-CM | POA: Diagnosis not present

## 2016-05-06 DIAGNOSIS — K5900 Constipation, unspecified: Secondary | ICD-10-CM | POA: Diagnosis not present

## 2016-06-12 DIAGNOSIS — R140 Abdominal distension (gaseous): Secondary | ICD-10-CM | POA: Diagnosis not present

## 2016-06-12 DIAGNOSIS — K5900 Constipation, unspecified: Secondary | ICD-10-CM | POA: Diagnosis not present

## 2016-06-12 DIAGNOSIS — R933 Abnormal findings on diagnostic imaging of other parts of digestive tract: Secondary | ICD-10-CM | POA: Diagnosis not present

## 2016-07-02 DIAGNOSIS — M549 Dorsalgia, unspecified: Secondary | ICD-10-CM | POA: Diagnosis not present

## 2016-07-02 DIAGNOSIS — M0589 Other rheumatoid arthritis with rheumatoid factor of multiple sites: Secondary | ICD-10-CM | POA: Diagnosis not present

## 2016-07-02 DIAGNOSIS — Z79899 Other long term (current) drug therapy: Secondary | ICD-10-CM | POA: Diagnosis not present

## 2016-07-02 DIAGNOSIS — M79641 Pain in right hand: Secondary | ICD-10-CM | POA: Diagnosis not present

## 2016-07-15 DIAGNOSIS — R739 Hyperglycemia, unspecified: Secondary | ICD-10-CM | POA: Diagnosis not present

## 2016-07-15 DIAGNOSIS — E782 Mixed hyperlipidemia: Secondary | ICD-10-CM | POA: Diagnosis not present

## 2016-07-15 DIAGNOSIS — Z79899 Other long term (current) drug therapy: Secondary | ICD-10-CM | POA: Diagnosis not present

## 2016-07-15 DIAGNOSIS — I10 Essential (primary) hypertension: Secondary | ICD-10-CM | POA: Diagnosis not present

## 2016-07-15 DIAGNOSIS — I2510 Atherosclerotic heart disease of native coronary artery without angina pectoris: Secondary | ICD-10-CM | POA: Diagnosis not present

## 2016-07-15 DIAGNOSIS — R7989 Other specified abnormal findings of blood chemistry: Secondary | ICD-10-CM | POA: Diagnosis not present

## 2016-07-16 DIAGNOSIS — M25539 Pain in unspecified wrist: Secondary | ICD-10-CM | POA: Diagnosis not present

## 2016-07-16 DIAGNOSIS — Z79899 Other long term (current) drug therapy: Secondary | ICD-10-CM | POA: Diagnosis not present

## 2016-07-16 DIAGNOSIS — Z13820 Encounter for screening for osteoporosis: Secondary | ICD-10-CM | POA: Diagnosis not present

## 2016-07-16 DIAGNOSIS — M25562 Pain in left knee: Secondary | ICD-10-CM | POA: Diagnosis not present

## 2016-07-16 DIAGNOSIS — M0579 Rheumatoid arthritis with rheumatoid factor of multiple sites without organ or systems involvement: Secondary | ICD-10-CM | POA: Diagnosis not present

## 2016-07-16 DIAGNOSIS — G8929 Other chronic pain: Secondary | ICD-10-CM | POA: Diagnosis not present

## 2016-07-16 DIAGNOSIS — M170 Bilateral primary osteoarthritis of knee: Secondary | ICD-10-CM | POA: Diagnosis not present

## 2016-07-16 DIAGNOSIS — E559 Vitamin D deficiency, unspecified: Secondary | ICD-10-CM | POA: Diagnosis not present

## 2016-08-05 DIAGNOSIS — J301 Allergic rhinitis due to pollen: Secondary | ICD-10-CM | POA: Diagnosis not present

## 2016-08-05 DIAGNOSIS — N183 Chronic kidney disease, stage 3 (moderate): Secondary | ICD-10-CM | POA: Diagnosis not present

## 2016-08-05 DIAGNOSIS — I129 Hypertensive chronic kidney disease with stage 1 through stage 4 chronic kidney disease, or unspecified chronic kidney disease: Secondary | ICD-10-CM | POA: Diagnosis not present

## 2016-08-05 DIAGNOSIS — E1122 Type 2 diabetes mellitus with diabetic chronic kidney disease: Secondary | ICD-10-CM | POA: Diagnosis not present

## 2016-08-08 DIAGNOSIS — L02224 Furuncle of groin: Secondary | ICD-10-CM | POA: Diagnosis not present

## 2016-08-08 DIAGNOSIS — L700 Acne vulgaris: Secondary | ICD-10-CM | POA: Diagnosis not present

## 2016-08-08 DIAGNOSIS — S50862A Insect bite (nonvenomous) of left forearm, initial encounter: Secondary | ICD-10-CM | POA: Diagnosis not present

## 2016-08-08 DIAGNOSIS — B9689 Other specified bacterial agents as the cause of diseases classified elsewhere: Secondary | ICD-10-CM | POA: Diagnosis not present

## 2016-08-08 DIAGNOSIS — S50861A Insect bite (nonvenomous) of right forearm, initial encounter: Secondary | ICD-10-CM | POA: Diagnosis not present

## 2016-08-13 DIAGNOSIS — M25539 Pain in unspecified wrist: Secondary | ICD-10-CM | POA: Diagnosis not present

## 2016-08-13 DIAGNOSIS — M2550 Pain in unspecified joint: Secondary | ICD-10-CM | POA: Diagnosis not present

## 2016-08-13 DIAGNOSIS — N429 Disorder of prostate, unspecified: Secondary | ICD-10-CM | POA: Diagnosis not present

## 2016-08-13 DIAGNOSIS — Z13820 Encounter for screening for osteoporosis: Secondary | ICD-10-CM | POA: Diagnosis not present

## 2016-08-13 DIAGNOSIS — G8929 Other chronic pain: Secondary | ICD-10-CM | POA: Diagnosis not present

## 2016-08-13 DIAGNOSIS — Z79899 Other long term (current) drug therapy: Secondary | ICD-10-CM | POA: Diagnosis not present

## 2016-08-13 DIAGNOSIS — M170 Bilateral primary osteoarthritis of knee: Secondary | ICD-10-CM | POA: Diagnosis not present

## 2016-08-13 DIAGNOSIS — E531 Pyridoxine deficiency: Secondary | ICD-10-CM | POA: Diagnosis not present

## 2016-08-13 DIAGNOSIS — M25562 Pain in left knee: Secondary | ICD-10-CM | POA: Diagnosis not present

## 2016-08-13 DIAGNOSIS — M0579 Rheumatoid arthritis with rheumatoid factor of multiple sites without organ or systems involvement: Secondary | ICD-10-CM | POA: Diagnosis not present

## 2016-08-13 DIAGNOSIS — E559 Vitamin D deficiency, unspecified: Secondary | ICD-10-CM | POA: Diagnosis not present

## 2016-08-21 IMAGING — RF DG UGI W/ HIGH DENSITY W/KUB
19 of 24 series · 19 of 24 positions shown · non-contrast
Comparison: None.

CLINICAL DATA: Dysphagia, indigestion.

EXAM:
UPPER GI SERIES WITH KUB
TECHNIQUE: After obtaining a scout radiograph a routine upper GI series was
performed using thin and high density barium.
FLUOROSCOPY TIME:  Radiation Exposure Index (as provided by the
fluoroscopic device): 76
If the device does not provide the exposure index:
Fluoroscopy Time (in minutes and seconds):  3 minutes 0 seconds
Number of Acquired Images:  1

[Series 1: run · 1 of 1 slices shown (1 of 19)]
[im 1/1]
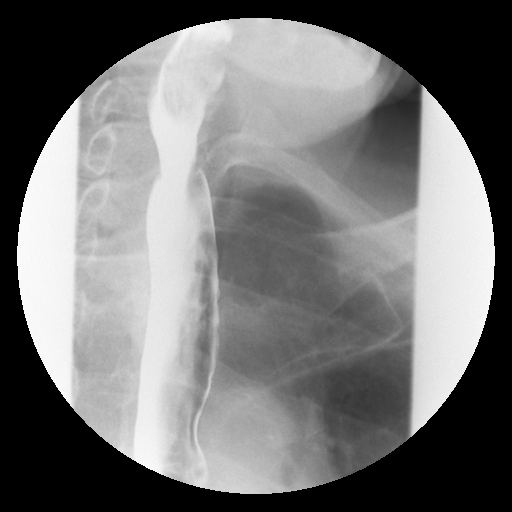

[Series 2: run · 1 of 1 slices shown (2 of 19)]
[im 1/1]
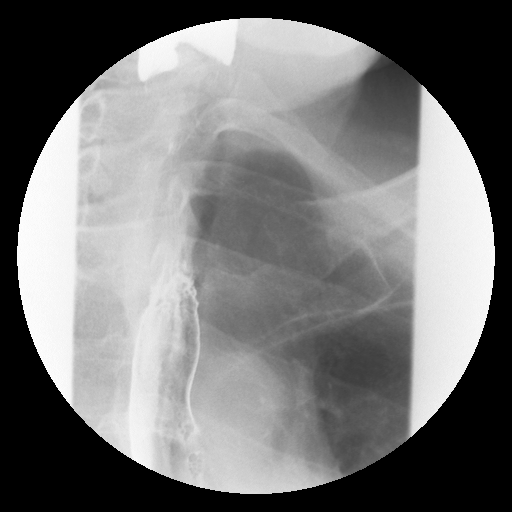

[Series 4: run · 1 of 1 slices shown (3 of 19)]
[im 1/1]
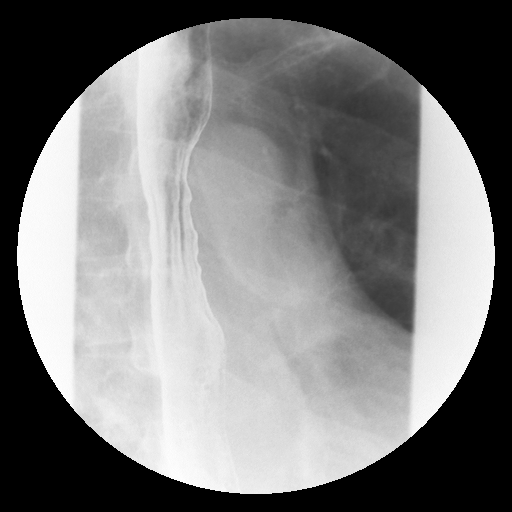

[Series 5: run · 1 of 1 slices shown (4 of 19)]
[im 1/1]
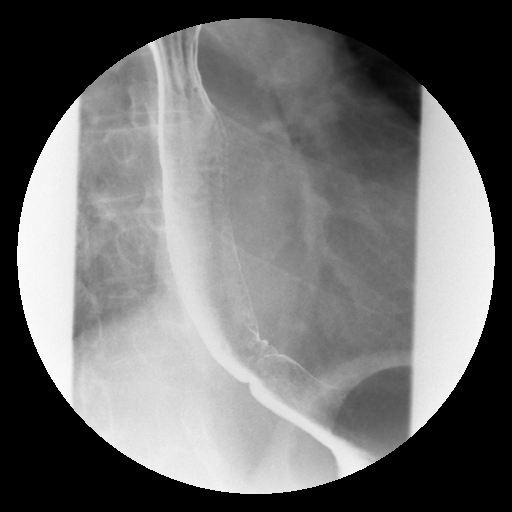

[Series 6: run · 1 of 1 slices shown (5 of 19)]
[im 1/1]
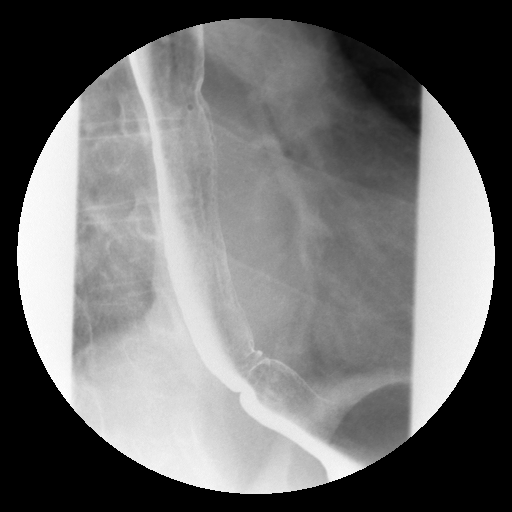

[Series 7: run · 1 of 1 slices shown (6 of 19)]
[im 1/1]
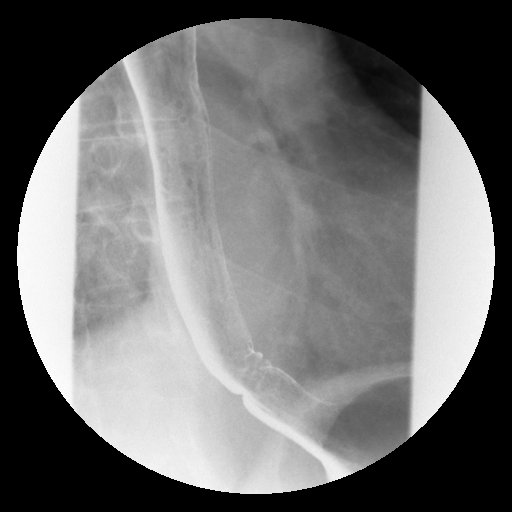

[Series 9: run · 1 of 1 slices shown (7 of 19)]
[im 1/1]
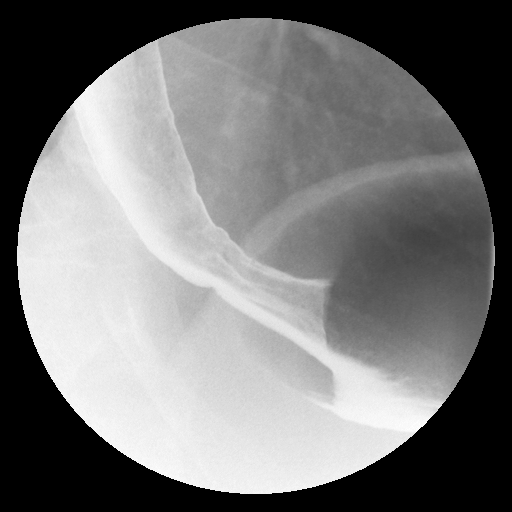

[Series 10: run · 1 of 1 slices shown (8 of 19)]
[im 1/1]
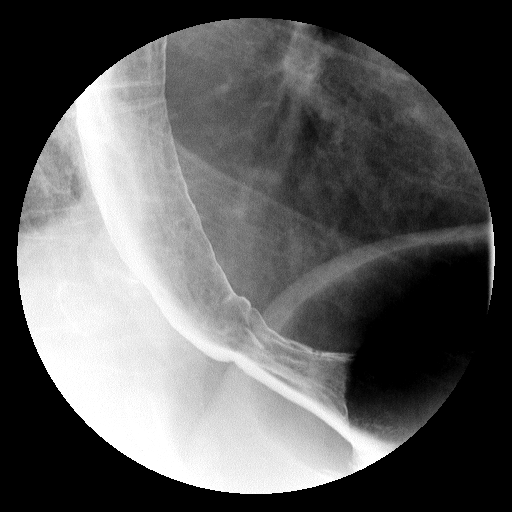

[Series 11: run · 1 of 1 slices shown (9 of 19)]
[im 1/1]
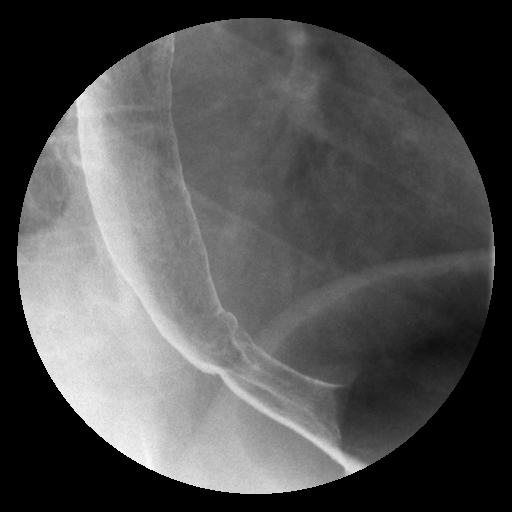

[Series 13: run · 1 of 1 slices shown (10 of 19)]
[im 1/1]
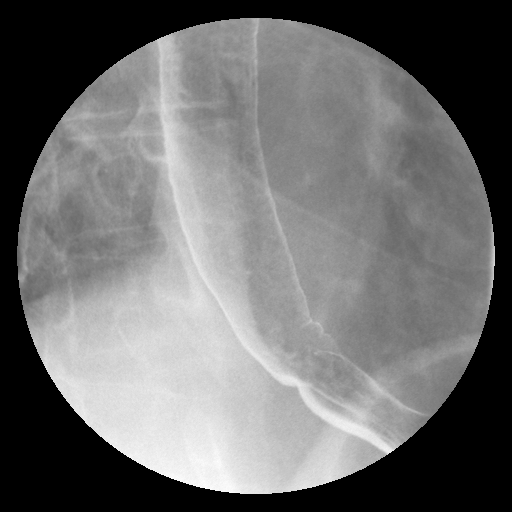

[Series 14: run · 1 of 1 slices shown (11 of 19)]
[im 1/1]
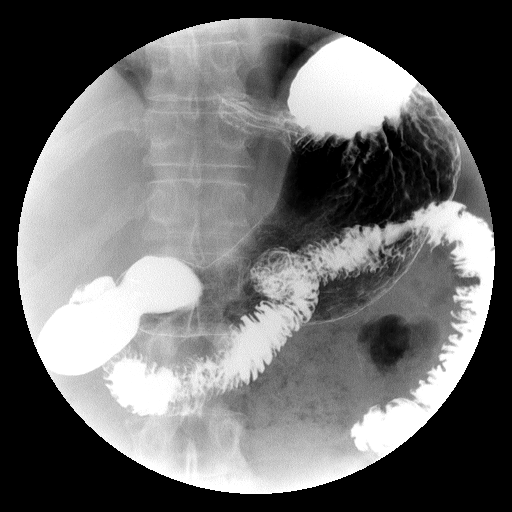

[Series 15: run · 1 of 1 slices shown (12 of 19)]
[im 1/1]
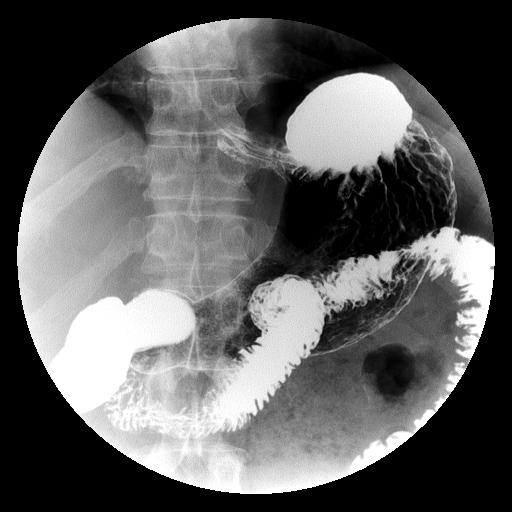

[Series 16: run · 1 of 1 slices shown (13 of 19)]
[im 1/1]
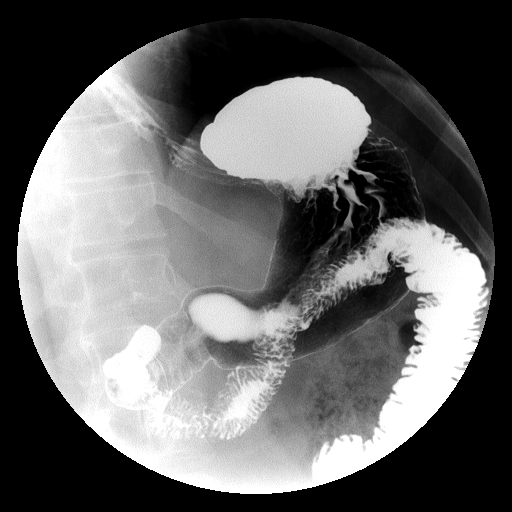

[Series 18: run · 1 of 1 slices shown (14 of 19)]
[im 1/1]
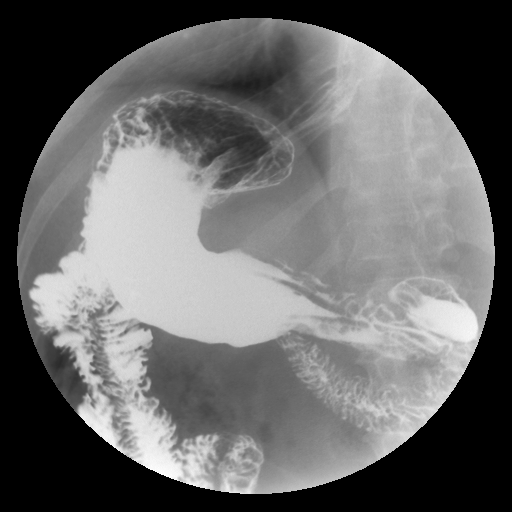

[Series 19: run · 1 of 1 slices shown (15 of 19)]
[im 1/1]
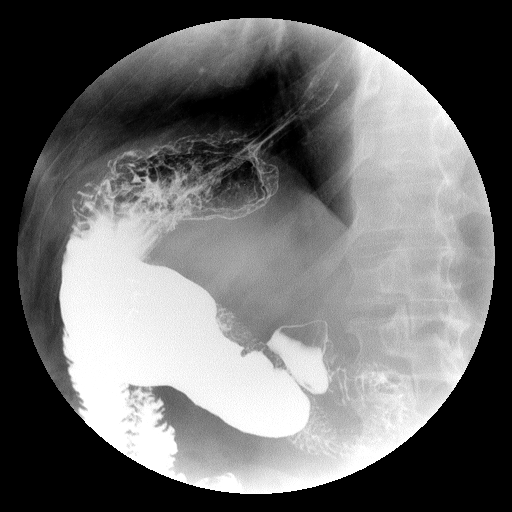

[Series 20: run · 1 of 1 slices shown (16 of 19)]
[im 1/1]
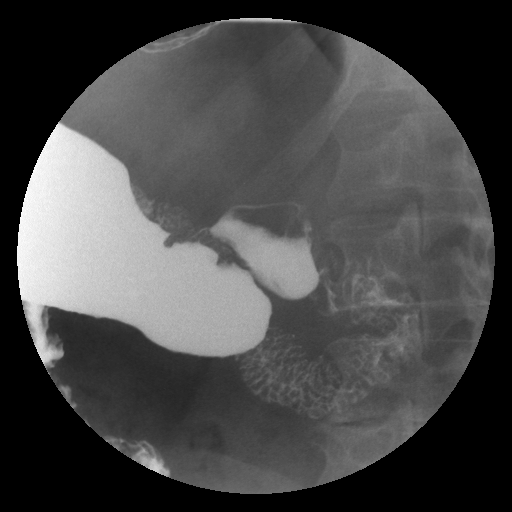

[Series 21: run · 1 of 1 slices shown (17 of 19)]
[im 1/1]
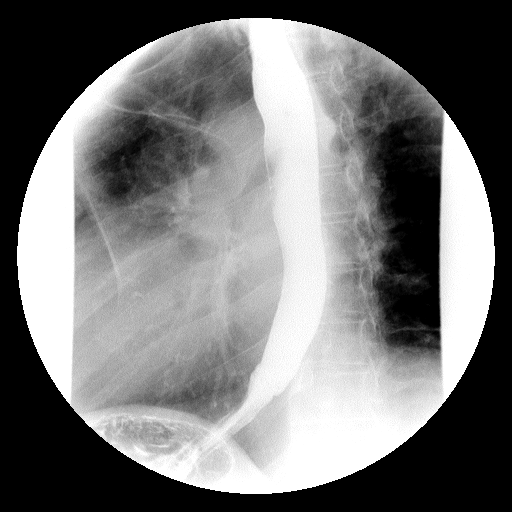

[Series 23: run · 1 of 1 slices shown (18 of 19)]
[im 1/1]
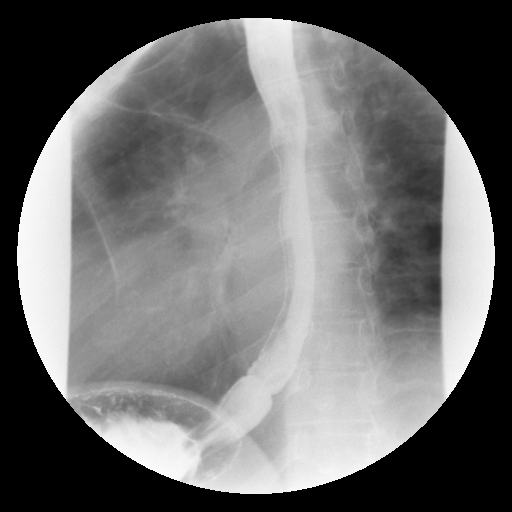

[Series 24: run · 1 of 1 slices shown (19 of 19)]
[im 1/1]
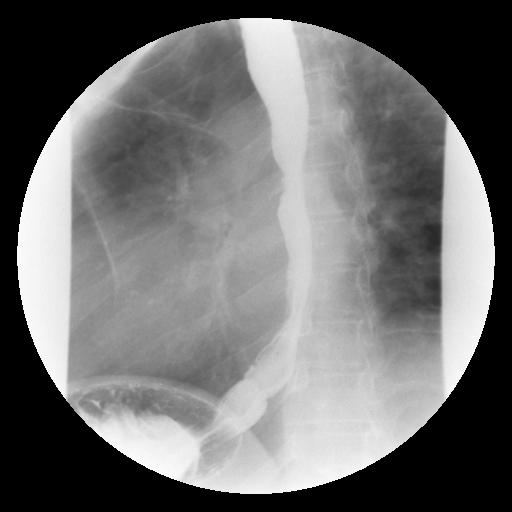

[19 of 24 positions shown; findings below may reference images not displayed]

FINDINGS: Scout film of the abdomen demonstrates moderate stool in the colon.
Nonobstructive bowel gas pattern. No free air organomegaly.

Fluoroscopic evaluation of swallowing demonstrates normal esophageal
peristalsis. There is slight mucosal irregularity within the distal
esophagus. Mild mucosal ring. No reflux with the water siphon
maneuver.

Stomach, duodenal bulb and duodenal sweep are unremarkable. No
ulceration, fold thickening or mass.

Patient swallowed a 13 mm barium tablet which briefly sticks in the
distal esophagus, then passes into the stomach.
IMPRESSION: Mild mucosal ring in the distal esophagus with slight mucosal
irregularity above the mucosal ring. This may be related to reflux.

Brief sticking of the 13 mm barium tablet in the distal esophagus at
this area of irregularity and mucosal ring.

Otherwise unremarkable study.

## 2016-08-30 DIAGNOSIS — R350 Frequency of micturition: Secondary | ICD-10-CM | POA: Diagnosis not present

## 2016-08-30 DIAGNOSIS — N529 Male erectile dysfunction, unspecified: Secondary | ICD-10-CM | POA: Diagnosis not present

## 2016-08-30 DIAGNOSIS — N401 Enlarged prostate with lower urinary tract symptoms: Secondary | ICD-10-CM | POA: Diagnosis not present

## 2016-08-30 DIAGNOSIS — Z125 Encounter for screening for malignant neoplasm of prostate: Secondary | ICD-10-CM | POA: Diagnosis not present

## 2016-09-23 DIAGNOSIS — M25539 Pain in unspecified wrist: Secondary | ICD-10-CM | POA: Diagnosis not present

## 2016-09-23 DIAGNOSIS — M25562 Pain in left knee: Secondary | ICD-10-CM | POA: Diagnosis not present

## 2016-09-23 DIAGNOSIS — G8929 Other chronic pain: Secondary | ICD-10-CM | POA: Diagnosis not present

## 2016-09-23 DIAGNOSIS — Z13820 Encounter for screening for osteoporosis: Secondary | ICD-10-CM | POA: Diagnosis not present

## 2016-09-23 DIAGNOSIS — M0579 Rheumatoid arthritis with rheumatoid factor of multiple sites without organ or systems involvement: Secondary | ICD-10-CM | POA: Diagnosis not present

## 2016-09-23 DIAGNOSIS — E559 Vitamin D deficiency, unspecified: Secondary | ICD-10-CM | POA: Diagnosis not present

## 2016-09-23 DIAGNOSIS — M170 Bilateral primary osteoarthritis of knee: Secondary | ICD-10-CM | POA: Diagnosis not present

## 2016-11-07 DIAGNOSIS — G8929 Other chronic pain: Secondary | ICD-10-CM | POA: Diagnosis not present

## 2016-11-07 DIAGNOSIS — E559 Vitamin D deficiency, unspecified: Secondary | ICD-10-CM | POA: Diagnosis not present

## 2016-11-07 DIAGNOSIS — M2550 Pain in unspecified joint: Secondary | ICD-10-CM | POA: Diagnosis not present

## 2016-11-07 DIAGNOSIS — M25539 Pain in unspecified wrist: Secondary | ICD-10-CM | POA: Diagnosis not present

## 2016-11-07 DIAGNOSIS — M170 Bilateral primary osteoarthritis of knee: Secondary | ICD-10-CM | POA: Diagnosis not present

## 2016-11-07 DIAGNOSIS — M25562 Pain in left knee: Secondary | ICD-10-CM | POA: Diagnosis not present

## 2016-11-07 DIAGNOSIS — E531 Pyridoxine deficiency: Secondary | ICD-10-CM | POA: Diagnosis not present

## 2016-11-07 DIAGNOSIS — M0579 Rheumatoid arthritis with rheumatoid factor of multiple sites without organ or systems involvement: Secondary | ICD-10-CM | POA: Diagnosis not present

## 2016-11-07 DIAGNOSIS — N429 Disorder of prostate, unspecified: Secondary | ICD-10-CM | POA: Diagnosis not present

## 2016-11-07 DIAGNOSIS — Z13820 Encounter for screening for osteoporosis: Secondary | ICD-10-CM | POA: Diagnosis not present

## 2016-11-07 DIAGNOSIS — Z79899 Other long term (current) drug therapy: Secondary | ICD-10-CM | POA: Diagnosis not present

## 2017-01-01 DIAGNOSIS — I10 Essential (primary) hypertension: Secondary | ICD-10-CM | POA: Diagnosis not present

## 2017-01-01 DIAGNOSIS — K5901 Slow transit constipation: Secondary | ICD-10-CM | POA: Diagnosis not present

## 2017-01-01 DIAGNOSIS — E119 Type 2 diabetes mellitus without complications: Secondary | ICD-10-CM | POA: Diagnosis not present

## 2017-01-01 DIAGNOSIS — Z0000 Encounter for general adult medical examination without abnormal findings: Secondary | ICD-10-CM | POA: Diagnosis not present

## 2017-01-01 DIAGNOSIS — E782 Mixed hyperlipidemia: Secondary | ICD-10-CM | POA: Diagnosis not present

## 2017-01-01 DIAGNOSIS — Z125 Encounter for screening for malignant neoplasm of prostate: Secondary | ICD-10-CM | POA: Diagnosis not present

## 2017-01-07 DIAGNOSIS — M542 Cervicalgia: Secondary | ICD-10-CM | POA: Diagnosis not present

## 2017-01-07 DIAGNOSIS — Z79899 Other long term (current) drug therapy: Secondary | ICD-10-CM | POA: Diagnosis not present

## 2017-01-07 DIAGNOSIS — M79641 Pain in right hand: Secondary | ICD-10-CM | POA: Diagnosis not present

## 2017-01-07 DIAGNOSIS — M0589 Other rheumatoid arthritis with rheumatoid factor of multiple sites: Secondary | ICD-10-CM | POA: Diagnosis not present

## 2017-01-07 DIAGNOSIS — M549 Dorsalgia, unspecified: Secondary | ICD-10-CM | POA: Diagnosis not present

## 2017-01-08 DIAGNOSIS — I129 Hypertensive chronic kidney disease with stage 1 through stage 4 chronic kidney disease, or unspecified chronic kidney disease: Secondary | ICD-10-CM | POA: Diagnosis not present

## 2017-01-08 DIAGNOSIS — E1121 Type 2 diabetes mellitus with diabetic nephropathy: Secondary | ICD-10-CM | POA: Diagnosis not present

## 2017-01-08 DIAGNOSIS — N183 Chronic kidney disease, stage 3 (moderate): Secondary | ICD-10-CM | POA: Diagnosis not present

## 2017-01-08 DIAGNOSIS — E782 Mixed hyperlipidemia: Secondary | ICD-10-CM | POA: Diagnosis not present

## 2017-01-30 DIAGNOSIS — Z13820 Encounter for screening for osteoporosis: Secondary | ICD-10-CM | POA: Diagnosis not present

## 2017-01-30 DIAGNOSIS — M25562 Pain in left knee: Secondary | ICD-10-CM | POA: Diagnosis not present

## 2017-01-30 DIAGNOSIS — Z79899 Other long term (current) drug therapy: Secondary | ICD-10-CM | POA: Diagnosis not present

## 2017-01-30 DIAGNOSIS — E531 Pyridoxine deficiency: Secondary | ICD-10-CM | POA: Diagnosis not present

## 2017-01-30 DIAGNOSIS — M170 Bilateral primary osteoarthritis of knee: Secondary | ICD-10-CM | POA: Diagnosis not present

## 2017-01-30 DIAGNOSIS — E559 Vitamin D deficiency, unspecified: Secondary | ICD-10-CM | POA: Diagnosis not present

## 2017-01-30 DIAGNOSIS — M0579 Rheumatoid arthritis with rheumatoid factor of multiple sites without organ or systems involvement: Secondary | ICD-10-CM | POA: Diagnosis not present

## 2017-01-30 DIAGNOSIS — G8929 Other chronic pain: Secondary | ICD-10-CM | POA: Diagnosis not present

## 2017-01-30 DIAGNOSIS — M25539 Pain in unspecified wrist: Secondary | ICD-10-CM | POA: Diagnosis not present

## 2017-02-11 DIAGNOSIS — E559 Vitamin D deficiency, unspecified: Secondary | ICD-10-CM | POA: Diagnosis not present

## 2017-02-11 DIAGNOSIS — M25539 Pain in unspecified wrist: Secondary | ICD-10-CM | POA: Diagnosis not present

## 2017-02-11 DIAGNOSIS — M170 Bilateral primary osteoarthritis of knee: Secondary | ICD-10-CM | POA: Diagnosis not present

## 2017-02-11 DIAGNOSIS — Z13820 Encounter for screening for osteoporosis: Secondary | ICD-10-CM | POA: Diagnosis not present

## 2017-02-11 DIAGNOSIS — E612 Magnesium deficiency: Secondary | ICD-10-CM | POA: Diagnosis not present

## 2017-02-11 DIAGNOSIS — E539 Vitamin B deficiency, unspecified: Secondary | ICD-10-CM | POA: Diagnosis not present

## 2017-02-11 DIAGNOSIS — M0579 Rheumatoid arthritis with rheumatoid factor of multiple sites without organ or systems involvement: Secondary | ICD-10-CM | POA: Diagnosis not present

## 2017-03-07 DIAGNOSIS — R350 Frequency of micturition: Secondary | ICD-10-CM | POA: Diagnosis not present

## 2017-03-07 DIAGNOSIS — N401 Enlarged prostate with lower urinary tract symptoms: Secondary | ICD-10-CM | POA: Diagnosis not present

## 2017-03-07 DIAGNOSIS — N529 Male erectile dysfunction, unspecified: Secondary | ICD-10-CM | POA: Diagnosis not present

## 2017-03-07 DIAGNOSIS — Z125 Encounter for screening for malignant neoplasm of prostate: Secondary | ICD-10-CM | POA: Diagnosis not present

## 2017-03-13 DIAGNOSIS — M1712 Unilateral primary osteoarthritis, left knee: Secondary | ICD-10-CM | POA: Diagnosis not present

## 2017-03-13 DIAGNOSIS — Z13820 Encounter for screening for osteoporosis: Secondary | ICD-10-CM | POA: Diagnosis not present

## 2017-03-13 DIAGNOSIS — M170 Bilateral primary osteoarthritis of knee: Secondary | ICD-10-CM | POA: Diagnosis not present

## 2017-03-13 DIAGNOSIS — M0579 Rheumatoid arthritis with rheumatoid factor of multiple sites without organ or systems involvement: Secondary | ICD-10-CM | POA: Diagnosis not present

## 2017-03-13 DIAGNOSIS — E559 Vitamin D deficiency, unspecified: Secondary | ICD-10-CM | POA: Diagnosis not present

## 2017-03-13 DIAGNOSIS — E539 Vitamin B deficiency, unspecified: Secondary | ICD-10-CM | POA: Diagnosis not present

## 2017-03-13 DIAGNOSIS — M25539 Pain in unspecified wrist: Secondary | ICD-10-CM | POA: Diagnosis not present

## 2017-03-13 DIAGNOSIS — E612 Magnesium deficiency: Secondary | ICD-10-CM | POA: Diagnosis not present

## 2017-04-15 DIAGNOSIS — Z79899 Other long term (current) drug therapy: Secondary | ICD-10-CM | POA: Diagnosis not present

## 2017-04-15 DIAGNOSIS — M25539 Pain in unspecified wrist: Secondary | ICD-10-CM | POA: Diagnosis not present

## 2017-04-15 DIAGNOSIS — M1712 Unilateral primary osteoarthritis, left knee: Secondary | ICD-10-CM | POA: Diagnosis not present

## 2017-04-15 DIAGNOSIS — E539 Vitamin B deficiency, unspecified: Secondary | ICD-10-CM | POA: Diagnosis not present

## 2017-04-15 DIAGNOSIS — E612 Magnesium deficiency: Secondary | ICD-10-CM | POA: Diagnosis not present

## 2017-04-15 DIAGNOSIS — M0579 Rheumatoid arthritis with rheumatoid factor of multiple sites without organ or systems involvement: Secondary | ICD-10-CM | POA: Diagnosis not present

## 2017-04-15 DIAGNOSIS — Z13820 Encounter for screening for osteoporosis: Secondary | ICD-10-CM | POA: Diagnosis not present

## 2017-04-15 DIAGNOSIS — M170 Bilateral primary osteoarthritis of knee: Secondary | ICD-10-CM | POA: Diagnosis not present

## 2017-04-15 DIAGNOSIS — E559 Vitamin D deficiency, unspecified: Secondary | ICD-10-CM | POA: Diagnosis not present

## 2017-04-15 DIAGNOSIS — M2550 Pain in unspecified joint: Secondary | ICD-10-CM | POA: Diagnosis not present

## 2017-05-21 DIAGNOSIS — I129 Hypertensive chronic kidney disease with stage 1 through stage 4 chronic kidney disease, or unspecified chronic kidney disease: Secondary | ICD-10-CM | POA: Diagnosis not present

## 2017-05-21 DIAGNOSIS — E782 Mixed hyperlipidemia: Secondary | ICD-10-CM | POA: Diagnosis not present

## 2017-05-28 DIAGNOSIS — E782 Mixed hyperlipidemia: Secondary | ICD-10-CM | POA: Diagnosis not present

## 2017-05-28 DIAGNOSIS — E1122 Type 2 diabetes mellitus with diabetic chronic kidney disease: Secondary | ICD-10-CM | POA: Diagnosis not present

## 2017-05-28 DIAGNOSIS — I129 Hypertensive chronic kidney disease with stage 1 through stage 4 chronic kidney disease, or unspecified chronic kidney disease: Secondary | ICD-10-CM | POA: Diagnosis not present

## 2017-05-28 DIAGNOSIS — Z23 Encounter for immunization: Secondary | ICD-10-CM | POA: Diagnosis not present

## 2017-05-28 DIAGNOSIS — R21 Rash and other nonspecific skin eruption: Secondary | ICD-10-CM | POA: Diagnosis not present

## 2017-06-02 DIAGNOSIS — L739 Follicular disorder, unspecified: Secondary | ICD-10-CM | POA: Diagnosis not present

## 2017-06-02 DIAGNOSIS — L700 Acne vulgaris: Secondary | ICD-10-CM | POA: Diagnosis not present

## 2017-06-02 DIAGNOSIS — L732 Hidradenitis suppurativa: Secondary | ICD-10-CM | POA: Diagnosis not present

## 2017-06-03 DIAGNOSIS — H9113 Presbycusis, bilateral: Secondary | ICD-10-CM | POA: Diagnosis not present

## 2017-06-04 DIAGNOSIS — H40013 Open angle with borderline findings, low risk, bilateral: Secondary | ICD-10-CM | POA: Diagnosis not present

## 2017-06-11 DIAGNOSIS — M545 Low back pain: Secondary | ICD-10-CM | POA: Diagnosis not present

## 2017-06-11 DIAGNOSIS — M5441 Lumbago with sciatica, right side: Secondary | ICD-10-CM | POA: Diagnosis not present

## 2017-06-13 DIAGNOSIS — M545 Low back pain: Secondary | ICD-10-CM | POA: Diagnosis not present

## 2017-06-17 DIAGNOSIS — M5416 Radiculopathy, lumbar region: Secondary | ICD-10-CM | POA: Diagnosis not present

## 2017-06-18 DIAGNOSIS — M79641 Pain in right hand: Secondary | ICD-10-CM | POA: Diagnosis not present

## 2017-06-18 DIAGNOSIS — M542 Cervicalgia: Secondary | ICD-10-CM | POA: Diagnosis not present

## 2017-06-18 DIAGNOSIS — Z79899 Other long term (current) drug therapy: Secondary | ICD-10-CM | POA: Diagnosis not present

## 2017-06-18 DIAGNOSIS — M0589 Other rheumatoid arthritis with rheumatoid factor of multiple sites: Secondary | ICD-10-CM | POA: Diagnosis not present

## 2017-06-18 DIAGNOSIS — M549 Dorsalgia, unspecified: Secondary | ICD-10-CM | POA: Diagnosis not present

## 2017-06-19 DIAGNOSIS — M5416 Radiculopathy, lumbar region: Secondary | ICD-10-CM | POA: Diagnosis not present

## 2017-06-23 DIAGNOSIS — M9902 Segmental and somatic dysfunction of thoracic region: Secondary | ICD-10-CM | POA: Diagnosis not present

## 2017-06-23 DIAGNOSIS — M5441 Lumbago with sciatica, right side: Secondary | ICD-10-CM | POA: Diagnosis not present

## 2017-06-23 DIAGNOSIS — M9903 Segmental and somatic dysfunction of lumbar region: Secondary | ICD-10-CM | POA: Diagnosis not present

## 2017-06-23 DIAGNOSIS — M9905 Segmental and somatic dysfunction of pelvic region: Secondary | ICD-10-CM | POA: Diagnosis not present

## 2017-06-23 DIAGNOSIS — H903 Sensorineural hearing loss, bilateral: Secondary | ICD-10-CM | POA: Diagnosis not present

## 2017-06-24 DIAGNOSIS — M5416 Radiculopathy, lumbar region: Secondary | ICD-10-CM | POA: Diagnosis not present

## 2017-07-02 DIAGNOSIS — M79604 Pain in right leg: Secondary | ICD-10-CM | POA: Diagnosis not present

## 2017-07-02 DIAGNOSIS — M25561 Pain in right knee: Secondary | ICD-10-CM | POA: Diagnosis not present

## 2017-07-10 DIAGNOSIS — M5416 Radiculopathy, lumbar region: Secondary | ICD-10-CM | POA: Diagnosis not present

## 2017-07-10 DIAGNOSIS — M5441 Lumbago with sciatica, right side: Secondary | ICD-10-CM | POA: Diagnosis not present

## 2017-07-10 DIAGNOSIS — E1122 Type 2 diabetes mellitus with diabetic chronic kidney disease: Secondary | ICD-10-CM | POA: Diagnosis not present

## 2017-07-10 DIAGNOSIS — E1165 Type 2 diabetes mellitus with hyperglycemia: Secondary | ICD-10-CM | POA: Diagnosis not present

## 2017-07-28 DIAGNOSIS — Z96651 Presence of right artificial knee joint: Secondary | ICD-10-CM | POA: Diagnosis not present

## 2017-07-28 DIAGNOSIS — M1711 Unilateral primary osteoarthritis, right knee: Secondary | ICD-10-CM | POA: Diagnosis not present

## 2017-07-28 DIAGNOSIS — M25561 Pain in right knee: Secondary | ICD-10-CM | POA: Diagnosis not present

## 2017-08-12 DIAGNOSIS — E1122 Type 2 diabetes mellitus with diabetic chronic kidney disease: Secondary | ICD-10-CM | POA: Diagnosis not present

## 2017-08-12 DIAGNOSIS — E782 Mixed hyperlipidemia: Secondary | ICD-10-CM | POA: Diagnosis not present

## 2017-08-12 DIAGNOSIS — I10 Essential (primary) hypertension: Secondary | ICD-10-CM | POA: Diagnosis not present

## 2017-08-12 DIAGNOSIS — I129 Hypertensive chronic kidney disease with stage 1 through stage 4 chronic kidney disease, or unspecified chronic kidney disease: Secondary | ICD-10-CM | POA: Diagnosis not present

## 2017-08-19 DIAGNOSIS — E1122 Type 2 diabetes mellitus with diabetic chronic kidney disease: Secondary | ICD-10-CM | POA: Diagnosis not present

## 2017-08-19 DIAGNOSIS — E1165 Type 2 diabetes mellitus with hyperglycemia: Secondary | ICD-10-CM | POA: Diagnosis not present

## 2017-09-08 DIAGNOSIS — E531 Pyridoxine deficiency: Secondary | ICD-10-CM | POA: Diagnosis not present

## 2017-09-08 DIAGNOSIS — M2550 Pain in unspecified joint: Secondary | ICD-10-CM | POA: Diagnosis not present

## 2017-09-08 DIAGNOSIS — D8989 Other specified disorders involving the immune mechanism, not elsewhere classified: Secondary | ICD-10-CM | POA: Diagnosis not present

## 2017-09-08 DIAGNOSIS — E559 Vitamin D deficiency, unspecified: Secondary | ICD-10-CM | POA: Diagnosis not present

## 2017-09-08 DIAGNOSIS — N429 Disorder of prostate, unspecified: Secondary | ICD-10-CM | POA: Diagnosis not present

## 2017-09-08 DIAGNOSIS — Z79899 Other long term (current) drug therapy: Secondary | ICD-10-CM | POA: Diagnosis not present

## 2017-09-08 DIAGNOSIS — G479 Sleep disorder, unspecified: Secondary | ICD-10-CM | POA: Diagnosis not present

## 2017-09-08 DIAGNOSIS — Z13820 Encounter for screening for osteoporosis: Secondary | ICD-10-CM | POA: Diagnosis not present

## 2017-09-08 DIAGNOSIS — M170 Bilateral primary osteoarthritis of knee: Secondary | ICD-10-CM | POA: Diagnosis not present

## 2017-09-08 DIAGNOSIS — M19039 Primary osteoarthritis, unspecified wrist: Secondary | ICD-10-CM | POA: Diagnosis not present

## 2017-09-08 DIAGNOSIS — M0579 Rheumatoid arthritis with rheumatoid factor of multiple sites without organ or systems involvement: Secondary | ICD-10-CM | POA: Diagnosis not present

## 2017-09-08 DIAGNOSIS — E539 Vitamin B deficiency, unspecified: Secondary | ICD-10-CM | POA: Diagnosis not present

## 2017-09-08 DIAGNOSIS — E612 Magnesium deficiency: Secondary | ICD-10-CM | POA: Diagnosis not present

## 2017-09-23 DIAGNOSIS — G479 Sleep disorder, unspecified: Secondary | ICD-10-CM | POA: Diagnosis not present

## 2017-09-23 DIAGNOSIS — M19039 Primary osteoarthritis, unspecified wrist: Secondary | ICD-10-CM | POA: Diagnosis not present

## 2017-09-23 DIAGNOSIS — M1712 Unilateral primary osteoarthritis, left knee: Secondary | ICD-10-CM | POA: Diagnosis not present

## 2017-09-23 DIAGNOSIS — M170 Bilateral primary osteoarthritis of knee: Secondary | ICD-10-CM | POA: Diagnosis not present

## 2017-09-23 DIAGNOSIS — Z13820 Encounter for screening for osteoporosis: Secondary | ICD-10-CM | POA: Diagnosis not present

## 2017-09-23 DIAGNOSIS — M0579 Rheumatoid arthritis with rheumatoid factor of multiple sites without organ or systems involvement: Secondary | ICD-10-CM | POA: Diagnosis not present

## 2017-09-23 DIAGNOSIS — E539 Vitamin B deficiency, unspecified: Secondary | ICD-10-CM | POA: Diagnosis not present

## 2017-09-23 DIAGNOSIS — E612 Magnesium deficiency: Secondary | ICD-10-CM | POA: Diagnosis not present

## 2017-09-23 DIAGNOSIS — E559 Vitamin D deficiency, unspecified: Secondary | ICD-10-CM | POA: Diagnosis not present

## 2017-09-24 DIAGNOSIS — I10 Essential (primary) hypertension: Secondary | ICD-10-CM | POA: Diagnosis not present

## 2017-09-24 DIAGNOSIS — R05 Cough: Secondary | ICD-10-CM | POA: Diagnosis not present

## 2017-09-24 DIAGNOSIS — J069 Acute upper respiratory infection, unspecified: Secondary | ICD-10-CM | POA: Diagnosis not present

## 2017-10-03 DIAGNOSIS — R0989 Other specified symptoms and signs involving the circulatory and respiratory systems: Secondary | ICD-10-CM | POA: Diagnosis not present

## 2017-10-03 DIAGNOSIS — I10 Essential (primary) hypertension: Secondary | ICD-10-CM | POA: Diagnosis not present

## 2017-10-03 DIAGNOSIS — R05 Cough: Secondary | ICD-10-CM | POA: Diagnosis not present

## 2017-10-03 DIAGNOSIS — J45991 Cough variant asthma: Secondary | ICD-10-CM | POA: Diagnosis not present

## 2017-10-14 DIAGNOSIS — R350 Frequency of micturition: Secondary | ICD-10-CM | POA: Diagnosis not present

## 2017-10-14 DIAGNOSIS — N529 Male erectile dysfunction, unspecified: Secondary | ICD-10-CM | POA: Diagnosis not present

## 2017-10-14 DIAGNOSIS — Z125 Encounter for screening for malignant neoplasm of prostate: Secondary | ICD-10-CM | POA: Diagnosis not present

## 2017-10-14 DIAGNOSIS — N401 Enlarged prostate with lower urinary tract symptoms: Secondary | ICD-10-CM | POA: Diagnosis not present

## 2017-10-14 DIAGNOSIS — R351 Nocturia: Secondary | ICD-10-CM | POA: Diagnosis not present

## 2017-10-14 DIAGNOSIS — E291 Testicular hypofunction: Secondary | ICD-10-CM | POA: Diagnosis not present

## 2017-10-21 DIAGNOSIS — M170 Bilateral primary osteoarthritis of knee: Secondary | ICD-10-CM | POA: Diagnosis not present

## 2017-10-21 DIAGNOSIS — M1712 Unilateral primary osteoarthritis, left knee: Secondary | ICD-10-CM | POA: Diagnosis not present

## 2017-10-21 DIAGNOSIS — M19039 Primary osteoarthritis, unspecified wrist: Secondary | ICD-10-CM | POA: Diagnosis not present

## 2017-10-21 DIAGNOSIS — E559 Vitamin D deficiency, unspecified: Secondary | ICD-10-CM | POA: Diagnosis not present

## 2017-10-21 DIAGNOSIS — M0579 Rheumatoid arthritis with rheumatoid factor of multiple sites without organ or systems involvement: Secondary | ICD-10-CM | POA: Diagnosis not present

## 2017-10-21 DIAGNOSIS — E612 Magnesium deficiency: Secondary | ICD-10-CM | POA: Diagnosis not present

## 2017-10-21 DIAGNOSIS — G479 Sleep disorder, unspecified: Secondary | ICD-10-CM | POA: Diagnosis not present

## 2017-10-21 DIAGNOSIS — Z13820 Encounter for screening for osteoporosis: Secondary | ICD-10-CM | POA: Diagnosis not present

## 2017-11-24 DIAGNOSIS — J320 Chronic maxillary sinusitis: Secondary | ICD-10-CM | POA: Diagnosis not present

## 2017-12-22 DIAGNOSIS — E1165 Type 2 diabetes mellitus with hyperglycemia: Secondary | ICD-10-CM | POA: Diagnosis not present

## 2017-12-22 DIAGNOSIS — E1122 Type 2 diabetes mellitus with diabetic chronic kidney disease: Secondary | ICD-10-CM | POA: Diagnosis not present

## 2017-12-24 DIAGNOSIS — L02229 Furuncle of trunk, unspecified: Secondary | ICD-10-CM | POA: Diagnosis not present

## 2017-12-24 DIAGNOSIS — B9689 Other specified bacterial agents as the cause of diseases classified elsewhere: Secondary | ICD-10-CM | POA: Diagnosis not present

## 2017-12-24 DIAGNOSIS — L700 Acne vulgaris: Secondary | ICD-10-CM | POA: Diagnosis not present

## 2017-12-25 DIAGNOSIS — I129 Hypertensive chronic kidney disease with stage 1 through stage 4 chronic kidney disease, or unspecified chronic kidney disease: Secondary | ICD-10-CM | POA: Diagnosis not present

## 2017-12-25 DIAGNOSIS — E782 Mixed hyperlipidemia: Secondary | ICD-10-CM | POA: Diagnosis not present

## 2017-12-25 DIAGNOSIS — E1121 Type 2 diabetes mellitus with diabetic nephropathy: Secondary | ICD-10-CM | POA: Diagnosis not present

## 2017-12-25 DIAGNOSIS — E1122 Type 2 diabetes mellitus with diabetic chronic kidney disease: Secondary | ICD-10-CM | POA: Diagnosis not present

## 2017-12-26 DIAGNOSIS — M79641 Pain in right hand: Secondary | ICD-10-CM | POA: Diagnosis not present

## 2017-12-26 DIAGNOSIS — M542 Cervicalgia: Secondary | ICD-10-CM | POA: Diagnosis not present

## 2017-12-26 DIAGNOSIS — Z79899 Other long term (current) drug therapy: Secondary | ICD-10-CM | POA: Diagnosis not present

## 2017-12-26 DIAGNOSIS — M79601 Pain in right arm: Secondary | ICD-10-CM | POA: Diagnosis not present

## 2017-12-26 DIAGNOSIS — M0589 Other rheumatoid arthritis with rheumatoid factor of multiple sites: Secondary | ICD-10-CM | POA: Diagnosis not present

## 2017-12-26 DIAGNOSIS — M549 Dorsalgia, unspecified: Secondary | ICD-10-CM | POA: Diagnosis not present

## 2018-01-26 DIAGNOSIS — M0579 Rheumatoid arthritis with rheumatoid factor of multiple sites without organ or systems involvement: Secondary | ICD-10-CM | POA: Diagnosis not present

## 2018-01-26 DIAGNOSIS — E785 Hyperlipidemia, unspecified: Secondary | ICD-10-CM | POA: Diagnosis not present

## 2018-01-26 DIAGNOSIS — R7309 Other abnormal glucose: Secondary | ICD-10-CM | POA: Diagnosis not present

## 2018-01-26 DIAGNOSIS — Z13820 Encounter for screening for osteoporosis: Secondary | ICD-10-CM | POA: Diagnosis not present

## 2018-01-26 DIAGNOSIS — Z79899 Other long term (current) drug therapy: Secondary | ICD-10-CM | POA: Diagnosis not present

## 2018-01-26 DIAGNOSIS — E559 Vitamin D deficiency, unspecified: Secondary | ICD-10-CM | POA: Diagnosis not present

## 2018-01-26 DIAGNOSIS — M2550 Pain in unspecified joint: Secondary | ICD-10-CM | POA: Diagnosis not present

## 2018-01-26 DIAGNOSIS — M1712 Unilateral primary osteoarthritis, left knee: Secondary | ICD-10-CM | POA: Diagnosis not present

## 2018-01-26 DIAGNOSIS — E531 Pyridoxine deficiency: Secondary | ICD-10-CM | POA: Diagnosis not present

## 2018-01-26 DIAGNOSIS — M170 Bilateral primary osteoarthritis of knee: Secondary | ICD-10-CM | POA: Diagnosis not present

## 2018-01-26 DIAGNOSIS — E612 Magnesium deficiency: Secondary | ICD-10-CM | POA: Diagnosis not present

## 2018-01-26 DIAGNOSIS — M19039 Primary osteoarthritis, unspecified wrist: Secondary | ICD-10-CM | POA: Diagnosis not present

## 2018-01-26 DIAGNOSIS — G479 Sleep disorder, unspecified: Secondary | ICD-10-CM | POA: Diagnosis not present

## 2018-03-05 DIAGNOSIS — Z125 Encounter for screening for malignant neoplasm of prostate: Secondary | ICD-10-CM | POA: Diagnosis not present

## 2018-03-05 DIAGNOSIS — N401 Enlarged prostate with lower urinary tract symptoms: Secondary | ICD-10-CM | POA: Diagnosis not present

## 2018-03-05 DIAGNOSIS — R350 Frequency of micturition: Secondary | ICD-10-CM | POA: Diagnosis not present

## 2018-03-05 DIAGNOSIS — R7309 Other abnormal glucose: Secondary | ICD-10-CM | POA: Diagnosis not present

## 2018-03-05 DIAGNOSIS — I10 Essential (primary) hypertension: Secondary | ICD-10-CM | POA: Diagnosis not present

## 2018-03-05 DIAGNOSIS — R351 Nocturia: Secondary | ICD-10-CM | POA: Diagnosis not present

## 2018-03-05 DIAGNOSIS — N500 Atrophy of testis: Secondary | ICD-10-CM | POA: Diagnosis not present

## 2018-03-05 DIAGNOSIS — E291 Testicular hypofunction: Secondary | ICD-10-CM | POA: Diagnosis not present

## 2018-03-05 DIAGNOSIS — N529 Male erectile dysfunction, unspecified: Secondary | ICD-10-CM | POA: Diagnosis not present

## 2018-04-30 DIAGNOSIS — E1122 Type 2 diabetes mellitus with diabetic chronic kidney disease: Secondary | ICD-10-CM | POA: Diagnosis not present

## 2018-04-30 DIAGNOSIS — E782 Mixed hyperlipidemia: Secondary | ICD-10-CM | POA: Diagnosis not present

## 2018-04-30 DIAGNOSIS — I129 Hypertensive chronic kidney disease with stage 1 through stage 4 chronic kidney disease, or unspecified chronic kidney disease: Secondary | ICD-10-CM | POA: Diagnosis not present

## 2018-04-30 DIAGNOSIS — E1121 Type 2 diabetes mellitus with diabetic nephropathy: Secondary | ICD-10-CM | POA: Diagnosis not present

## 2018-04-30 DIAGNOSIS — M79601 Pain in right arm: Secondary | ICD-10-CM | POA: Diagnosis not present

## 2018-04-30 DIAGNOSIS — M79641 Pain in right hand: Secondary | ICD-10-CM | POA: Diagnosis not present

## 2018-04-30 DIAGNOSIS — M549 Dorsalgia, unspecified: Secondary | ICD-10-CM | POA: Diagnosis not present

## 2018-04-30 DIAGNOSIS — M542 Cervicalgia: Secondary | ICD-10-CM | POA: Diagnosis not present

## 2018-04-30 DIAGNOSIS — M0589 Other rheumatoid arthritis with rheumatoid factor of multiple sites: Secondary | ICD-10-CM | POA: Diagnosis not present

## 2018-04-30 DIAGNOSIS — Z79899 Other long term (current) drug therapy: Secondary | ICD-10-CM | POA: Diagnosis not present

## 2018-05-05 DIAGNOSIS — E1121 Type 2 diabetes mellitus with diabetic nephropathy: Secondary | ICD-10-CM | POA: Diagnosis not present

## 2018-05-05 DIAGNOSIS — E1122 Type 2 diabetes mellitus with diabetic chronic kidney disease: Secondary | ICD-10-CM | POA: Diagnosis not present

## 2018-05-05 DIAGNOSIS — E782 Mixed hyperlipidemia: Secondary | ICD-10-CM | POA: Diagnosis not present

## 2018-05-05 DIAGNOSIS — I129 Hypertensive chronic kidney disease with stage 1 through stage 4 chronic kidney disease, or unspecified chronic kidney disease: Secondary | ICD-10-CM | POA: Diagnosis not present

## 2018-05-05 DIAGNOSIS — Z23 Encounter for immunization: Secondary | ICD-10-CM | POA: Diagnosis not present

## 2018-05-05 DIAGNOSIS — N183 Chronic kidney disease, stage 3 (moderate): Secondary | ICD-10-CM | POA: Diagnosis not present

## 2018-05-11 DIAGNOSIS — I129 Hypertensive chronic kidney disease with stage 1 through stage 4 chronic kidney disease, or unspecified chronic kidney disease: Secondary | ICD-10-CM | POA: Diagnosis not present

## 2018-05-11 DIAGNOSIS — E782 Mixed hyperlipidemia: Secondary | ICD-10-CM | POA: Diagnosis not present

## 2018-05-11 DIAGNOSIS — E1122 Type 2 diabetes mellitus with diabetic chronic kidney disease: Secondary | ICD-10-CM | POA: Diagnosis not present

## 2018-05-11 DIAGNOSIS — K219 Gastro-esophageal reflux disease without esophagitis: Secondary | ICD-10-CM | POA: Diagnosis not present

## 2018-05-11 DIAGNOSIS — K5900 Constipation, unspecified: Secondary | ICD-10-CM | POA: Diagnosis not present

## 2018-06-09 DIAGNOSIS — M0589 Other rheumatoid arthritis with rheumatoid factor of multiple sites: Secondary | ICD-10-CM | POA: Diagnosis not present

## 2018-06-09 DIAGNOSIS — M549 Dorsalgia, unspecified: Secondary | ICD-10-CM | POA: Diagnosis not present

## 2018-06-09 DIAGNOSIS — M542 Cervicalgia: Secondary | ICD-10-CM | POA: Diagnosis not present

## 2018-06-09 DIAGNOSIS — Z79899 Other long term (current) drug therapy: Secondary | ICD-10-CM | POA: Diagnosis not present

## 2018-06-09 DIAGNOSIS — M25531 Pain in right wrist: Secondary | ICD-10-CM | POA: Diagnosis not present

## 2018-06-16 DIAGNOSIS — G894 Chronic pain syndrome: Secondary | ICD-10-CM | POA: Diagnosis not present

## 2018-06-16 DIAGNOSIS — M069 Rheumatoid arthritis, unspecified: Secondary | ICD-10-CM | POA: Diagnosis not present

## 2018-06-16 DIAGNOSIS — Z79899 Other long term (current) drug therapy: Secondary | ICD-10-CM | POA: Diagnosis not present

## 2018-06-16 DIAGNOSIS — Z79891 Long term (current) use of opiate analgesic: Secondary | ICD-10-CM | POA: Diagnosis not present

## 2018-06-24 DIAGNOSIS — E1122 Type 2 diabetes mellitus with diabetic chronic kidney disease: Secondary | ICD-10-CM | POA: Diagnosis not present

## 2018-06-24 DIAGNOSIS — E782 Mixed hyperlipidemia: Secondary | ICD-10-CM | POA: Diagnosis not present

## 2018-06-24 DIAGNOSIS — I129 Hypertensive chronic kidney disease with stage 1 through stage 4 chronic kidney disease, or unspecified chronic kidney disease: Secondary | ICD-10-CM | POA: Diagnosis not present

## 2018-07-09 DIAGNOSIS — E782 Mixed hyperlipidemia: Secondary | ICD-10-CM | POA: Diagnosis not present

## 2018-07-09 DIAGNOSIS — I10 Essential (primary) hypertension: Secondary | ICD-10-CM | POA: Diagnosis not present

## 2018-07-09 DIAGNOSIS — M12531 Traumatic arthropathy, right wrist: Secondary | ICD-10-CM | POA: Diagnosis not present

## 2018-07-09 DIAGNOSIS — M069 Rheumatoid arthritis, unspecified: Secondary | ICD-10-CM | POA: Diagnosis not present

## 2018-07-09 DIAGNOSIS — M0589 Other rheumatoid arthritis with rheumatoid factor of multiple sites: Secondary | ICD-10-CM | POA: Diagnosis not present

## 2018-07-09 DIAGNOSIS — E119 Type 2 diabetes mellitus without complications: Secondary | ICD-10-CM | POA: Diagnosis not present

## 2018-07-09 DIAGNOSIS — M79601 Pain in right arm: Secondary | ICD-10-CM | POA: Diagnosis not present

## 2018-07-21 DIAGNOSIS — Z96659 Presence of unspecified artificial knee joint: Secondary | ICD-10-CM | POA: Diagnosis not present

## 2018-07-21 DIAGNOSIS — M19139 Post-traumatic osteoarthritis, unspecified wrist: Secondary | ICD-10-CM | POA: Diagnosis not present

## 2018-07-21 DIAGNOSIS — M069 Rheumatoid arthritis, unspecified: Secondary | ICD-10-CM | POA: Diagnosis not present

## 2018-07-22 DIAGNOSIS — M25562 Pain in left knee: Secondary | ICD-10-CM | POA: Diagnosis not present

## 2018-07-22 DIAGNOSIS — M19031 Primary osteoarthritis, right wrist: Secondary | ICD-10-CM | POA: Diagnosis not present

## 2018-08-03 DIAGNOSIS — E782 Mixed hyperlipidemia: Secondary | ICD-10-CM | POA: Diagnosis not present

## 2018-08-03 DIAGNOSIS — I129 Hypertensive chronic kidney disease with stage 1 through stage 4 chronic kidney disease, or unspecified chronic kidney disease: Secondary | ICD-10-CM | POA: Diagnosis not present

## 2018-08-03 DIAGNOSIS — E1121 Type 2 diabetes mellitus with diabetic nephropathy: Secondary | ICD-10-CM | POA: Diagnosis not present

## 2018-08-03 DIAGNOSIS — E1122 Type 2 diabetes mellitus with diabetic chronic kidney disease: Secondary | ICD-10-CM | POA: Diagnosis not present

## 2018-08-04 DIAGNOSIS — E1122 Type 2 diabetes mellitus with diabetic chronic kidney disease: Secondary | ICD-10-CM | POA: Diagnosis not present

## 2018-08-04 DIAGNOSIS — Z0000 Encounter for general adult medical examination without abnormal findings: Secondary | ICD-10-CM | POA: Diagnosis not present

## 2018-08-04 DIAGNOSIS — E782 Mixed hyperlipidemia: Secondary | ICD-10-CM | POA: Diagnosis not present

## 2018-08-04 DIAGNOSIS — M069 Rheumatoid arthritis, unspecified: Secondary | ICD-10-CM | POA: Diagnosis not present

## 2018-08-04 DIAGNOSIS — J301 Allergic rhinitis due to pollen: Secondary | ICD-10-CM | POA: Diagnosis not present

## 2018-08-04 DIAGNOSIS — M12531 Traumatic arthropathy, right wrist: Secondary | ICD-10-CM | POA: Diagnosis not present

## 2018-08-04 DIAGNOSIS — E1121 Type 2 diabetes mellitus with diabetic nephropathy: Secondary | ICD-10-CM | POA: Diagnosis not present

## 2018-08-04 DIAGNOSIS — N183 Chronic kidney disease, stage 3 (moderate): Secondary | ICD-10-CM | POA: Diagnosis not present

## 2018-08-04 DIAGNOSIS — M150 Primary generalized (osteo)arthritis: Secondary | ICD-10-CM | POA: Diagnosis not present

## 2018-08-04 DIAGNOSIS — Z23 Encounter for immunization: Secondary | ICD-10-CM | POA: Diagnosis not present

## 2018-08-04 DIAGNOSIS — I129 Hypertensive chronic kidney disease with stage 1 through stage 4 chronic kidney disease, or unspecified chronic kidney disease: Secondary | ICD-10-CM | POA: Diagnosis not present

## 2018-08-06 DIAGNOSIS — I10 Essential (primary) hypertension: Secondary | ICD-10-CM | POA: Diagnosis not present

## 2018-08-06 DIAGNOSIS — E1122 Type 2 diabetes mellitus with diabetic chronic kidney disease: Secondary | ICD-10-CM | POA: Diagnosis not present

## 2018-08-06 DIAGNOSIS — E782 Mixed hyperlipidemia: Secondary | ICD-10-CM | POA: Diagnosis not present

## 2018-08-17 DIAGNOSIS — M25531 Pain in right wrist: Secondary | ICD-10-CM | POA: Diagnosis not present

## 2018-08-18 DIAGNOSIS — M05842 Other rheumatoid arthritis with rheumatoid factor of left hand: Secondary | ICD-10-CM | POA: Diagnosis not present

## 2018-08-18 DIAGNOSIS — M05841 Other rheumatoid arthritis with rheumatoid factor of right hand: Secondary | ICD-10-CM | POA: Diagnosis not present

## 2018-09-15 DIAGNOSIS — M25531 Pain in right wrist: Secondary | ICD-10-CM | POA: Diagnosis not present

## 2018-09-15 DIAGNOSIS — Z79899 Other long term (current) drug therapy: Secondary | ICD-10-CM | POA: Diagnosis not present

## 2018-09-15 DIAGNOSIS — I129 Hypertensive chronic kidney disease with stage 1 through stage 4 chronic kidney disease, or unspecified chronic kidney disease: Secondary | ICD-10-CM | POA: Diagnosis not present

## 2018-09-15 DIAGNOSIS — M549 Dorsalgia, unspecified: Secondary | ICD-10-CM | POA: Diagnosis not present

## 2018-09-15 DIAGNOSIS — M542 Cervicalgia: Secondary | ICD-10-CM | POA: Diagnosis not present

## 2018-09-15 DIAGNOSIS — M0589 Other rheumatoid arthritis with rheumatoid factor of multiple sites: Secondary | ICD-10-CM | POA: Diagnosis not present

## 2018-09-16 DIAGNOSIS — E1122 Type 2 diabetes mellitus with diabetic chronic kidney disease: Secondary | ICD-10-CM | POA: Diagnosis not present

## 2018-09-16 DIAGNOSIS — E782 Mixed hyperlipidemia: Secondary | ICD-10-CM | POA: Diagnosis not present

## 2018-09-16 DIAGNOSIS — I10 Essential (primary) hypertension: Secondary | ICD-10-CM | POA: Diagnosis not present

## 2018-10-29 DIAGNOSIS — G5621 Lesion of ulnar nerve, right upper limb: Secondary | ICD-10-CM | POA: Diagnosis not present

## 2018-10-29 DIAGNOSIS — M05842 Other rheumatoid arthritis with rheumatoid factor of left hand: Secondary | ICD-10-CM | POA: Diagnosis not present

## 2018-10-29 DIAGNOSIS — M05841 Other rheumatoid arthritis with rheumatoid factor of right hand: Secondary | ICD-10-CM | POA: Diagnosis not present

## 2018-11-09 DIAGNOSIS — G5621 Lesion of ulnar nerve, right upper limb: Secondary | ICD-10-CM | POA: Diagnosis not present

## 2018-11-10 DIAGNOSIS — E782 Mixed hyperlipidemia: Secondary | ICD-10-CM | POA: Diagnosis not present

## 2018-11-10 DIAGNOSIS — E1122 Type 2 diabetes mellitus with diabetic chronic kidney disease: Secondary | ICD-10-CM | POA: Diagnosis not present

## 2018-11-10 DIAGNOSIS — I10 Essential (primary) hypertension: Secondary | ICD-10-CM | POA: Diagnosis not present

## 2018-11-10 DIAGNOSIS — R300 Dysuria: Secondary | ICD-10-CM | POA: Diagnosis not present

## 2018-11-12 DIAGNOSIS — M05842 Other rheumatoid arthritis with rheumatoid factor of left hand: Secondary | ICD-10-CM | POA: Diagnosis not present

## 2018-11-12 DIAGNOSIS — I10 Essential (primary) hypertension: Secondary | ICD-10-CM | POA: Diagnosis not present

## 2018-11-12 DIAGNOSIS — E782 Mixed hyperlipidemia: Secondary | ICD-10-CM | POA: Diagnosis not present

## 2018-11-12 DIAGNOSIS — M05841 Other rheumatoid arthritis with rheumatoid factor of right hand: Secondary | ICD-10-CM | POA: Diagnosis not present

## 2018-11-12 DIAGNOSIS — E1122 Type 2 diabetes mellitus with diabetic chronic kidney disease: Secondary | ICD-10-CM | POA: Diagnosis not present

## 2018-11-12 DIAGNOSIS — M25531 Pain in right wrist: Secondary | ICD-10-CM | POA: Diagnosis not present

## 2018-11-12 DIAGNOSIS — R300 Dysuria: Secondary | ICD-10-CM | POA: Diagnosis not present

## 2018-11-12 DIAGNOSIS — G5621 Lesion of ulnar nerve, right upper limb: Secondary | ICD-10-CM | POA: Diagnosis not present

## 2018-11-16 DIAGNOSIS — Z79899 Other long term (current) drug therapy: Secondary | ICD-10-CM | POA: Diagnosis not present

## 2018-11-16 DIAGNOSIS — M542 Cervicalgia: Secondary | ICD-10-CM | POA: Diagnosis not present

## 2018-11-16 DIAGNOSIS — M25531 Pain in right wrist: Secondary | ICD-10-CM | POA: Diagnosis not present

## 2018-11-16 DIAGNOSIS — M549 Dorsalgia, unspecified: Secondary | ICD-10-CM | POA: Diagnosis not present

## 2018-11-16 DIAGNOSIS — M0589 Other rheumatoid arthritis with rheumatoid factor of multiple sites: Secondary | ICD-10-CM | POA: Diagnosis not present

## 2018-11-18 DIAGNOSIS — M25531 Pain in right wrist: Secondary | ICD-10-CM | POA: Diagnosis not present

## 2018-11-19 DIAGNOSIS — I129 Hypertensive chronic kidney disease with stage 1 through stage 4 chronic kidney disease, or unspecified chronic kidney disease: Secondary | ICD-10-CM | POA: Diagnosis not present

## 2018-11-19 DIAGNOSIS — I10 Essential (primary) hypertension: Secondary | ICD-10-CM | POA: Diagnosis not present

## 2018-11-24 DIAGNOSIS — M05842 Other rheumatoid arthritis with rheumatoid factor of left hand: Secondary | ICD-10-CM | POA: Diagnosis not present

## 2018-11-24 DIAGNOSIS — M05841 Other rheumatoid arthritis with rheumatoid factor of right hand: Secondary | ICD-10-CM | POA: Diagnosis not present

## 2018-12-09 DIAGNOSIS — N35919 Unspecified urethral stricture, male, unspecified site: Secondary | ICD-10-CM | POA: Diagnosis not present

## 2018-12-09 DIAGNOSIS — N500 Atrophy of testis: Secondary | ICD-10-CM | POA: Diagnosis not present

## 2018-12-09 DIAGNOSIS — N529 Male erectile dysfunction, unspecified: Secondary | ICD-10-CM | POA: Diagnosis not present

## 2018-12-09 DIAGNOSIS — R351 Nocturia: Secondary | ICD-10-CM | POA: Diagnosis not present

## 2018-12-09 DIAGNOSIS — E291 Testicular hypofunction: Secondary | ICD-10-CM | POA: Diagnosis not present

## 2018-12-09 DIAGNOSIS — R7309 Other abnormal glucose: Secondary | ICD-10-CM | POA: Diagnosis not present

## 2018-12-09 DIAGNOSIS — N401 Enlarged prostate with lower urinary tract symptoms: Secondary | ICD-10-CM | POA: Diagnosis not present

## 2018-12-09 DIAGNOSIS — R350 Frequency of micturition: Secondary | ICD-10-CM | POA: Diagnosis not present

## 2018-12-09 DIAGNOSIS — R3915 Urgency of urination: Secondary | ICD-10-CM | POA: Diagnosis not present

## 2018-12-09 DIAGNOSIS — Z125 Encounter for screening for malignant neoplasm of prostate: Secondary | ICD-10-CM | POA: Diagnosis not present

## 2018-12-15 DIAGNOSIS — N183 Chronic kidney disease, stage 3 (moderate): Secondary | ICD-10-CM | POA: Diagnosis not present

## 2018-12-15 DIAGNOSIS — R358 Other polyuria: Secondary | ICD-10-CM | POA: Diagnosis not present

## 2018-12-15 DIAGNOSIS — Z7189 Other specified counseling: Secondary | ICD-10-CM | POA: Diagnosis not present

## 2018-12-15 DIAGNOSIS — E1122 Type 2 diabetes mellitus with diabetic chronic kidney disease: Secondary | ICD-10-CM | POA: Diagnosis not present

## 2018-12-15 DIAGNOSIS — E1121 Type 2 diabetes mellitus with diabetic nephropathy: Secondary | ICD-10-CM | POA: Diagnosis not present

## 2018-12-21 DIAGNOSIS — M05841 Other rheumatoid arthritis with rheumatoid factor of right hand: Secondary | ICD-10-CM | POA: Diagnosis not present

## 2018-12-21 DIAGNOSIS — M069 Rheumatoid arthritis, unspecified: Secondary | ICD-10-CM | POA: Diagnosis not present

## 2018-12-25 DIAGNOSIS — M25571 Pain in right ankle and joints of right foot: Secondary | ICD-10-CM | POA: Diagnosis not present

## 2018-12-25 DIAGNOSIS — M79671 Pain in right foot: Secondary | ICD-10-CM | POA: Diagnosis not present

## 2019-01-05 DIAGNOSIS — M05841 Other rheumatoid arthritis with rheumatoid factor of right hand: Secondary | ICD-10-CM | POA: Diagnosis not present

## 2019-01-05 DIAGNOSIS — Z4789 Encounter for other orthopedic aftercare: Secondary | ICD-10-CM | POA: Diagnosis not present

## 2019-01-07 DIAGNOSIS — M542 Cervicalgia: Secondary | ICD-10-CM | POA: Diagnosis not present

## 2019-01-07 DIAGNOSIS — Z79899 Other long term (current) drug therapy: Secondary | ICD-10-CM | POA: Diagnosis not present

## 2019-01-07 DIAGNOSIS — M0589 Other rheumatoid arthritis with rheumatoid factor of multiple sites: Secondary | ICD-10-CM | POA: Diagnosis not present

## 2019-01-07 DIAGNOSIS — M549 Dorsalgia, unspecified: Secondary | ICD-10-CM | POA: Diagnosis not present

## 2019-01-07 DIAGNOSIS — M25531 Pain in right wrist: Secondary | ICD-10-CM | POA: Diagnosis not present

## 2019-01-07 DIAGNOSIS — M25569 Pain in unspecified knee: Secondary | ICD-10-CM | POA: Diagnosis not present

## 2019-01-11 DIAGNOSIS — E782 Mixed hyperlipidemia: Secondary | ICD-10-CM | POA: Diagnosis not present

## 2019-01-11 DIAGNOSIS — I129 Hypertensive chronic kidney disease with stage 1 through stage 4 chronic kidney disease, or unspecified chronic kidney disease: Secondary | ICD-10-CM | POA: Diagnosis not present

## 2019-01-11 DIAGNOSIS — E1122 Type 2 diabetes mellitus with diabetic chronic kidney disease: Secondary | ICD-10-CM | POA: Diagnosis not present

## 2019-01-13 DIAGNOSIS — D631 Anemia in chronic kidney disease: Secondary | ICD-10-CM | POA: Diagnosis not present

## 2019-01-13 DIAGNOSIS — N183 Chronic kidney disease, stage 3 (moderate): Secondary | ICD-10-CM | POA: Diagnosis not present

## 2019-01-13 DIAGNOSIS — N189 Chronic kidney disease, unspecified: Secondary | ICD-10-CM | POA: Diagnosis not present

## 2019-01-13 DIAGNOSIS — I129 Hypertensive chronic kidney disease with stage 1 through stage 4 chronic kidney disease, or unspecified chronic kidney disease: Secondary | ICD-10-CM | POA: Diagnosis not present

## 2019-01-13 DIAGNOSIS — N2581 Secondary hyperparathyroidism of renal origin: Secondary | ICD-10-CM | POA: Diagnosis not present

## 2019-01-14 DIAGNOSIS — M0589 Other rheumatoid arthritis with rheumatoid factor of multiple sites: Secondary | ICD-10-CM | POA: Diagnosis not present

## 2019-01-21 DIAGNOSIS — E1122 Type 2 diabetes mellitus with diabetic chronic kidney disease: Secondary | ICD-10-CM | POA: Diagnosis not present

## 2019-01-21 DIAGNOSIS — R358 Other polyuria: Secondary | ICD-10-CM | POA: Diagnosis not present

## 2019-01-21 DIAGNOSIS — E1121 Type 2 diabetes mellitus with diabetic nephropathy: Secondary | ICD-10-CM | POA: Diagnosis not present

## 2019-01-21 DIAGNOSIS — I10 Essential (primary) hypertension: Secondary | ICD-10-CM | POA: Diagnosis not present

## 2019-01-28 DIAGNOSIS — E1121 Type 2 diabetes mellitus with diabetic nephropathy: Secondary | ICD-10-CM | POA: Diagnosis not present

## 2019-01-28 DIAGNOSIS — I129 Hypertensive chronic kidney disease with stage 1 through stage 4 chronic kidney disease, or unspecified chronic kidney disease: Secondary | ICD-10-CM | POA: Diagnosis not present

## 2019-01-28 DIAGNOSIS — E782 Mixed hyperlipidemia: Secondary | ICD-10-CM | POA: Diagnosis not present

## 2019-01-28 DIAGNOSIS — N183 Chronic kidney disease, stage 3 (moderate): Secondary | ICD-10-CM | POA: Diagnosis not present

## 2019-01-28 DIAGNOSIS — Z7189 Other specified counseling: Secondary | ICD-10-CM | POA: Diagnosis not present

## 2019-01-29 DIAGNOSIS — Z79899 Other long term (current) drug therapy: Secondary | ICD-10-CM | POA: Diagnosis not present

## 2019-01-29 DIAGNOSIS — E782 Mixed hyperlipidemia: Secondary | ICD-10-CM | POA: Diagnosis not present

## 2019-01-29 DIAGNOSIS — I10 Essential (primary) hypertension: Secondary | ICD-10-CM | POA: Diagnosis not present

## 2019-01-29 DIAGNOSIS — M0589 Other rheumatoid arthritis with rheumatoid factor of multiple sites: Secondary | ICD-10-CM | POA: Diagnosis not present

## 2019-02-03 DIAGNOSIS — Z86010 Personal history of colonic polyps: Secondary | ICD-10-CM | POA: Diagnosis not present

## 2019-02-03 DIAGNOSIS — R140 Abdominal distension (gaseous): Secondary | ICD-10-CM | POA: Diagnosis not present

## 2019-02-03 DIAGNOSIS — K5900 Constipation, unspecified: Secondary | ICD-10-CM | POA: Diagnosis not present

## 2019-02-04 DIAGNOSIS — M05842 Other rheumatoid arthritis with rheumatoid factor of left hand: Secondary | ICD-10-CM | POA: Diagnosis not present

## 2019-02-04 DIAGNOSIS — M79641 Pain in right hand: Secondary | ICD-10-CM | POA: Diagnosis not present

## 2019-02-04 DIAGNOSIS — M05841 Other rheumatoid arthritis with rheumatoid factor of right hand: Secondary | ICD-10-CM | POA: Diagnosis not present

## 2019-02-04 DIAGNOSIS — Z4789 Encounter for other orthopedic aftercare: Secondary | ICD-10-CM | POA: Diagnosis not present

## 2019-02-08 DIAGNOSIS — D72829 Elevated white blood cell count, unspecified: Secondary | ICD-10-CM | POA: Diagnosis not present

## 2019-02-08 DIAGNOSIS — I129 Hypertensive chronic kidney disease with stage 1 through stage 4 chronic kidney disease, or unspecified chronic kidney disease: Secondary | ICD-10-CM | POA: Diagnosis not present

## 2019-02-08 DIAGNOSIS — Z23 Encounter for immunization: Secondary | ICD-10-CM | POA: Diagnosis not present

## 2019-02-15 DIAGNOSIS — M0589 Other rheumatoid arthritis with rheumatoid factor of multiple sites: Secondary | ICD-10-CM | POA: Diagnosis not present

## 2019-02-24 DIAGNOSIS — M25462 Effusion, left knee: Secondary | ICD-10-CM | POA: Diagnosis not present

## 2019-02-24 DIAGNOSIS — M069 Rheumatoid arthritis, unspecified: Secondary | ICD-10-CM | POA: Diagnosis not present

## 2019-03-02 DIAGNOSIS — L732 Hidradenitis suppurativa: Secondary | ICD-10-CM | POA: Diagnosis not present

## 2019-03-02 DIAGNOSIS — R3915 Urgency of urination: Secondary | ICD-10-CM | POA: Diagnosis not present

## 2019-03-02 DIAGNOSIS — N529 Male erectile dysfunction, unspecified: Secondary | ICD-10-CM | POA: Diagnosis not present

## 2019-03-02 DIAGNOSIS — Z125 Encounter for screening for malignant neoplasm of prostate: Secondary | ICD-10-CM | POA: Diagnosis not present

## 2019-03-02 DIAGNOSIS — R351 Nocturia: Secondary | ICD-10-CM | POA: Diagnosis not present

## 2019-03-02 DIAGNOSIS — N401 Enlarged prostate with lower urinary tract symptoms: Secondary | ICD-10-CM | POA: Diagnosis not present

## 2019-03-02 DIAGNOSIS — R7309 Other abnormal glucose: Secondary | ICD-10-CM | POA: Diagnosis not present

## 2019-03-02 DIAGNOSIS — E291 Testicular hypofunction: Secondary | ICD-10-CM | POA: Diagnosis not present

## 2019-03-02 DIAGNOSIS — R350 Frequency of micturition: Secondary | ICD-10-CM | POA: Diagnosis not present

## 2019-03-02 DIAGNOSIS — N500 Atrophy of testis: Secondary | ICD-10-CM | POA: Diagnosis not present

## 2019-03-02 DIAGNOSIS — N35919 Unspecified urethral stricture, male, unspecified site: Secondary | ICD-10-CM | POA: Diagnosis not present

## 2019-03-11 DIAGNOSIS — E291 Testicular hypofunction: Secondary | ICD-10-CM | POA: Diagnosis not present

## 2019-03-11 DIAGNOSIS — Z789 Other specified health status: Secondary | ICD-10-CM | POA: Diagnosis not present

## 2019-03-11 DIAGNOSIS — Z125 Encounter for screening for malignant neoplasm of prostate: Secondary | ICD-10-CM | POA: Diagnosis not present

## 2019-03-11 DIAGNOSIS — R972 Elevated prostate specific antigen [PSA]: Secondary | ICD-10-CM | POA: Diagnosis not present

## 2019-03-11 DIAGNOSIS — R3915 Urgency of urination: Secondary | ICD-10-CM | POA: Diagnosis not present

## 2019-03-11 DIAGNOSIS — R350 Frequency of micturition: Secondary | ICD-10-CM | POA: Diagnosis not present

## 2019-03-11 DIAGNOSIS — N529 Male erectile dysfunction, unspecified: Secondary | ICD-10-CM | POA: Diagnosis not present

## 2019-03-11 DIAGNOSIS — N4289 Other specified disorders of prostate: Secondary | ICD-10-CM | POA: Diagnosis not present

## 2019-03-11 DIAGNOSIS — R351 Nocturia: Secondary | ICD-10-CM | POA: Diagnosis not present

## 2019-03-11 DIAGNOSIS — N500 Atrophy of testis: Secondary | ICD-10-CM | POA: Diagnosis not present

## 2019-03-11 DIAGNOSIS — N401 Enlarged prostate with lower urinary tract symptoms: Secondary | ICD-10-CM | POA: Diagnosis not present

## 2019-03-11 DIAGNOSIS — N35919 Unspecified urethral stricture, male, unspecified site: Secondary | ICD-10-CM | POA: Diagnosis not present

## 2019-03-11 DIAGNOSIS — R7309 Other abnormal glucose: Secondary | ICD-10-CM | POA: Diagnosis not present

## 2019-03-16 DIAGNOSIS — Z7189 Other specified counseling: Secondary | ICD-10-CM | POA: Diagnosis not present

## 2019-03-16 DIAGNOSIS — I10 Essential (primary) hypertension: Secondary | ICD-10-CM | POA: Diagnosis not present

## 2019-03-16 DIAGNOSIS — I129 Hypertensive chronic kidney disease with stage 1 through stage 4 chronic kidney disease, or unspecified chronic kidney disease: Secondary | ICD-10-CM | POA: Diagnosis not present

## 2019-03-16 DIAGNOSIS — Z0000 Encounter for general adult medical examination without abnormal findings: Secondary | ICD-10-CM | POA: Diagnosis not present

## 2019-03-16 DIAGNOSIS — N183 Chronic kidney disease, stage 3 unspecified: Secondary | ICD-10-CM | POA: Diagnosis not present

## 2019-03-16 DIAGNOSIS — Z125 Encounter for screening for malignant neoplasm of prostate: Secondary | ICD-10-CM | POA: Diagnosis not present

## 2019-03-16 DIAGNOSIS — E1121 Type 2 diabetes mellitus with diabetic nephropathy: Secondary | ICD-10-CM | POA: Diagnosis not present

## 2019-03-16 DIAGNOSIS — E782 Mixed hyperlipidemia: Secondary | ICD-10-CM | POA: Diagnosis not present

## 2019-03-17 DIAGNOSIS — M0589 Other rheumatoid arthritis with rheumatoid factor of multiple sites: Secondary | ICD-10-CM | POA: Diagnosis not present

## 2019-03-17 DIAGNOSIS — M109 Gout, unspecified: Secondary | ICD-10-CM | POA: Diagnosis not present

## 2019-03-17 DIAGNOSIS — M25572 Pain in left ankle and joints of left foot: Secondary | ICD-10-CM | POA: Diagnosis not present

## 2019-03-17 DIAGNOSIS — M79673 Pain in unspecified foot: Secondary | ICD-10-CM | POA: Diagnosis not present

## 2019-03-17 DIAGNOSIS — M19072 Primary osteoarthritis, left ankle and foot: Secondary | ICD-10-CM | POA: Diagnosis not present

## 2019-03-17 DIAGNOSIS — M79671 Pain in right foot: Secondary | ICD-10-CM | POA: Diagnosis not present

## 2019-03-17 DIAGNOSIS — M79672 Pain in left foot: Secondary | ICD-10-CM | POA: Diagnosis not present

## 2019-03-17 DIAGNOSIS — N289 Disorder of kidney and ureter, unspecified: Secondary | ICD-10-CM | POA: Diagnosis not present

## 2019-03-17 DIAGNOSIS — M19071 Primary osteoarthritis, right ankle and foot: Secondary | ICD-10-CM | POA: Diagnosis not present

## 2019-03-17 DIAGNOSIS — M549 Dorsalgia, unspecified: Secondary | ICD-10-CM | POA: Diagnosis not present

## 2019-03-17 DIAGNOSIS — Z79899 Other long term (current) drug therapy: Secondary | ICD-10-CM | POA: Diagnosis not present

## 2019-03-18 DIAGNOSIS — Z4789 Encounter for other orthopedic aftercare: Secondary | ICD-10-CM | POA: Diagnosis not present

## 2019-03-19 DIAGNOSIS — M0589 Other rheumatoid arthritis with rheumatoid factor of multiple sites: Secondary | ICD-10-CM | POA: Diagnosis not present

## 2019-03-22 DIAGNOSIS — E782 Mixed hyperlipidemia: Secondary | ICD-10-CM | POA: Diagnosis not present

## 2019-03-22 DIAGNOSIS — I10 Essential (primary) hypertension: Secondary | ICD-10-CM | POA: Diagnosis not present

## 2019-03-22 DIAGNOSIS — D509 Iron deficiency anemia, unspecified: Secondary | ICD-10-CM | POA: Diagnosis not present

## 2019-03-22 DIAGNOSIS — E1122 Type 2 diabetes mellitus with diabetic chronic kidney disease: Secondary | ICD-10-CM | POA: Diagnosis not present

## 2019-03-22 DIAGNOSIS — E1165 Type 2 diabetes mellitus with hyperglycemia: Secondary | ICD-10-CM | POA: Diagnosis not present

## 2019-03-22 DIAGNOSIS — N183 Chronic kidney disease, stage 3 unspecified: Secondary | ICD-10-CM | POA: Diagnosis not present

## 2019-03-22 DIAGNOSIS — E1121 Type 2 diabetes mellitus with diabetic nephropathy: Secondary | ICD-10-CM | POA: Diagnosis not present

## 2019-03-22 DIAGNOSIS — D649 Anemia, unspecified: Secondary | ICD-10-CM | POA: Diagnosis not present

## 2019-03-22 DIAGNOSIS — I129 Hypertensive chronic kidney disease with stage 1 through stage 4 chronic kidney disease, or unspecified chronic kidney disease: Secondary | ICD-10-CM | POA: Diagnosis not present

## 2019-03-22 DIAGNOSIS — D72829 Elevated white blood cell count, unspecified: Secondary | ICD-10-CM | POA: Diagnosis not present

## 2019-03-22 DIAGNOSIS — Z7189 Other specified counseling: Secondary | ICD-10-CM | POA: Diagnosis not present

## 2019-03-22 DIAGNOSIS — L709 Acne, unspecified: Secondary | ICD-10-CM | POA: Diagnosis not present

## 2019-03-22 DIAGNOSIS — M0589 Other rheumatoid arthritis with rheumatoid factor of multiple sites: Secondary | ICD-10-CM | POA: Diagnosis not present

## 2019-03-30 DIAGNOSIS — K635 Polyp of colon: Secondary | ICD-10-CM | POA: Diagnosis not present

## 2019-03-30 DIAGNOSIS — K5730 Diverticulosis of large intestine without perforation or abscess without bleeding: Secondary | ICD-10-CM | POA: Diagnosis not present

## 2019-03-30 DIAGNOSIS — D123 Benign neoplasm of transverse colon: Secondary | ICD-10-CM | POA: Diagnosis not present

## 2019-03-30 DIAGNOSIS — Z1211 Encounter for screening for malignant neoplasm of colon: Secondary | ICD-10-CM | POA: Diagnosis not present

## 2019-03-30 DIAGNOSIS — Z86010 Personal history of colonic polyps: Secondary | ICD-10-CM | POA: Diagnosis not present

## 2019-03-30 DIAGNOSIS — D122 Benign neoplasm of ascending colon: Secondary | ICD-10-CM | POA: Diagnosis not present

## 2019-04-01 DIAGNOSIS — L738 Other specified follicular disorders: Secondary | ICD-10-CM | POA: Diagnosis not present

## 2019-04-01 DIAGNOSIS — L720 Epidermal cyst: Secondary | ICD-10-CM | POA: Diagnosis not present

## 2019-04-01 DIAGNOSIS — L700 Acne vulgaris: Secondary | ICD-10-CM | POA: Diagnosis not present

## 2019-04-06 DIAGNOSIS — E1121 Type 2 diabetes mellitus with diabetic nephropathy: Secondary | ICD-10-CM | POA: Diagnosis not present

## 2019-04-06 DIAGNOSIS — E1122 Type 2 diabetes mellitus with diabetic chronic kidney disease: Secondary | ICD-10-CM | POA: Diagnosis not present

## 2019-04-06 DIAGNOSIS — I129 Hypertensive chronic kidney disease with stage 1 through stage 4 chronic kidney disease, or unspecified chronic kidney disease: Secondary | ICD-10-CM | POA: Diagnosis not present

## 2019-04-06 DIAGNOSIS — D649 Anemia, unspecified: Secondary | ICD-10-CM | POA: Diagnosis not present

## 2019-04-06 DIAGNOSIS — E1165 Type 2 diabetes mellitus with hyperglycemia: Secondary | ICD-10-CM | POA: Diagnosis not present

## 2019-04-06 DIAGNOSIS — E782 Mixed hyperlipidemia: Secondary | ICD-10-CM | POA: Diagnosis not present

## 2019-04-19 DIAGNOSIS — M0589 Other rheumatoid arthritis with rheumatoid factor of multiple sites: Secondary | ICD-10-CM | POA: Diagnosis not present

## 2019-04-20 ENCOUNTER — Ambulatory Visit: Payer: Medicare Other | Admitting: Sports Medicine

## 2019-04-22 ENCOUNTER — Ambulatory Visit: Payer: Medicare Other | Admitting: Podiatry

## 2019-04-22 ENCOUNTER — Ambulatory Visit (INDEPENDENT_AMBULATORY_CARE_PROVIDER_SITE_OTHER): Payer: Medicare Other

## 2019-04-22 ENCOUNTER — Other Ambulatory Visit: Payer: Self-pay

## 2019-04-22 ENCOUNTER — Ambulatory Visit (INDEPENDENT_AMBULATORY_CARE_PROVIDER_SITE_OTHER): Payer: Medicare Other | Admitting: Podiatry

## 2019-04-22 DIAGNOSIS — M79672 Pain in left foot: Secondary | ICD-10-CM | POA: Diagnosis not present

## 2019-04-22 DIAGNOSIS — M779 Enthesopathy, unspecified: Secondary | ICD-10-CM

## 2019-04-22 DIAGNOSIS — M79671 Pain in right foot: Secondary | ICD-10-CM

## 2019-04-22 MED ORDER — DICLOFENAC SODIUM 1 % EX GEL: 2.0000 g | Freq: Four times a day (QID) | CUTANEOUS | 2 refills | Status: DC

## 2019-04-22 NOTE — Patient Instructions (Signed)

## 2019-04-23 ENCOUNTER — Other Ambulatory Visit: Payer: Self-pay | Admitting: Podiatry

## 2019-04-23 DIAGNOSIS — M779 Enthesopathy, unspecified: Secondary | ICD-10-CM

## 2019-04-23 NOTE — Progress Notes (Signed)
Subjective:   Patient ID: Kevin Lopez, male   DOB: 68 y.o.   MRN: [id number]   HPI 68 year old male presents the office with concerns of bilateral foot pain.  He states that he gets pain to the bottom of the midfoot.  He states that it goes over the medial aspect of the foot below the top of the foot that he describes.  He describes a burning pain as well as aching sensation.  He denies any recent injury or trauma.  He said no recent treatment.  The pain is intermittent and fluctuates over the left for the right foot.  It does not happen at the same time.  This is on the left 2.5 months.  No other concerns today.   Review of Systems  All other systems reviewed and are negative.  Past Medical History:  Diagnosis Date  . Diverticulosis   . Hypertension   . RA (rheumatoid arthritis) ([place])   . SBO (small bowel obstruction) s/p ex lap in past 12/15/2015    Past Surgical History:  Procedure Laterality Date  . APPENDECTOMY    . COLOSTOMY TAKEDOWN     [place] N/A 12/15/2015   Procedure: FLEXIBLE SIGMOIDOSCOPY;  Surgeon: Danie Binder, MD;  Location: [facility];  Service: Endoscopy;  Laterality: N/A;  . KNEE SURGERY    . LEFT COLECTOMY     [place]  . LYSIS OF ADHESION     SBO     Current Outpatient Medications:  .  Abatacept (ORENCIA) 125 MG/ML SOSY, Inject 125 mg into the skin once a week. On Thursday., Disp: , Rfl:  .  adapalene (DIFFERIN) 0.1 % cream, Apply 1 application topically daily as needed., Disp: , Rfl: 6 .  albuterol (PROVENTIL HFA;VENTOLIN HFA) 108 (90 Base) MCG/ACT inhaler, Inhale 2 puffs into the lungs every 6 (six) hours as needed for wheezing or shortness of breath., Disp: , Rfl:  .  amLODipine (NORVASC) 10 MG tablet, Take 10 mg by mouth daily., Disp: , Rfl:  .  amoxicillin-clavulanate (AUGMENTIN) 875-125 MG tablet, Take 1 tablet by mouth 2 (two) times daily., Disp: 14 tablet, Rfl: 0 .  clonazePAM (KLONOPIN) 0.5 MG tablet, Take 0.5 mg by mouth 2  (two) times daily as needed for anxiety. , Disp: , Rfl: 0 .  ketoconazole (NIZORAL) 2 % cream, Apply 1 application topically daily as needed for irritation. , Disp: , Rfl: 2 .  Multiple Vitamin (MULTIVITAMIN WITH MINERALS) TABS tablet, Take 1 tablet by mouth daily., Disp: , Rfl:  .  predniSONE (DELTASONE) 5 MG tablet, Take 1 tablet (5 mg total) by mouth daily with breakfast., Disp: 10 tablet, Rfl: 0 .  zolpidem (AMBIEN CR) 12.5 MG CR tablet, Take 12.5 mg by mouth at bedtime as needed for sleep., Disp: , Rfl:  .  diclofenac Sodium (VOLTAREN) 1 % GEL, Apply 2 g topically 4 (four) times daily. Rub into affected area of foot 2 to 4 times daily, Disp: 100 g, Rfl: 2  Allergies  Allergen Reactions  . Latex Shortness Of Breath          Objective:  Physical Exam  General: AAO x3, NAD  Dermatological: Skin is warm, dry and supple bilateral. Nails x 10 are well manicured; remaining integument appears unremarkable at this time. There are no open sores, no preulcerative lesions, no rash or signs of infection present.  Vascular: Dorsalis Pedis artery and Posterior Tibial artery pedal pulses are 2/4 bilateral with immedate capillary fill time.  Pedal hair growth present. No varicosities and no lower extremity edema present bilateral. There is no pain with calf compression, swelling, warmth, erythema.   Neruologic: Grossly intact via light touch bilateral. Vibratory intact via tuning fork bilateral. Protective threshold with Semmes Wienstein monofilament intact to all pedal sites bilateral.  Negative Tinel sign.  Musculoskeletal: On weightbearing exam his arches appear to be rectus.  Nonweightbearing exam reveals of the ankle, subtalar range of motion intact.  The majority of tenderness seems to be upon course the posterior tibial tendon mostly on the left side but there is tenderness on the navicular tuberosity bilaterally.  No significant edema there is no erythema or warmth.  Posterior tibial, flexor  tendons appear to be intact.  There is also some discomfort on the dorsal lateral aspect midfoot bilaterally also in the fourth/fifth metatarsal cuboid joint area.  No specific area of pinpoint tenderness.  Muscular strength 5/5 in all groups tested bilateral.  Upon evaluation of his shoes he is wearing a sneaker without any support insert a flexible in the arch area.  These of the shoes that he typically wears.  Gait: Unassisted, Nonantalgic.     Assessment:   68 year old male with bilateral foot pain, tendinitis/capsulitis    Plan:  -Treatment options discussed including all alternatives, risks, and complications -Etiology of symptoms were discussed -X-rays were obtained and reviewed with the patient.  -We discussed changing shoes for stiffness to be helpful for him.  I dispensed power steps.  We discussed stretching, rehab exercises.  Discussed ice, heat contrast. Voltaren gel ordered.  Trula Slade DPM

## 2019-04-27 ENCOUNTER — Ambulatory Visit: Payer: Medicare Other | Admitting: Podiatry

## 2019-04-27 DIAGNOSIS — N183 Chronic kidney disease, stage 3 unspecified: Secondary | ICD-10-CM | POA: Diagnosis not present

## 2019-04-27 DIAGNOSIS — N2581 Secondary hyperparathyroidism of renal origin: Secondary | ICD-10-CM | POA: Diagnosis not present

## 2019-05-12 ENCOUNTER — Other Ambulatory Visit: Payer: Self-pay

## 2019-05-12 ENCOUNTER — Encounter (HOSPITAL_COMMUNITY): Payer: Self-pay | Admitting: *Deleted

## 2019-05-12 ENCOUNTER — Inpatient Hospital Stay (HOSPITAL_COMMUNITY): Payer: Medicare Other | Attending: Hematology | Admitting: Hematology

## 2019-05-12 ENCOUNTER — Inpatient Hospital Stay (HOSPITAL_COMMUNITY): Payer: Medicare Other

## 2019-05-12 ENCOUNTER — Encounter (HOSPITAL_COMMUNITY): Payer: Self-pay | Admitting: Hematology

## 2019-05-12 VITALS — BP 159/83 | HR 81 | Temp 97.3°F | Resp 18 | Ht 68.25 in | Wt 169.4 lb

## 2019-05-12 DIAGNOSIS — E1122 Type 2 diabetes mellitus with diabetic chronic kidney disease: Secondary | ICD-10-CM | POA: Diagnosis not present

## 2019-05-12 DIAGNOSIS — I129 Hypertensive chronic kidney disease with stage 1 through stage 4 chronic kidney disease, or unspecified chronic kidney disease: Secondary | ICD-10-CM | POA: Insufficient documentation

## 2019-05-12 DIAGNOSIS — M2550 Pain in unspecified joint: Secondary | ICD-10-CM | POA: Diagnosis not present

## 2019-05-12 DIAGNOSIS — Z8249 Family history of ischemic heart disease and other diseases of the circulatory system: Secondary | ICD-10-CM | POA: Insufficient documentation

## 2019-05-12 DIAGNOSIS — D473 Essential (hemorrhagic) thrombocythemia: Secondary | ICD-10-CM | POA: Insufficient documentation

## 2019-05-12 DIAGNOSIS — Z809 Family history of malignant neoplasm, unspecified: Secondary | ICD-10-CM | POA: Insufficient documentation

## 2019-05-12 DIAGNOSIS — N183 Chronic kidney disease, stage 3 unspecified: Secondary | ICD-10-CM | POA: Diagnosis not present

## 2019-05-12 DIAGNOSIS — M791 Myalgia, unspecified site: Secondary | ICD-10-CM | POA: Insufficient documentation

## 2019-05-12 DIAGNOSIS — D631 Anemia in chronic kidney disease: Secondary | ICD-10-CM | POA: Diagnosis present

## 2019-05-12 DIAGNOSIS — Z85038 Personal history of other malignant neoplasm of large intestine: Secondary | ICD-10-CM | POA: Insufficient documentation

## 2019-05-12 DIAGNOSIS — D75839 Thrombocytosis, unspecified: Secondary | ICD-10-CM

## 2019-05-12 DIAGNOSIS — M069 Rheumatoid arthritis, unspecified: Secondary | ICD-10-CM | POA: Diagnosis not present

## 2019-05-12 DIAGNOSIS — Z79899 Other long term (current) drug therapy: Secondary | ICD-10-CM | POA: Insufficient documentation

## 2019-05-12 DIAGNOSIS — R5383 Other fatigue: Secondary | ICD-10-CM | POA: Diagnosis not present

## 2019-05-12 DIAGNOSIS — D72829 Elevated white blood cell count, unspecified: Secondary | ICD-10-CM | POA: Diagnosis not present

## 2019-05-12 NOTE — Progress Notes (Signed)
[facility] Initial Visit:  Patient Care Team: [name], MD as PCP - General (Internal Medicine)  CHIEF COMPLAINTS/PURPOSE OF CONSULTATION: Leukocytosis/anemia/thrombocytosis  HISTORY OF PRESENTING ILLNESS: Kevin Lopez 68 y.o. male presents today for consult regarding elevated white blood cell count, anemia, and thrombocytosis.  Past medical history significant for rheumatoid arthritis, hypertension, chronic kidney disease stage III, diabetes, colon cancer, secondary hyper parathyroidism.  He was diagnosed with rheumatoid arthritis in 1983.  The patient has been on multiple immunosuppressive drugs for management of his RA.  He most recently was started on Orencia.  He has been off and on of prednisone since 1983.  His RA is manageable.  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  He is often on p.o. Bactrim and topical Bactrim.  He is followed by dermatology.   He reports his white blood cell count and his platelet count has been elevated for the last several years.  He has never been referred to hematology for work-up. He denies most recent labs from 04/27/2019 revealed a white blood cell count of 17.3, neutrophil count of 9.5 and absolute lymphocyte count of 6.6, hemoglobin 9.7 and a platelet count of 460,000.  Nephrology also performed a serum protein electrophoresis which was negative for monoclonal gammopathy.  Free light chains were also evaluated with normal kappa at 20.1, lambda 16.6, and normal ratio 1.21.  Ferritin level also assessed and was 239 ng/mL.  He also has a past medical history significant for colon cancer diagnosed in 2003 he underwent colon resection in 6 months of Xeloda.  He was a resident of [place] at that time.  He reports mild to moderate fatigue.  Denies any obvious signs of bleeding.  He does report a sensation of being cold.  Denies any fevers, chills, night sweats.  He is not a smoker.   Denies alcohol or illicit drug use.  He has a family history significant for head and neck cancer in his mother.  Review of Systems  Constitutional: Positive for fatigue.  HENT:  Negative.   Eyes: Negative.   Respiratory: Negative.   Cardiovascular: Negative.   Gastrointestinal: Negative.   Endocrine: Negative.   Genitourinary: Negative.    Musculoskeletal: Positive for arthralgias and myalgias.  Skin: Negative.   Neurological: Negative.   Hematological: Negative.   Psychiatric/Behavioral: Negative.     MEDICAL HISTORY: Past Medical History:  Diagnosis Date  . Diverticulosis   . Hypertension   . RA (rheumatoid arthritis) ([place])   . SBO (small bowel obstruction) s/p ex lap in past 12/15/2015    SURGICAL HISTORY: Past Surgical History:  Procedure Laterality Date  . APPENDECTOMY    . COLOSTOMY TAKEDOWN     [place] N/A 12/15/2015   Procedure: FLEXIBLE SIGMOIDOSCOPY;  Surgeon: Danie Binder, MD;  Location: [facility];  Service: Endoscopy;  Laterality: N/A;  . KNEE SURGERY    . LEFT COLECTOMY     [place]  . LYSIS OF ADHESION     SBO    SOCIAL HISTORY: Social History   Socioeconomic History  . Marital status: Single    Spouse name: Not on file  . Number of children: 3  . Years of education: Not on file  . Highest education level: Not on file  Occupational History  . Occupation: retired  Tobacco Use  . Smoking status: Never Smoker  . Smokeless tobacco: Never Used  Substance and Sexual Activity  . Alcohol use:  Yes    Comment: occ.   . Drug use: No  . Sexual activity: Not on file  Other Topics Concern  . Not on file  Social History Narrative  . Not on file   Social Determinants of Health   Financial Resource Strain:   . Difficulty of Paying Living Expenses: Not on file  Food Insecurity:   . Worried About Charity fundraiser in the Last Year: Not on file  . Ran Out of Food in the Last Year: Not on file  Transportation Needs:   . Lack of  Transportation (Medical): Not on file  . Lack of Transportation (Non-Medical): Not on file  Physical Activity:   . Days of Exercise per Week: Not on file  . Minutes of Exercise per Session: Not on file  Stress:   . Feeling of Stress : Not on file  Social Connections:   . Frequency of Communication with Friends and Family: Not on file  . Frequency of Social Gatherings with Friends and Family: Not on file  . Attends Religious Services: Not on file  . Active Member of Clubs or Organizations: Not on file  . Attends Archivist Meetings: Not on file  . Marital Status: Not on file  Intimate Partner Violence:   . Fear of Current or Ex-Partner: Not on file  . Emotionally Abused: Not on file  . Physically Abused: Not on file  . Sexually Abused: Not on file    FAMILY HISTORY Family History  Problem Relation Age of Onset  . Heart attack Father   . Cancer Mother   . Hypertension Brother   . Hypertension Sister     ALLERGIES:  is allergic to latex.  MEDICATIONS:  Current Outpatient Medications  Medication Sig Dispense Refill  . Abatacept (ORENCIA) 125 MG/ML SOSY Inject 125 mg into the skin once a week. On Thursday. (Patient taking differently: Inject 125 mg into the skin every 30 (thirty) days. On Thursday. )    . adapalene (DIFFERIN) 0.1 % cream Apply 1 application topically daily as needed.  6  . albuterol (PROVENTIL HFA;VENTOLIN HFA) 108 (90 Base) MCG/ACT inhaler Inhale 2 puffs into the lungs every 6 (six) hours as needed for wheezing or shortness of breath.    Marland Kitchen amLODipine (NORVASC) 10 MG tablet Take 10 mg by mouth daily.    . Ascorbic Acid (VITAMIN C) 1000 MG tablet Take 1,000 mg by mouth daily.    . carvedilol (COREG) 3.125 MG tablet Take 3.125 mg by mouth 2 (two) times daily with a meal.    . Cinnamon 500 MG capsule Take 500 mg by mouth daily.    . clonazePAM (KLONOPIN) 1 MG tablet Take 1 mg by mouth as needed for anxiety.    . Coenzyme Q10 (CO Q10) 100 MG CAPS Take 100  mg by mouth 2 (two) times daily.    . diclofenac Sodium (VOLTAREN) 1 % GEL Apply 2 g topically 4 (four) times daily. Rub into affected area of foot 2 to 4 times daily (Patient not taking: Reported on 05/12/2019) 100 g 2  . glipiZIDE (GLUCOTROL XL) 5 MG 24 hr tablet Take 5 mg by mouth daily with breakfast.    . hydrOXYzine (ATARAX/VISTARIL) 25 MG tablet Take 25 mg by mouth 2 (two) times daily.    Marland Kitchen ketoconazole (NIZORAL) 2 % cream Apply 1 application topically daily as needed for irritation.   2  . magnesium gluconate (MAGONATE) 500 MG tablet Take 500 mg by mouth  daily. Take 250 mg daily    . Multiple Vitamin (MULTIVITAMIN WITH MINERALS) TABS tablet Take 1 tablet by mouth daily.    . Omega-3 Fatty Acids (FISH OIL) 1200 MG CAPS Take by mouth every other day.    . telmisartan (MICARDIS) 20 MG tablet Take 20 mg by mouth daily.    Marland Kitchen zolpidem (AMBIEN CR) 12.5 MG CR tablet Take 12.5 mg by mouth at bedtime as needed for sleep.     No current facility-administered medications for this visit.    PHYSICAL EXAMINATION:  ECOG PERFORMANCE STATUS: 1 - Symptomatic but completely ambulatory   Vitals:   05/12/19 1325  BP: (!) 159/83  Pulse: 81  Resp: 18  Temp: (!) 97.3 F (36.3 C)  SpO2: 100%    Filed Weights   05/12/19 1325  Weight: 169 lb 6.4 oz (76.8 kg)     Physical Exam Constitutional:      Appearance: Normal appearance.  HENT:     Head: Normocephalic.     Right Ear: External ear normal.     Left Ear: External ear normal.     Nose: Nose normal.  Eyes:     Conjunctiva/sclera: Conjunctivae normal.  Cardiovascular:     Rate and Rhythm: Normal rate and regular rhythm.     Pulses: Normal pulses.     Heart sounds: Normal heart sounds.  Pulmonary:     Effort: Pulmonary effort is normal.     Breath sounds: Normal breath sounds.  Abdominal:     General: Bowel sounds are normal.  Musculoskeletal:     Cervical back: Normal range of motion.     Comments: Decreased range of motion   Skin:    General: Skin is warm.  Neurological:     General: No focal deficit present.     Mental Status: He is alert and oriented to person, place, and time.  Psychiatric:        Mood and Affect: Mood normal.        Behavior: Behavior normal.      LABORATORY DATA: I have personally reviewed the data as listed:  No visits with results within 1 Month(s) from this visit.  Latest known visit with results is:  Admission on 12/15/2015, Discharged on 12/21/2015  Component Date Value Ref Range Status  . Lipase 12/15/2015 32  11 - 51 U/L Final  . Sodium 12/15/2015 139  135 - 145 mmol/L Final  . Potassium 12/15/2015 3.5  3.5 - 5.1 mmol/L Final  . Chloride 12/15/2015 103  101 - 111 mmol/L Final  . CO2 12/15/2015 27  22 - 32 mmol/L Final  . Glucose, Bld 12/15/2015 142* 65 - 99 mg/dL Final  . BUN 12/15/2015 16  6 - 20 mg/dL Final  . Creatinine, Ser 12/15/2015 1.26* 0.61 - 1.24 mg/dL Final  . Calcium 12/15/2015 9.4  8.9 - 10.3 mg/dL Final  . Total Protein 12/15/2015 7.9  6.5 - 8.1 g/dL Final  . Albumin 12/15/2015 4.5  3.5 - 5.0 g/dL Final  . AST 12/15/2015 19  15 - 41 U/L Final  . ALT 12/15/2015 19  17 - 63 U/L Final  . Alkaline Phosphatase 12/15/2015 43  38 - 126 U/L Final  . Total Bilirubin 12/15/2015 1.1  0.3 - 1.2 mg/dL Final  . GFR calc non Af Amer 12/15/2015 59* >60 mL/min Final  . GFR calc Af Amer 12/15/2015 >60  >60 mL/min Final   Comment: (NOTE) The eGFR has been calculated using the CKD EPI equation.  This calculation has not been validated in all clinical situations. eGFR's persistently <60 mL/min signify possible Chronic Kidney Disease.   . Anion gap 12/15/2015 9  5 - 15 Final  . WBC 12/15/2015 19.4* 4.0 - 10.5 K/uL Final  . RBC 12/15/2015 4.43  4.22 - 5.81 MIL/uL Final  . Hemoglobin 12/15/2015 11.9* 13.0 - 17.0 g/dL Final  . HCT 12/15/2015 36.0* 39.0 - 52.0 % Final  . MCV 12/15/2015 81.3  78.0 - 100.0 fL Final  . MCH 12/15/2015 26.9  26.0 - 34.0 pg Final  . MCHC  12/15/2015 33.1  30.0 - 36.0 g/dL Final  . RDW 12/15/2015 14.9  11.5 - 15.5 % Final  . Platelets 12/15/2015 311  150 - 400 K/uL Final  . Lactic Acid, Venous 12/15/2015 1.3  0.5 - 1.9 mmol/L Final  . Lactic Acid, Venous 12/15/2015 1.2  0.5 - 1.9 mmol/L Final  . TSH 12/15/2015 1.094  0.350 - 4.500 uIU/mL Final  . WBC 12/16/2015 21.4* 4.0 - 10.5 K/uL Final  . RBC 12/16/2015 4.25  4.22 - 5.81 MIL/uL Final  . Hemoglobin 12/16/2015 11.3* 13.0 - 17.0 g/dL Final  . HCT 12/16/2015 35.6* 39.0 - 52.0 % Final  . MCV 12/16/2015 83.8  78.0 - 100.0 fL Final  . MCH 12/16/2015 26.6  26.0 - 34.0 pg Final  . MCHC 12/16/2015 31.7  30.0 - 36.0 g/dL Final  . RDW 12/16/2015 15.1  11.5 - 15.5 % Final  . Platelets 12/16/2015 281  150 - 400 K/uL Final  . Sodium 12/16/2015 137  135 - 145 mmol/L Final  . Potassium 12/16/2015 3.9  3.5 - 5.1 mmol/L Final  . Chloride 12/16/2015 107  101 - 111 mmol/L Final  . CO2 12/16/2015 21* 22 - 32 mmol/L Final  . Glucose, Bld 12/16/2015 83  65 - 99 mg/dL Final  . BUN 12/16/2015 12  6 - 20 mg/dL Final  . Creatinine, Ser 12/16/2015 1.36* 0.61 - 1.24 mg/dL Final  . Calcium 12/16/2015 8.6* 8.9 - 10.3 mg/dL Final  . GFR calc non Af Amer 12/16/2015 54* >60 mL/min Final  . GFR calc Af Amer 12/16/2015 >60  >60 mL/min Final   Comment: (NOTE) The eGFR has been calculated using the CKD EPI equation. This calculation has not been validated in all clinical situations. eGFR's persistently <60 mL/min signify possible Chronic Kidney Disease.   . Anion gap 12/16/2015 9  5 - 15 Final  . WBC 12/17/2015 17.2* 4.0 - 10.5 K/uL Final  . RBC 12/17/2015 3.48* 4.22 - 5.81 MIL/uL Final  . Hemoglobin 12/17/2015 9.1* 13.0 - 17.0 g/dL Final  . HCT 12/17/2015 29.3* 39.0 - 52.0 % Final  . MCV 12/17/2015 84.2  78.0 - 100.0 fL Final  . MCH 12/17/2015 26.1  26.0 - 34.0 pg Final  . MCHC 12/17/2015 31.1  30.0 - 36.0 g/dL Final  . RDW 12/17/2015 15.0  11.5 - 15.5 % Final  . Platelets 12/17/2015 244  150 -  400 K/uL Final  . Sodium 12/17/2015 137  135 - 145 mmol/L Final  . Potassium 12/17/2015 3.7  3.5 - 5.1 mmol/L Final  . Chloride 12/17/2015 104  101 - 111 mmol/L Final  . CO2 12/17/2015 21* 22 - 32 mmol/L Final  . Glucose, Bld 12/17/2015 66  65 - 99 mg/dL Final  . BUN 12/17/2015 15  6 - 20 mg/dL Final  . Creatinine, Ser 12/17/2015 1.41* 0.61 - 1.24 mg/dL Final  . Calcium 12/17/2015 8.5* 8.9 - 10.3 mg/dL Final  .  GFR calc non Af Amer 12/17/2015 52* >60 mL/min Final  . GFR calc Af Amer 12/17/2015 60* >60 mL/min Final   Comment: (NOTE) The eGFR has been calculated using the CKD EPI equation. This calculation has not been validated in all clinical situations. eGFR's persistently <60 mL/min signify possible Chronic Kidney Disease.   . Anion gap 12/17/2015 12  5 - 15 Final  . Glucose-Capillary 12/17/2015 121* 65 - 99 mg/dL Final  . WBC 12/18/2015 12.2* 4.0 - 10.5 K/uL Final  . RBC 12/18/2015 3.37* 4.22 - 5.81 MIL/uL Final  . Hemoglobin 12/18/2015 8.9* 13.0 - 17.0 g/dL Final  . HCT 12/18/2015 27.9* 39.0 - 52.0 % Final  . MCV 12/18/2015 82.8  78.0 - 100.0 fL Final  . MCH 12/18/2015 26.4  26.0 - 34.0 pg Final  . MCHC 12/18/2015 31.9  30.0 - 36.0 g/dL Final  . RDW 12/18/2015 14.7  11.5 - 15.5 % Final  . Platelets 12/18/2015 239  150 - 400 K/uL Final  . Sodium 12/18/2015 136  135 - 145 mmol/L Final  . Potassium 12/18/2015 3.4* 3.5 - 5.1 mmol/L Final  . Chloride 12/18/2015 102  101 - 111 mmol/L Final  . CO2 12/18/2015 26  22 - 32 mmol/L Final  . Glucose, Bld 12/18/2015 126* 65 - 99 mg/dL Final  . BUN 12/18/2015 7  6 - 20 mg/dL Final  . Creatinine, Ser 12/18/2015 1.14  0.61 - 1.24 mg/dL Final  . Calcium 12/18/2015 8.3* 8.9 - 10.3 mg/dL Final  . GFR calc non Af Amer 12/18/2015 >60  >60 mL/min Final  . GFR calc Af Amer 12/18/2015 >60  >60 mL/min Final   Comment: (NOTE) The eGFR has been calculated using the CKD EPI equation. This calculation has not been validated in all clinical  situations. eGFR's persistently <60 mL/min signify possible Chronic Kidney Disease.   . Anion gap 12/18/2015 8  5 - 15 Final  . Glucose-Capillary 12/17/2015 95  65 - 99 mg/dL Final  . Glucose-Capillary 12/18/2015 112* 65 - 99 mg/dL Final  . Glucose-Capillary 12/18/2015 146* 65 - 99 mg/dL Final  . Glucose-Capillary 12/18/2015 117* 65 - 99 mg/dL Final  . Comment 1 12/18/2015 Notify RN   Final  . Comment 2 12/18/2015 Document in Chart   Final  . MRSA by PCR 12/18/2015 NEGATIVE  NEGATIVE Final   Comment:        The GeneXpert MRSA Assay (FDA approved for NASAL specimens only), is one component of a comprehensive MRSA colonization surveillance program. It is not intended to diagnose MRSA infection nor to guide or monitor treatment for MRSA infections.   . Glucose-Capillary 12/18/2015 130* 65 - 99 mg/dL Final  . WBC 12/19/2015 13.7* 4.0 - 10.5 K/uL Final  . RBC 12/19/2015 3.79* 4.22 - 5.81 MIL/uL Final  . Hemoglobin 12/19/2015 10.1* 13.0 - 17.0 g/dL Final  . HCT 12/19/2015 31.1* 39.0 - 52.0 % Final  . MCV 12/19/2015 82.1  78.0 - 100.0 fL Final  . MCH 12/19/2015 26.6  26.0 - 34.0 pg Final  . MCHC 12/19/2015 32.5  30.0 - 36.0 g/dL Final  . RDW 12/19/2015 14.6  11.5 - 15.5 % Final  . Platelets 12/19/2015 290  150 - 400 K/uL Final  . Sodium 12/19/2015 136  135 - 145 mmol/L Final  . Potassium 12/19/2015 3.6  3.5 - 5.1 mmol/L Final  . Chloride 12/19/2015 103  101 - 111 mmol/L Final  . CO2 12/19/2015 24  22 - 32 mmol/L Final  .  Glucose, Bld 12/19/2015 109* 65 - 99 mg/dL Final  . BUN 12/19/2015 <5* 6 - 20 mg/dL Final  . Creatinine, Ser 12/19/2015 1.27* 0.61 - 1.24 mg/dL Final  . Calcium 12/19/2015 8.8* 8.9 - 10.3 mg/dL Final  . GFR calc non Af Amer 12/19/2015 58* >60 mL/min Final  . GFR calc Af Amer 12/19/2015 >60  >60 mL/min Final   Comment: (NOTE) The eGFR has been calculated using the CKD EPI equation. This calculation has not been validated in all clinical situations. eGFR's  persistently <60 mL/min signify possible Chronic Kidney Disease.   . Anion gap 12/19/2015 9  5 - 15 Final  . Glucose-Capillary 12/18/2015 116* 65 - 99 mg/dL Final  . Comment 1 12/18/2015 Notify RN   Final  . Comment 2 12/18/2015 Document in Chart   Final  . Glucose-Capillary 12/18/2015 110* 65 - 99 mg/dL Final  . Glucose-Capillary 12/19/2015 115* 65 - 99 mg/dL Final  . Glucose-Capillary 12/19/2015 110* 65 - 99 mg/dL Final  . Comment 1 12/19/2015 Notify RN   Final  . Comment 2 12/19/2015 Document in Chart   Final  . Uric Acid, Serum 12/19/2015 4.4  4.4 - 7.6 mg/dL Final  . WBC 12/20/2015 8.5  4.0 - 10.5 K/uL Final  . RBC 12/20/2015 3.58* 4.22 - 5.81 MIL/uL Final  . Hemoglobin 12/20/2015 9.3* 13.0 - 17.0 g/dL Final  . HCT 12/20/2015 28.7* 39.0 - 52.0 % Final  . MCV 12/20/2015 80.2  78.0 - 100.0 fL Final  . MCH 12/20/2015 26.0  26.0 - 34.0 pg Final  . MCHC 12/20/2015 32.4  30.0 - 36.0 g/dL Final  . RDW 12/20/2015 13.8  11.5 - 15.5 % Final  . Platelets 12/20/2015 267  150 - 400 K/uL Final  . Sodium 12/20/2015 135  135 - 145 mmol/L Final  . Potassium 12/20/2015 4.2  3.5 - 5.1 mmol/L Final  . Chloride 12/20/2015 101  101 - 111 mmol/L Final  . CO2 12/20/2015 24  22 - 32 mmol/L Final  . Glucose, Bld 12/20/2015 175* 65 - 99 mg/dL Final  . BUN 12/20/2015 6  6 - 20 mg/dL Final  . Creatinine, Ser 12/20/2015 1.22  0.61 - 1.24 mg/dL Final  . Calcium 12/20/2015 8.8* 8.9 - 10.3 mg/dL Final  . GFR calc non Af Amer 12/20/2015 >60  >60 mL/min Final  . GFR calc Af Amer 12/20/2015 >60  >60 mL/min Final   Comment: (NOTE) The eGFR has been calculated using the CKD EPI equation. This calculation has not been validated in all clinical situations. eGFR's persistently <60 mL/min signify possible Chronic Kidney Disease.   . Anion gap 12/20/2015 10  5 - 15 Final  . WBC 12/21/2015 12.8* 4.0 - 10.5 K/uL Final  . RBC 12/21/2015 3.44* 4.22 - 5.81 MIL/uL Final  . Hemoglobin 12/21/2015 9.0* 13.0 - 17.0 g/dL  Final  . HCT 12/21/2015 27.8* 39.0 - 52.0 % Final  . MCV 12/21/2015 80.8  78.0 - 100.0 fL Final  . MCH 12/21/2015 26.2  26.0 - 34.0 pg Final  . MCHC 12/21/2015 32.4  30.0 - 36.0 g/dL Final  . RDW 12/21/2015 14.1  11.5 - 15.5 % Final  . Platelets 12/21/2015 287  150 - 400 K/uL Final  . Sodium 12/21/2015 137  135 - 145 mmol/L Final  . Potassium 12/21/2015 4.4  3.5 - 5.1 mmol/L Final  . Chloride 12/21/2015 104  101 - 111 mmol/L Final  . CO2 12/21/2015 26  22 - 32 mmol/L Final  .  Glucose, Bld 12/21/2015 166* 65 - 99 mg/dL Final  . BUN 12/21/2015 10  6 - 20 mg/dL Final  . Creatinine, Ser 12/21/2015 1.27* 0.61 - 1.24 mg/dL Final  . Calcium 12/21/2015 8.6* 8.9 - 10.3 mg/dL Final  . GFR calc non Af Amer 12/21/2015 58* >60 mL/min Final  . GFR calc Af Amer 12/21/2015 >60  >60 mL/min Final   Comment: (NOTE) The eGFR has been calculated using the CKD EPI equation. This calculation has not been validated in all clinical situations. eGFR's persistently <60 mL/min signify possible Chronic Kidney Disease.   . Anion gap 12/21/2015 7  5 - 15 Final     ASSESSMENT/PLAN  Leukocytosis 1. Microcytic anemia - Secondary to chronic inflammation with contributing CKD and immunosuppressive medications.  -Chronic kidney disease stage III -Labs from April 27, 2019 revealed a hemoglobin of 9.7, MCV of 77, ferritin level of 239. -Negative serum protein electrophoresis.  Normal kappa lambda free light chain ratio. -Discussed differential diagnosis of anemia.  He has multiple contributing factors including chronic inflammation, immunosuppressive drugs, autoimmune disorder, and chronic kidney disease.  We will assess additional lab studies to further rule out any underlying nutritional or myeloproliferative disorders.  Have also recommended patient complete stool studies due to his history of colon cancer. -We will follow-up with him in 2 weeks to discuss lab results.  2. Leukocytosis: - Recent labs from  04/27/2019 revealed a white blood cell count of 17.3, neutrophil count of 9.5 and absolute lymphocyte count of 6.6.  -Again, patient has several contributing factors that could elevate his white blood cell count; infections, inflammation, medications, and/or myeloproliferative disorders.  He is also at risk for lymphoproliferative disorders due to his history of RA.  Have recommended to complete flow cytometry to further evaluate leukocytosis.  3.  Thrombocytosis -Labs from April 27, 2019 revealed a platelet count of 460,000. -Discussed differential diagnosis of thrombocytosis including but not limited to anemia, iron deficiency, infections, inflammation, rheumatology conditions.  In his case thrombocytosis could possibly multifactorial.  We will do additional lab studies to identify underlying etiology.  4.  Rheumatoid arthritis -Originally diagnosed in 1983. -Has been on multiple immunosuppressive drugs.  Currently on Orencia.  Has been on steroids periodically since diagnosis.  5.  History of colon cancer -Diagnosed in 2003, diagnosed in [place]. -Most likely stage II.  Underwent colon resection and 6 months of Xeloda therapy.  Addendum: -I have independently interviewed and examined the patient.  I agree with the HPI and assessment plan created by my nurse practitioner Carver Fila, FNP.  For his microcytic anemia, we will do nutritional deficiency work-up.  We will check her stool for occult blood.  He will likely benefit from parenteral iron therapy given his chronic kidney disease.  He also has leukocytosis with elevated lymphocytes.  He is not on any steroids.  Hence we will do flow cytometry as there is high incidence of lymphoproliferative disorders from long history of rheumatoid arthritis.   Orders Placed This Encounter  Procedures  . Flow Cytometry  . CBC with Differential    Standing Status:   Future    Standing Expiration Date:   05/11/2020  . Comprehensive metabolic panel     Standing Status:   Future    Standing Expiration Date:   05/11/2020  . Pathologist smear review    Standing Status:   Future    Standing Expiration Date:   05/11/2020  . Lactate dehydrogenase    Standing Status:   Future  Standing Expiration Date:   05/11/2020  . Iron and TIBC    Standing Status:   Future    Standing Expiration Date:   05/11/2020  . Ferritin    Standing Status:   Future    Standing Expiration Date:   05/11/2020  . Vitamin B12    Standing Status:   Future    Standing Expiration Date:   05/11/2020  . Folate    Standing Status:   Future    Standing Expiration Date:   05/11/2020  . Methylmalonic acid, serum    Standing Status:   Future    Standing Expiration Date:   05/11/2020  . Copper, serum    Standing Status:   Future    Standing Expiration Date:   05/11/2020  . JAK2 [id number], w Reflex to CALR/E12/MPL    Standing Status:   Future    Standing Expiration Date:   05/11/2020    All questions were answered. The patient knows to call the clinic with any problems, questions or concerns.  This note was electronically signed.    Derek Jack, MD  05/12/2019 5:43 PM

## 2019-05-12 NOTE — Assessment & Plan Note (Signed)
1. Microcytic anemia - Secondary to chronic inflammation with contributing CKD and immunosuppressive medications.  -Chronic kidney disease stage III -Labs from April 27, 2019 revealed a hemoglobin of 9.7, MCV of 77, ferritin level of 239. -Negative serum protein electrophoresis.  Normal kappa lambda free light chain ratio. -Discussed differential diagnosis of anemia.  He has multiple contributing factors including chronic inflammation, immunosuppressive drugs, autoimmune disorder, and chronic kidney disease.  We will assess additional lab studies to further rule out any underlying nutritional or myeloproliferative disorders.  Have also recommended patient complete stool studies due to his history of colon cancer. -We will follow-up with him in 2 weeks to discuss lab results.  2. Leukocytosis: - Recent labs from 04/27/2019 revealed a white blood cell count of 17.3, neutrophil count of 9.5 and absolute lymphocyte count of 6.6.  -Again, patient has several contributing factors that could elevate his white blood cell count; infections, inflammation, medications, and/or myeloproliferative disorders.  He is also at risk for lymphoproliferative disorders due to his history of RA.  Have recommended to complete flow cytometry to further evaluate leukocytosis.  3.  Thrombocytosis -Labs from April 27, 2019 revealed a platelet count of 460,000. -Discussed differential diagnosis of thrombocytosis including but not limited to anemia, iron deficiency, infections, inflammation, rheumatology conditions.  In his case thrombocytosis could possibly multifactorial.  We will do additional lab studies to identify underlying etiology.  4.  Rheumatoid arthritis -Originally diagnosed in 1983. -Has been on multiple immunosuppressive drugs.  Currently on Orencia.  Has been on steroids periodically since diagnosis.  5.  History of colon cancer -Diagnosed in 2003, diagnosed in [place]. -Most likely stage II.   Underwent colon resection and 6 months of Xeloda therapy.

## 2019-05-28 ENCOUNTER — Other Ambulatory Visit (HOSPITAL_COMMUNITY): Payer: Self-pay | Admitting: *Deleted

## 2019-05-28 DIAGNOSIS — D75839 Thrombocytosis, unspecified: Secondary | ICD-10-CM

## 2019-05-28 DIAGNOSIS — D473 Essential (hemorrhagic) thrombocythemia: Secondary | ICD-10-CM

## 2019-05-28 LAB — OCCULT BLOOD X 1 CARD TO LAB, STOOL
Fecal Occult Bld: NEGATIVE
Fecal Occult Bld: NEGATIVE
Fecal Occult Bld: NEGATIVE

## 2019-05-31 LAB — SURGICAL PATHOLOGY

## 2019-06-01 ENCOUNTER — Other Ambulatory Visit: Payer: Self-pay

## 2019-06-02 ENCOUNTER — Inpatient Hospital Stay (HOSPITAL_COMMUNITY): Payer: Medicare Other | Attending: Hematology | Admitting: Hematology

## 2019-06-02 ENCOUNTER — Inpatient Hospital Stay (HOSPITAL_COMMUNITY): Payer: Medicare Other

## 2019-06-02 DIAGNOSIS — D72829 Elevated white blood cell count, unspecified: Secondary | ICD-10-CM

## 2019-06-02 DIAGNOSIS — Z809 Family history of malignant neoplasm, unspecified: Secondary | ICD-10-CM | POA: Insufficient documentation

## 2019-06-02 DIAGNOSIS — G479 Sleep disorder, unspecified: Secondary | ICD-10-CM | POA: Insufficient documentation

## 2019-06-02 DIAGNOSIS — D631 Anemia in chronic kidney disease: Secondary | ICD-10-CM | POA: Diagnosis not present

## 2019-06-02 DIAGNOSIS — D72825 Bandemia: Secondary | ICD-10-CM | POA: Diagnosis not present

## 2019-06-02 DIAGNOSIS — Z85038 Personal history of other malignant neoplasm of large intestine: Secondary | ICD-10-CM | POA: Diagnosis not present

## 2019-06-02 DIAGNOSIS — R195 Other fecal abnormalities: Secondary | ICD-10-CM | POA: Insufficient documentation

## 2019-06-02 DIAGNOSIS — M069 Rheumatoid arthritis, unspecified: Secondary | ICD-10-CM | POA: Insufficient documentation

## 2019-06-02 DIAGNOSIS — D75839 Thrombocytosis, unspecified: Secondary | ICD-10-CM

## 2019-06-02 DIAGNOSIS — M25531 Pain in right wrist: Secondary | ICD-10-CM | POA: Diagnosis not present

## 2019-06-02 DIAGNOSIS — Z79899 Other long term (current) drug therapy: Secondary | ICD-10-CM | POA: Insufficient documentation

## 2019-06-02 DIAGNOSIS — D649 Anemia, unspecified: Secondary | ICD-10-CM

## 2019-06-02 DIAGNOSIS — N1831 Chronic kidney disease, stage 3a: Secondary | ICD-10-CM

## 2019-06-02 DIAGNOSIS — R5383 Other fatigue: Secondary | ICD-10-CM | POA: Insufficient documentation

## 2019-06-02 DIAGNOSIS — N183 Chronic kidney disease, stage 3 unspecified: Secondary | ICD-10-CM | POA: Insufficient documentation

## 2019-06-02 DIAGNOSIS — Z8249 Family history of ischemic heart disease and other diseases of the circulatory system: Secondary | ICD-10-CM | POA: Insufficient documentation

## 2019-06-02 DIAGNOSIS — D473 Essential (hemorrhagic) thrombocythemia: Secondary | ICD-10-CM

## 2019-06-02 LAB — CBC WITH DIFFERENTIAL/PLATELET
Abs Immature Granulocytes: 0.09 10*3/uL — ABNORMAL HIGH (ref 0.00–0.07)
Basophils Absolute: 0.1 10*3/uL (ref 0.0–0.1)
Basophils Relative: 1 %
Eosinophils Absolute: 0.8 10*3/uL — ABNORMAL HIGH (ref 0.0–0.5)
Eosinophils Relative: 6 %
HCT: 31.2 % — ABNORMAL LOW (ref 39.0–52.0)
Hemoglobin: 9.8 g/dL — ABNORMAL LOW (ref 13.0–17.0)
Immature Granulocytes: 1 %
Lymphocytes Relative: 24 %
Lymphs Abs: 3.4 10*3/uL (ref 0.7–4.0)
MCH: 25.7 pg — ABNORMAL LOW (ref 26.0–34.0)
MCHC: 31.4 g/dL (ref 30.0–36.0)
MCV: 81.7 fL (ref 80.0–100.0)
Monocytes Absolute: 0.8 10*3/uL (ref 0.1–1.0)
Monocytes Relative: 5 %
Neutro Abs: 8.8 10*3/uL — ABNORMAL HIGH (ref 1.7–7.7)
Neutrophils Relative %: 63 %
Platelets: 367 10*3/uL (ref 150–400)
RBC: 3.82 MIL/uL — ABNORMAL LOW (ref 4.22–5.81)
RDW: 15.4 % (ref 11.5–15.5)
WBC: 13.9 10*3/uL — ABNORMAL HIGH (ref 4.0–10.5)
nRBC: 0 % (ref 0.0–0.2)

## 2019-06-02 LAB — IRON AND TIBC
Iron: 42 ug/dL — ABNORMAL LOW (ref 45–182)
Saturation Ratios: 13 % — ABNORMAL LOW (ref 17.9–39.5)
TIBC: 333 ug/dL (ref 250–450)
UIBC: 291 ug/dL

## 2019-06-02 LAB — COMPREHENSIVE METABOLIC PANEL
ALT: 14 U/L (ref 0–44)
AST: 16 U/L (ref 15–41)
Albumin: 4.3 g/dL (ref 3.5–5.0)
Alkaline Phosphatase: 55 U/L (ref 38–126)
Anion gap: 8 (ref 5–15)
BUN: 22 mg/dL (ref 8–23)
CO2: 25 mmol/L (ref 22–32)
Calcium: 9.3 mg/dL (ref 8.9–10.3)
Chloride: 104 mmol/L (ref 98–111)
Creatinine, Ser: 1.45 mg/dL — ABNORMAL HIGH (ref 0.61–1.24)
GFR calc Af Amer: 57 mL/min — ABNORMAL LOW (ref >60)
GFR calc non Af Amer: 49 mL/min — ABNORMAL LOW (ref >60)
Glucose, Bld: 131 mg/dL — ABNORMAL HIGH (ref 70–99)
Potassium: 4.4 mmol/L (ref 3.5–5.1)
Sodium: 137 mmol/L (ref 135–145)
Total Bilirubin: 0.5 mg/dL (ref 0.3–1.2)
Total Protein: 7.6 g/dL (ref 6.5–8.1)

## 2019-06-02 LAB — LACTATE DEHYDROGENASE: LDH: 115 U/L (ref 98–192)

## 2019-06-02 LAB — VITAMIN B12: Vitamin B-12: 601 pg/mL (ref 180–914)

## 2019-06-02 LAB — FERRITIN: Ferritin: 112 ng/mL (ref 24–336)

## 2019-06-02 LAB — FOLATE: Folate: 20.1 ng/mL (ref >5.9)

## 2019-06-02 NOTE — Patient Instructions (Addendum)
[place] at [facility] Discharge Instructions  You were seen today by Dr. Delton Coombes. He went over your recent test results. He will have blood drawn today before you leave. He will schedule you for 2 doses of IV iron. He will call you with your lab results.  Thank you for choosing [place] at [facility] to provide your oncology and hematology care.  To afford each patient quality time with our provider, please arrive at least 15 minutes before your scheduled appointment time.   If you have a lab appointment with the [facility] please come in thru the  Main Entrance and check in at the main information desk  You need to re-schedule your appointment should you arrive 10 or more minutes late.  We strive to give you quality time with our providers, and arriving late affects you and other patients whose appointments are after yours.  Also, if you no show three or more times for appointments you may be dismissed from the clinic at the providers discretion.     Again, thank you for choosing [facility].  Our hope is that these requests will decrease the amount of time that you wait before being seen by our physicians.       _____________________________________________________________  Should you have questions after your visit to [facility], please contact our office at [phone] between the hours of 8:00 a.m. and 4:30 p.m.  Voicemails left after 4:00 p.m. will not be returned until the following business day.  For prescription refill requests, have your pharmacy contact our office and allow 72 hours.    Cancer Center Support Programs:   > Cancer Support Group  2nd Tuesday of the month 1pm-2pm, Journey Room

## 2019-06-02 NOTE — Progress Notes (Signed)
Kevin Lopez, [place] [address]   CLINIC:  Medical Oncology/Hematology  PCP:  [name], [place] [address] [place] [address] [phone]   REASON FOR VISIT:  Follow-up for leukocytosis and normocytic anemia.  CURRENT THERAPY: Feraheme weekly x2.    INTERVAL HISTORY:  Kevin Lopez 69 y.o. male seen for follow-up of normocytic anemia and leukocytosis.  We have checked his stool cards for occult blood.  He denies any bleeding per rectum or melena.  Complains of feeling very tired.  Appetite is 75%.  Energy levels are 25%.  Pain in the right wrist from rheumatoid arthritis rated as 7 out of 10.    REVIEW OF SYSTEMS:  Review of Systems  Constitutional: Positive for fatigue.  Psychiatric/Behavioral: Positive for sleep disturbance.  All other systems reviewed and are negative.    PAST MEDICAL/SURGICAL HISTORY:  Past Medical History:  Diagnosis Date  . Diverticulosis   . Hypertension   . RA (rheumatoid arthritis) ([place])   . SBO (small bowel obstruction) s/p ex lap in past 12/15/2015   Past Surgical History:  Procedure Laterality Date  . APPENDECTOMY    . COLOSTOMY TAKEDOWN     [place] N/A 12/15/2015   Procedure: FLEXIBLE SIGMOIDOSCOPY;  Surgeon: Danie Binder, MD;  Location: [facility];  Service: Endoscopy;  Laterality: N/A;  . KNEE SURGERY    . LEFT COLECTOMY     [place]  . LYSIS OF ADHESION     SBO     SOCIAL HISTORY:  Social History   Socioeconomic History  . Marital status: Single    Spouse name: Not on file  . Number of children: 3  . Years of education: Not on file  . Highest education level: Not on file  Occupational History  . Occupation: retired  Tobacco Use  . Smoking status: Never Smoker  . Smokeless tobacco: Never Used  Substance and Sexual Activity  . Alcohol use: Yes    Comment: occ.   . Drug use: No  . Sexual activity: Not on file  Other Topics Concern   . Not on file  Social History Narrative  . Not on file   Social Determinants of Health   Financial Resource Strain:   . Difficulty of Paying Living Expenses: Not on file  Food Insecurity:   . Worried About Charity fundraiser in the Last Year: Not on file  . Ran Out of Food in the Last Year: Not on file  Transportation Needs:   . Lack of Transportation (Medical): Not on file  . Lack of Transportation (Non-Medical): Not on file  Physical Activity:   . Days of Exercise per Week: Not on file  . Minutes of Exercise per Session: Not on file  Stress:   . Feeling of Stress : Not on file  Social Connections:   . Frequency of Communication with Friends and Family: Not on file  . Frequency of Social Gatherings with Friends and Family: Not on file  . Attends Religious Services: Not on file  . Active Member of Clubs or Organizations: Not on file  . Attends Archivist Meetings: Not on file  . Marital Status: Not on file  Intimate Partner Violence:   . Fear of Current or Ex-Partner: Not on file  . Emotionally Abused: Not on file  . Physically Abused: Not on file  . Sexually Abused: Not on file    FAMILY HISTORY:  Family History  Problem Relation Age of Onset  . Heart attack Father   . Cancer Mother   . Hypertension Brother   . Hypertension Sister     CURRENT MEDICATIONS:  Outpatient Encounter Medications as of 06/02/2019  Medication Sig  . Abatacept (ORENCIA) 125 MG/ML SOSY Inject 125 mg into the skin once a week. On Thursday. (Patient taking differently: Inject 125 mg into the skin every 30 (thirty) days. On Thursday. )  . adapalene (DIFFERIN) 0.1 % cream Apply 1 application topically daily as needed.  Marland Kitchen albuterol (PROVENTIL HFA;VENTOLIN HFA) 108 (90 Base) MCG/ACT inhaler Inhale 2 puffs into the lungs every 6 (six) hours as needed for wheezing or shortness of breath.  Marland Kitchen amLODipine (NORVASC) 10 MG tablet Take 10 mg by mouth daily.  . Ascorbic Acid (VITAMIN C) 1000 MG  tablet Take 1,000 mg by mouth daily.  . carvedilol (COREG) 3.125 MG tablet Take 3.125 mg by mouth 2 (two) times daily with a meal.  . Cinnamon 500 MG capsule Take 500 mg by mouth daily.  . clonazePAM (KLONOPIN) 1 MG tablet Take 1 mg by mouth as needed for anxiety.  . Coenzyme Q10 (CO Q10) 100 MG CAPS Take 100 mg by mouth 2 (two) times daily.  . diclofenac Sodium (VOLTAREN) 1 % GEL Apply 2 g topically 4 (four) times daily. Rub into affected area of foot 2 to 4 times daily (Patient not taking: Reported on 05/12/2019)  . glipiZIDE (GLUCOTROL XL) 5 MG 24 hr tablet Take 5 mg by mouth daily with breakfast.  . HYDROcodone-acetaminophen (NORCO/VICODIN) 5-325 MG tablet hydrocodone 5 mg-acetaminophen 325 mg tablet  TAKE 1 TABLET BY MOUTH EVERY 6 HOURS FOR 5 DAYS  . hydrOXYzine (ATARAX/VISTARIL) 25 MG tablet Take 25 mg by mouth 2 (two) times daily.  Marland Kitchen ketoconazole (NIZORAL) 2 % cream Apply 1 application topically daily as needed for irritation.   . magnesium gluconate (MAGONATE) 500 MG tablet Take 500 mg by mouth daily. Take 250 mg daily  . methocarbamol (ROBAXIN) 500 MG tablet Take 500 mg by mouth 4 (four) times daily. Pt is only taking prn  . Multiple Vitamin (MULTIVITAMIN WITH MINERALS) TABS tablet Take 1 tablet by mouth daily.  . Omega-3 Fatty Acids (FISH OIL) 1200 MG CAPS Take by mouth every other day.  . sulfamethoxazole-trimethoprim (BACTRIM DS) 800-160 MG tablet Take 1 tablet by mouth daily.   Marland Kitchen telmisartan (MICARDIS) 20 MG tablet Take 20 mg by mouth daily.  Marland Kitchen zolpidem (AMBIEN CR) 12.5 MG CR tablet Take 12.5 mg by mouth at bedtime as needed for sleep.  . [DISCONTINUED] cycloSPORINE (RESTASIS) 0.05 % ophthalmic emulsion Restasis 0.05 % eye drops in a dropperette  . [DISCONTINUED] NARCAN 4 MG/0.1ML LIQD nasal spray kit    No facility-administered encounter medications on file as of 06/02/2019.    ALLERGIES:  Allergies  Allergen Reactions  . Latex Shortness Of Breath     PHYSICAL EXAM:  ECOG  Performance status: 1  Vitals:   06/02/19 1056  BP: 140/71  Pulse: 68  Resp: 14  Temp: 97.7 F (36.5 C)  SpO2: 100%   Filed Weights   06/02/19 1056  Weight: 170 lb 14.4 oz (77.5 kg)    Physical Exam Vitals reviewed.  Constitutional:      Appearance: Normal appearance.  Cardiovascular:     Rate and Rhythm: Normal rate and regular rhythm.     Heart sounds: Normal heart sounds.  Pulmonary:     Effort: Pulmonary effort is normal.  Breath sounds: Normal breath sounds.  Abdominal:     General: There is no distension.     Palpations: Abdomen is soft. There is no mass.  Neurological:     General: No focal deficit present.     Mental Status: He is alert and oriented to person, place, and time.  Psychiatric:        Mood and Affect: Mood normal.        Behavior: Behavior normal.      LABORATORY DATA:  I have reviewed the labs as listed.  CBC    Component Value Date/Time   WBC 13.9 (H) 06/02/2019 1439   RBC 3.82 (L) 06/02/2019 1439   HGB 9.8 (L) 06/02/2019 1439   HCT 31.2 (L) 06/02/2019 1439   PLT 367 06/02/2019 1439   MCV 81.7 06/02/2019 1439   MCH 25.7 (L) 06/02/2019 1439   MCHC 31.4 06/02/2019 1439   RDW 15.4 06/02/2019 1439   LYMPHSABS 3.4 06/02/2019 1439   MONOABS 0.8 06/02/2019 1439   EOSABS 0.8 (H) 06/02/2019 1439   BASOSABS 0.1 06/02/2019 1439   CMP Latest Ref Rng & Units 06/02/2019 12/21/2015 12/20/2015  Glucose 70 - 99 mg/dL 131(H) 166(H) 175(H)  BUN 8 - 23 mg/dL 22 10 6   Creatinine 0.61 - 1.24 mg/dL 1.45(H) 1.27(H) 1.22  Sodium 135 - 145 mmol/L 137 137 135  Potassium 3.5 - 5.1 mmol/L 4.4 4.4 4.2  Chloride 98 - 111 mmol/L 104 104 101  CO2 22 - 32 mmol/L 25 26 24   Calcium 8.9 - 10.3 mg/dL 9.3 8.6(L) 8.8(L)  Total Protein 6.5 - 8.1 g/dL 7.6 - -  Total Bilirubin 0.3 - 1.2 mg/dL 0.5 - -  Alkaline Phos 38 - 126 U/L 55 - -  AST 15 - 41 U/L 16 - -  ALT 0 - 44 U/L 14 - -       DIAGNOSTIC IMAGING:  I have independently reviewed the scans and discussed  with the patient.     ASSESSMENT & PLAN:   Leukocytosis 1.  Leukocytosis: -Labs from outside on 04/27/2019 shows white count 17.3, ANC 9.5 and ALC 6.6. -I discussed the results of flow cytometry on 05/12/2019 did not show any monoclonal B-cell population.  Predominance of lymphocytes with nonspecific changes. -These nonspecific changes are likely from rheumatoid arthritis. -We will send JAK2 V6 59F and reflex mutation testing.  2.  Normocytic anemia: -Hemoglobin is 9.8.  Ferritin is 112.  Percent saturation is 13.  MCV was normal.  V42 and folic acid are normal. -This is a combination anemia from CKD and relative iron deficiency. -We checked stool cards x3 which were negative.  SPEP was negative.  Free light chain ratio was normal. -I have recommended Feraheme weekly infusion x2.  We discussed side effects including allergic reactions.  He does not have any drug allergies.  Hence we will proceed without 3 medicines. -I plan to reevaluate him in 6 weeks with repeat CBC, ferritin and iron panel.  3.  Rheumatoid arthritis: -Diagnosed in 2013.  Had been on multiple immunosuppressive medications.  Currently on Orencia.  4.  Colon cancer: -He had colon cancer in 2003, status post resection and treated with 6 months of Xeloda in [place].      Orders placed this encounter:  No orders of the defined types were placed in this encounter.     Derek Jack, MD [place] [phone]

## 2019-06-02 NOTE — Assessment & Plan Note (Addendum)
1.  Leukocytosis: -Labs from outside on 04/27/2019 shows white count 17.3, ANC 9.5 and ALC 6.6. -I discussed the results of flow cytometry on 05/12/2019 did not show any monoclonal B-cell population.  Predominance of lymphocytes with nonspecific changes. -These nonspecific changes are likely from rheumatoid arthritis. -We will send JAK2 V6 42F and reflex mutation testing.  2.  Normocytic anemia: -Hemoglobin is 9.8.  Ferritin is 112.  Percent saturation is 13.  MCV was normal.  123456 and folic acid are normal. -This is a combination anemia from CKD and relative iron deficiency. -We checked stool cards x3 which were negative.  SPEP was negative.  Free light chain ratio was normal. -I have recommended Feraheme weekly infusion x2.  We discussed side effects including allergic reactions.  He does not have any drug allergies.  Hence we will proceed without 3 medicines. -I plan to reevaluate him in 6 weeks with repeat CBC, ferritin and iron panel.  3.  Rheumatoid arthritis: -Diagnosed in 2013.  Had been on multiple immunosuppressive medications.  Currently on Orencia.  4.  Colon cancer: -He had colon cancer in 2003, status post resection and treated with 6 months of Xeloda in [place].

## 2019-06-03 ENCOUNTER — Other Ambulatory Visit (HOSPITAL_COMMUNITY): Payer: Self-pay | Admitting: *Deleted

## 2019-06-03 ENCOUNTER — Encounter (HOSPITAL_COMMUNITY): Payer: Self-pay | Admitting: Hematology

## 2019-06-03 ENCOUNTER — Ambulatory Visit: Payer: Medicare Other | Admitting: Podiatry

## 2019-06-03 DIAGNOSIS — D649 Anemia, unspecified: Secondary | ICD-10-CM

## 2019-06-03 DIAGNOSIS — N183 Chronic kidney disease, stage 3 unspecified: Secondary | ICD-10-CM | POA: Insufficient documentation

## 2019-06-03 LAB — PATHOLOGIST SMEAR REVIEW

## 2019-06-04 LAB — COPPER, SERUM: Copper: 141 ug/dL — ABNORMAL HIGH (ref 69–132)

## 2019-06-05 LAB — METHYLMALONIC ACID, SERUM: Methylmalonic Acid, Quantitative: 68 nmol/L (ref 0–378)

## 2019-06-07 ENCOUNTER — Other Ambulatory Visit: Payer: Self-pay

## 2019-06-07 ENCOUNTER — Encounter (HOSPITAL_COMMUNITY): Payer: Self-pay

## 2019-06-07 ENCOUNTER — Inpatient Hospital Stay (HOSPITAL_COMMUNITY): Payer: Medicare Other

## 2019-06-07 VITALS — BP 106/51 | HR 53 | Temp 97.1°F | Resp 18

## 2019-06-07 DIAGNOSIS — R195 Other fecal abnormalities: Secondary | ICD-10-CM | POA: Diagnosis not present

## 2019-06-07 DIAGNOSIS — D649 Anemia, unspecified: Secondary | ICD-10-CM

## 2019-06-07 DIAGNOSIS — N1831 Chronic kidney disease, stage 3a: Secondary | ICD-10-CM

## 2019-06-07 DIAGNOSIS — M25531 Pain in right wrist: Secondary | ICD-10-CM | POA: Diagnosis not present

## 2019-06-07 DIAGNOSIS — D72829 Elevated white blood cell count, unspecified: Secondary | ICD-10-CM | POA: Diagnosis not present

## 2019-06-07 DIAGNOSIS — M069 Rheumatoid arthritis, unspecified: Secondary | ICD-10-CM | POA: Diagnosis not present

## 2019-06-07 DIAGNOSIS — N183 Chronic kidney disease, stage 3 unspecified: Secondary | ICD-10-CM | POA: Diagnosis not present

## 2019-06-07 DIAGNOSIS — D631 Anemia in chronic kidney disease: Secondary | ICD-10-CM | POA: Diagnosis not present

## 2019-06-07 MED ORDER — SODIUM CHLORIDE 0.9 % IV SOLN: Freq: Once | INTRAVENOUS | Status: AC

## 2019-06-07 MED ORDER — SODIUM CHLORIDE 0.9 % IV SOLN
510.0000 mg | Freq: Once | INTRAVENOUS | Status: AC
  Administered 2019-06-07: 15:00:00 510 mg via INTRAVENOUS
  Filled 2019-06-07: qty 510

## 2019-06-07 NOTE — Patient Instructions (Signed)
[facility] at [facility]  Discharge Instructions:   _______________________________________________________________  Thank you for choosing [facility] at [facility] to provide your oncology and hematology care.  To afford each patient quality time with our providers, please arrive at least 15 minutes before your scheduled appointment.  You need to re-schedule your appointment if you arrive 10 or more minutes late.  We strive to give you quality time with our providers, and arriving late affects you and other patients whose appointments are after yours.  Also, if you no show three or more times for appointments you may be dismissed from the clinic.  Again, thank you for choosing [facility] at [facility]. Our hope is that these requests will allow you access to exceptional care and in a timely manner. _______________________________________________________________  If you have questions after your visit, please contact our office at [phone] between the hours of 8:30 a.m. and 5:00 p.m. Voicemails left after 4:30 p.m. will not be returned until the following business day. _______________________________________________________________  For prescription refill requests, have your pharmacy contact our office. _______________________________________________________________  Recommendations made by the consultant and any test results will be sent to your referring physician. _______________________________________________________________ 

## 2019-06-07 NOTE — Progress Notes (Signed)
Patient presents today for Feraheme infusion. MAR reviewed and updated. Schedule and labs printed for patient. Teaching performed. Patient verbalized understanding of reaction signs and symptoms. Call bell within reach. Patient has no complaints of any pain today or changes since his last visit.    Feraheme given today per MD orders. Tolerated infusion without adverse affects. Vital signs stable. No complaints at this time. Discharged from clinic ambulatory. F/U with [facility] as scheduled.

## 2019-06-09 ENCOUNTER — Inpatient Hospital Stay (HOSPITAL_BASED_OUTPATIENT_CLINIC_OR_DEPARTMENT_OTHER): Payer: Medicare Other | Admitting: Hematology

## 2019-06-09 ENCOUNTER — Encounter (HOSPITAL_COMMUNITY): Payer: Self-pay | Admitting: Hematology

## 2019-06-09 DIAGNOSIS — D649 Anemia, unspecified: Secondary | ICD-10-CM

## 2019-06-09 NOTE — Progress Notes (Signed)
Virtual Visit via Telephone Note  I connected with Kevin Lopez on 06/09/19 at  4:20 PM EST by telephone and verified that I am speaking with the correct person using two identifiers.   I discussed the limitations, risks, security and privacy concerns of performing an evaluation and management service by telephone and the availability of in person appointments. I also discussed with the patient that there may be a patient responsible charge related to this service. The patient expressed understanding and agreed to proceed.   History of Present Illness: He is seen in our clinic for leukocytosis and normocytic anemia.  His stool was tested -3 times.  He also has long history of rheumatoid arthritis since 2013 and has been on multiple immunosuppressive medications, currently on Orencia.  He has personal history of colon cancer diagnosed in 2003, status post surgery and treated with 6 months of Xeloda in [place].   Observations/Objective: Denies any bleeding per rectum or melena.  Has received first parenteral iron infusion on 06/07/2019.  He has not noticed any improvement in energy level yet.  Reports appetite and energy levels at 50%.  Pain in the bilateral knees rated as 8 out of 10.  Chronic constipation is stable.  Assessment and Plan:  1.  Normocytic anemia: -Anemia from combination of CKD and relative iron deficiency. -We reviewed labs.  Ferritin is 112 and percent saturation is 13.  123456, copper, folic acid are normal.  Hemoglobin was 9.8. -He received first infusion of Feraheme on 06/07/2019.  His second infusion is on 29th of this month. -I plan to repeat his labs in 6 to 8 weeks to see response.  If his hemoglobin is staying between 8 and 9, he will also require erythropoiesis stimulating agents.  2.  Leukocytosis: -CBC on 06/02/2019 shows white count 13.9 with 63% neutrophils, 24th of lymphocytes. -We have sent to JAK2 V617F reflex mutation testing which is pending at this  time. -Flow cytometry on 05/12/2019 did not show any monoclonal B-cell population.  Predominance of lymphocytes with nonspecific changes.  3.  Rheumatoid arthritis: -Diagnosed in 2013.  Has been on multiple immunosuppressive medications.  Currently on Orencia.   Follow Up Instructions:    I discussed the assessment and treatment plan with the patient. The patient was provided an opportunity to ask questions and all were answered. The patient agreed with the plan and demonstrated an understanding of the instructions.   The patient was advised to call back or seek an in-person evaluation if the symptoms worsen or if the condition fails to improve as anticipated.  I provided 11 minutes of non-face-to-face time during this encounter.   Derek Jack, MD

## 2019-06-11 DIAGNOSIS — Z23 Encounter for immunization: Secondary | ICD-10-CM | POA: Diagnosis not present

## 2019-06-14 LAB — CALR + JAK2 E12-15 + MPL (REFLEXED)

## 2019-06-14 LAB — JAK2 V617F, W REFLEX TO CALR/E12/MPL

## 2019-06-16 DIAGNOSIS — M0589 Other rheumatoid arthritis with rheumatoid factor of multiple sites: Secondary | ICD-10-CM | POA: Diagnosis not present

## 2019-06-17 DIAGNOSIS — M05842 Other rheumatoid arthritis with rheumatoid factor of left hand: Secondary | ICD-10-CM | POA: Diagnosis not present

## 2019-06-17 DIAGNOSIS — M05841 Other rheumatoid arthritis with rheumatoid factor of right hand: Secondary | ICD-10-CM | POA: Diagnosis not present

## 2019-06-17 DIAGNOSIS — G5621 Lesion of ulnar nerve, right upper limb: Secondary | ICD-10-CM | POA: Diagnosis not present

## 2019-06-18 ENCOUNTER — Inpatient Hospital Stay (HOSPITAL_COMMUNITY): Payer: Medicare Other

## 2019-06-18 ENCOUNTER — Other Ambulatory Visit: Payer: Self-pay

## 2019-06-18 ENCOUNTER — Encounter (HOSPITAL_COMMUNITY): Payer: Self-pay

## 2019-06-18 VITALS — BP 136/61 | HR 62 | Temp 96.9°F | Resp 18

## 2019-06-18 DIAGNOSIS — N183 Chronic kidney disease, stage 3 unspecified: Secondary | ICD-10-CM | POA: Diagnosis not present

## 2019-06-18 DIAGNOSIS — D631 Anemia in chronic kidney disease: Secondary | ICD-10-CM | POA: Diagnosis not present

## 2019-06-18 DIAGNOSIS — D649 Anemia, unspecified: Secondary | ICD-10-CM

## 2019-06-18 DIAGNOSIS — R195 Other fecal abnormalities: Secondary | ICD-10-CM | POA: Diagnosis not present

## 2019-06-18 DIAGNOSIS — M25531 Pain in right wrist: Secondary | ICD-10-CM | POA: Diagnosis not present

## 2019-06-18 DIAGNOSIS — D72829 Elevated white blood cell count, unspecified: Secondary | ICD-10-CM | POA: Diagnosis not present

## 2019-06-18 DIAGNOSIS — N1831 Chronic kidney disease, stage 3a: Secondary | ICD-10-CM

## 2019-06-18 DIAGNOSIS — M069 Rheumatoid arthritis, unspecified: Secondary | ICD-10-CM | POA: Diagnosis not present

## 2019-06-18 MED ORDER — SODIUM CHLORIDE 0.9 % IV SOLN
510.0000 mg | Freq: Once | INTRAVENOUS | Status: AC
  Administered 2019-06-18: 510 mg via INTRAVENOUS
  Filled 2019-06-18: qty 510

## 2019-06-18 MED ORDER — SODIUM CHLORIDE 0.9 % IV SOLN: Freq: Once | INTRAVENOUS | Status: AC

## 2019-06-18 NOTE — Patient Instructions (Signed)
[facility] at [facility]  Discharge Instructions:   _______________________________________________________________  Thank you for choosing [facility] at [facility] to provide your oncology and hematology care.  To afford each patient quality time with our providers, please arrive at least 15 minutes before your scheduled appointment.  You need to re-schedule your appointment if you arrive 10 or more minutes late.  We strive to give you quality time with our providers, and arriving late affects you and other patients whose appointments are after yours.  Also, if you no show three or more times for appointments you may be dismissed from the clinic.  Again, thank you for choosing [facility] at [facility]. Our hope is that these requests will allow you access to exceptional care and in a timely manner. _______________________________________________________________  If you have questions after your visit, please contact our office at [phone] between the hours of 8:30 a.m. and 5:00 p.m. Voicemails left after 4:30 p.m. will not be returned until the following business day. _______________________________________________________________  For prescription refill requests, have your pharmacy contact our office. _______________________________________________________________  Recommendations made by the consultant and any test results will be sent to your referring physician. _______________________________________________________________ 

## 2019-06-18 NOTE — Progress Notes (Signed)
Patient presents today for Feraheme infusion . Patient has no complaints of any changes since his last visit. Patient requested copies of his lab work for genetic testing. Reviewed request with RLockamy NP. Per NP, " May give the patient copies of his lab work and Dr. Delton Coombes will review with him on his next office visit."   Feraheme given today per MD orders. Tolerated infusion without adverse affects. Vital signs stable. No complaints at this time. Discharged from clinic ambulatory. F/U with [facility] as scheduled.

## 2019-06-22 DIAGNOSIS — M25562 Pain in left knee: Secondary | ICD-10-CM | POA: Diagnosis not present

## 2019-06-22 DIAGNOSIS — M0589 Other rheumatoid arthritis with rheumatoid factor of multiple sites: Secondary | ICD-10-CM | POA: Diagnosis not present

## 2019-06-22 DIAGNOSIS — M109 Gout, unspecified: Secondary | ICD-10-CM | POA: Diagnosis not present

## 2019-06-22 DIAGNOSIS — M549 Dorsalgia, unspecified: Secondary | ICD-10-CM | POA: Diagnosis not present

## 2019-06-22 DIAGNOSIS — N289 Disorder of kidney and ureter, unspecified: Secondary | ICD-10-CM | POA: Diagnosis not present

## 2019-06-22 DIAGNOSIS — Z79899 Other long term (current) drug therapy: Secondary | ICD-10-CM | POA: Diagnosis not present

## 2019-06-22 DIAGNOSIS — M25569 Pain in unspecified knee: Secondary | ICD-10-CM | POA: Diagnosis not present

## 2019-06-22 DIAGNOSIS — M1712 Unilateral primary osteoarthritis, left knee: Secondary | ICD-10-CM | POA: Diagnosis not present

## 2019-07-13 DIAGNOSIS — Z23 Encounter for immunization: Secondary | ICD-10-CM | POA: Diagnosis not present

## 2019-07-14 ENCOUNTER — Inpatient Hospital Stay (HOSPITAL_COMMUNITY): Payer: Medicare Other | Attending: Hematology

## 2019-07-14 ENCOUNTER — Other Ambulatory Visit: Payer: Self-pay

## 2019-07-14 DIAGNOSIS — D649 Anemia, unspecified: Secondary | ICD-10-CM | POA: Insufficient documentation

## 2019-07-14 LAB — CBC WITH DIFFERENTIAL/PLATELET
Abs Immature Granulocytes: 0.05 10*3/uL (ref 0.00–0.07)
Basophils Absolute: 0.1 10*3/uL (ref 0.0–0.1)
Basophils Relative: 1 %
Eosinophils Absolute: 0.5 10*3/uL (ref 0.0–0.5)
Eosinophils Relative: 4 %
HCT: 33.8 % — ABNORMAL LOW (ref 39.0–52.0)
Hemoglobin: 10.7 g/dL — ABNORMAL LOW (ref 13.0–17.0)
Immature Granulocytes: 0 %
Lymphocytes Relative: 13 %
Lymphs Abs: 1.6 10*3/uL (ref 0.7–4.0)
MCH: 26.5 pg (ref 26.0–34.0)
MCHC: 31.7 g/dL (ref 30.0–36.0)
MCV: 83.7 fL (ref 80.0–100.0)
Monocytes Absolute: 0.7 10*3/uL (ref 0.1–1.0)
Monocytes Relative: 6 %
Neutro Abs: 9.2 10*3/uL — ABNORMAL HIGH (ref 1.7–7.7)
Neutrophils Relative %: 76 %
Platelets: 298 10*3/uL (ref 150–400)
RBC: 4.04 MIL/uL — ABNORMAL LOW (ref 4.22–5.81)
RDW: 16.1 % — ABNORMAL HIGH (ref 11.5–15.5)
WBC: 12.2 10*3/uL — ABNORMAL HIGH (ref 4.0–10.5)
nRBC: 0 % (ref 0.0–0.2)

## 2019-07-14 LAB — IRON AND TIBC
Iron: 45 ug/dL (ref 45–182)
Saturation Ratios: 15 % — ABNORMAL LOW (ref 17.9–39.5)
TIBC: 291 ug/dL (ref 250–450)
UIBC: 246 ug/dL

## 2019-07-14 LAB — FERRITIN: Ferritin: 450 ng/mL — ABNORMAL HIGH (ref 24–336)

## 2019-07-15 ENCOUNTER — Inpatient Hospital Stay (HOSPITAL_COMMUNITY): Payer: Medicare Other

## 2019-07-16 DIAGNOSIS — M0589 Other rheumatoid arthritis with rheumatoid factor of multiple sites: Secondary | ICD-10-CM | POA: Diagnosis not present

## 2019-07-20 ENCOUNTER — Other Ambulatory Visit: Payer: Self-pay

## 2019-07-20 ENCOUNTER — Inpatient Hospital Stay (HOSPITAL_COMMUNITY): Payer: Medicare Other | Attending: Hematology | Admitting: Hematology

## 2019-07-20 ENCOUNTER — Encounter (HOSPITAL_COMMUNITY): Payer: Self-pay | Admitting: Hematology

## 2019-07-20 VITALS — BP 126/78 | HR 79 | Temp 97.1°F | Resp 19 | Wt 169.0 lb

## 2019-07-20 DIAGNOSIS — D72829 Elevated white blood cell count, unspecified: Secondary | ICD-10-CM | POA: Insufficient documentation

## 2019-07-20 DIAGNOSIS — D631 Anemia in chronic kidney disease: Secondary | ICD-10-CM | POA: Insufficient documentation

## 2019-07-20 DIAGNOSIS — M069 Rheumatoid arthritis, unspecified: Secondary | ICD-10-CM | POA: Insufficient documentation

## 2019-07-20 DIAGNOSIS — M25532 Pain in left wrist: Secondary | ICD-10-CM | POA: Diagnosis not present

## 2019-07-20 DIAGNOSIS — N1831 Chronic kidney disease, stage 3a: Secondary | ICD-10-CM | POA: Diagnosis not present

## 2019-07-20 DIAGNOSIS — D649 Anemia, unspecified: Secondary | ICD-10-CM | POA: Diagnosis not present

## 2019-07-20 DIAGNOSIS — Z79899 Other long term (current) drug therapy: Secondary | ICD-10-CM | POA: Insufficient documentation

## 2019-07-20 NOTE — Progress Notes (Signed)
[name], [name]   CLINIC:  Medical Oncology/Hematology  PCP:  [name], [place] [address] [phone]   REASON FOR VISIT:  Follow-up for leukocytosis and normocytic anemia.  CURRENT THERAPY: Feraheme weekly x2.    INTERVAL HISTORY:  [name] 69 y.o. male seen for follow-up of for normocytic anemia and leukocytosis.  Denies any bleeding per rectum or melena.  Reports energy levels improved by 50 to 60%.  His last iron infusion was on 06/18/2019.  Still continues to feel cold at times.  Pain in his left wrist and knees is rated as 5 out of 10.  Appetite and energy levels are 50%.    REVIEW OF SYSTEMS:  Review of Systems  Constitutional: Positive for chills.  Psychiatric/Behavioral: Positive for sleep disturbance.  All other systems reviewed and are negative.    PAST MEDICAL/SURGICAL HISTORY:  Past Medical History:  Diagnosis Date  . Diverticulosis   . Hypertension   . RA (rheumatoid arthritis) ([place])   . SBO (small bowel obstruction) s/p ex lap in past 12/15/2015   Past Surgical History:  Procedure Laterality Date  . APPENDECTOMY    . COLOSTOMY TAKEDOWN     [place] N/A 12/15/2015   Procedure: FLEXIBLE SIGMOIDOSCOPY;  Surgeon: Danie Binder, MD;  Location: [facility];  Service: Endoscopy;  Laterality: N/A;  . KNEE SURGERY    . LEFT COLECTOMY     [place]  . LYSIS OF ADHESION     [place]     SOCIAL HISTORY:  Social History   Socioeconomic History  . Marital status: Single    Spouse name: Not on file  . Number of children: 3  . Years of education: Not on file  . Highest education level: Not on file  Occupational History  . Occupation: retired  Tobacco Use  . Smoking status: Never Smoker  . Smokeless tobacco: Never Used  Substance and Sexual Activity  . Alcohol use: Yes    Comment: occ.   . Drug use: No  . Sexual activity: Not on  file  Other Topics Concern  . Not on file  Social History Narrative  . Not on file   Social Determinants of Health   Financial Resource Strain:   . Difficulty of Paying Living Expenses: Not on file  Food Insecurity:   . Worried About Charity fundraiser in the Last Year: Not on file  . Ran Out of Food in the Last Year: Not on file  Transportation Needs:   . Lack of Transportation (Medical): Not on file  . Lack of Transportation (Non-Medical): Not on file  Physical Activity:   . Days of Exercise per Week: Not on file  . Minutes of Exercise per Session: Not on file  Stress:   . Feeling of Stress : Not on file  Social Connections:   . Frequency of Communication with Friends and Family: Not on file  . Frequency of Social Gatherings with Friends and Family: Not on file  . Attends Religious Services: Not on file  . Active Member of Clubs or Organizations: Not on file  . Attends Archivist Meetings: Not on file  . Marital Status: Not on file  Intimate Partner Violence:   . Fear of Current or Ex-Partner: Not on file  . Emotionally Abused: Not on file  . Physically Abused: Not on file  . Sexually Abused: Not on  file    FAMILY HISTORY:  Family History  Problem Relation Age of Onset  . Heart attack Father   . Cancer Mother   . Hypertension Brother   . Hypertension Sister     CURRENT MEDICATIONS:  Outpatient Encounter Medications as of 07/20/2019  Medication Sig  . Abatacept (ORENCIA) 125 MG/ML SOSY Inject 125 mg into the skin once a week. On Thursday. (Patient taking differently: Inject 125 mg into the skin every 30 (thirty) days. On Thursday. )  . amLODipine (NORVASC) 10 MG tablet Take 10 mg by mouth daily.  . Ascorbic Acid (VITAMIN C) 1000 MG tablet Take 1,000 mg by mouth daily.  . carvedilol (COREG) 3.125 MG tablet Take 3.125 mg by mouth 2 (two) times daily with a meal.  . Cinnamon 500 MG capsule Take 500 mg by mouth daily.  . Coenzyme Q10 (CO Q10) 100 MG CAPS Take  100 mg by mouth 2 (two) times daily.  Marland Kitchen glipiZIDE (GLUCOTROL XL) 5 MG 24 hr tablet Take 5 mg by mouth daily with breakfast.  . hydrOXYzine (ATARAX/VISTARIL) 25 MG tablet Take 25 mg by mouth 2 (two) times daily.  Marland Kitchen lovastatin (MEVACOR) 20 MG tablet   . magnesium gluconate (MAGONATE) 500 MG tablet Take 500 mg by mouth daily. Take 250 mg daily  . Multiple Vitamin (MULTIVITAMIN WITH MINERALS) TABS tablet Take 1 tablet by mouth daily.  . Omega-3 Fatty Acids (FISH OIL) 1200 MG CAPS Take by mouth every other day.  . telmisartan (MICARDIS) 20 MG tablet Take 20 mg by mouth daily.  Marland Kitchen adapalene (DIFFERIN) 0.1 % cream Apply 1 application topically daily as needed.  Marland Kitchen albuterol (PROVENTIL HFA;VENTOLIN HFA) 108 (90 Base) MCG/ACT inhaler Inhale 2 puffs into the lungs every 6 (six) hours as needed for wheezing or shortness of breath.  . clonazePAM (KLONOPIN) 1 MG tablet Take 1 mg by mouth as needed for anxiety.  . diclofenac Sodium (VOLTAREN) 1 % GEL Apply 2 g topically 4 (four) times daily. Rub into affected area of foot 2 to 4 times daily (Patient not taking: Reported on 07/20/2019)  . HYDROcodone-acetaminophen (NORCO/VICODIN) 5-325 MG tablet hydrocodone 5 mg-acetaminophen 325 mg tablet  TAKE 1 TABLET BY MOUTH EVERY 6 HOURS FOR 5 DAYS  . ketoconazole (NIZORAL) 2 % cream Apply 1 application topically daily as needed for irritation.   . methocarbamol (ROBAXIN) 500 MG tablet Take 500 mg by mouth 4 (four) times daily. Pt is only taking prn  . sulfamethoxazole-trimethoprim (BACTRIM DS) 800-160 MG tablet Take 1 tablet by mouth daily.    No facility-administered encounter medications on file as of 07/20/2019.    ALLERGIES:  Allergies  Allergen Reactions  . Latex Shortness Of Breath     PHYSICAL EXAM:  ECOG Performance status: 1  Vitals:   07/20/19 1213  BP: 126/78  Pulse: 79  Resp: 19  Temp: (!) 97.1 F (36.2 C)  SpO2: 100%   Filed Weights   07/20/19 1213  Weight: 169 lb (76.7 kg)    Physical  Exam Vitals reviewed.  Constitutional:      Appearance: Normal appearance.  Cardiovascular:     Rate and Rhythm: Normal rate and regular rhythm.     Heart sounds: Normal heart sounds.  Pulmonary:     Effort: Pulmonary effort is normal.     Breath sounds: Normal breath sounds.  Abdominal:     General: There is no distension.     Palpations: Abdomen is soft. There is no mass.  Neurological:  General: No focal deficit present.     Mental Status: He is alert and oriented to person, place, and time.  Psychiatric:        Mood and Affect: Mood normal.        Behavior: Behavior normal.      LABORATORY DATA:  I have reviewed the labs as listed.  CBC    Component Value Date/Time   WBC 12.2 (H) 07/14/2019 1307   RBC 4.04 (L) 07/14/2019 1307   HGB 10.7 (L) 07/14/2019 1307   HCT 33.8 (L) 07/14/2019 1307   PLT 298 07/14/2019 1307   MCV 83.7 07/14/2019 1307   MCH 26.5 07/14/2019 1307   MCHC 31.7 07/14/2019 1307   RDW 16.1 (H) 07/14/2019 1307   LYMPHSABS 1.6 07/14/2019 1307   MONOABS 0.7 07/14/2019 1307   EOSABS 0.5 07/14/2019 1307   BASOSABS 0.1 07/14/2019 1307   CMP Latest Ref Rng & Units 06/02/2019 12/21/2015 12/20/2015  Glucose 70 - 99 mg/dL 131(H) 166(H) 175(H)  BUN 8 - 23 mg/dL 22 10 6   Creatinine 0.61 - 1.24 mg/dL 1.45(H) 1.27(H) 1.22  Sodium 135 - 145 mmol/L 137 137 135  Potassium 3.5 - 5.1 mmol/L 4.4 4.4 4.2  Chloride 98 - 111 mmol/L 104 104 101  CO2 22 - 32 mmol/L 25 26 24   Calcium 8.9 - 10.3 mg/dL 9.3 8.6(L) 8.8(L)  Total Protein 6.5 - 8.1 g/dL 7.6 - -  Total Bilirubin 0.3 - 1.2 mg/dL 0.5 - -  Alkaline Phos 38 - 126 U/L 55 - -  AST 15 - 41 U/L 16 - -  ALT 0 - 44 U/L 14 - -       DIAGNOSTIC IMAGING:  None     ASSESSMENT & PLAN:   Normocytic anemia 1.  Normocytic anemia: -Combination anemia from CKD and relative iron deficiency. -2 infusions of Feraheme on 06/07/2019 and 06/18/2019. -Stool for occult blood was negative.  Denies any bleeding per rectum or  melena. -Reports improvement in energy levels.  Still feels cold at times. -We reviewed labs from 07/14/2019.  Hemoglobin improved to 10.7.  Ferritin is 450 and percent saturation is 15. -Because of symptoms, I have recommended 1 more infusion of Feraheme. -We will reevaluate with repeat labs in 2 months.  2.  Leukocytosis: -CBC on 07/14/2019 shows white count 12.2.  Absolute neutrophil count is 9.2.  Platelet count is normal. -Flow cytometry on 05/12/2019 did not show any monoclonal B-cell population.  Predominance of lymphocytes and nonspecific changes. -We reviewed results of JAK2 V617F and reflex testing which was negative. -I will do BCR/ABL by FISH prior to next visit.  3.  Rheumatoid arthritis: -Diagnosed in 2013.  He had been on multiple immunosuppressive medications.  Currently on Orencia.      Orders placed this encounter:  Orders Placed This Encounter  Procedures  . CBC with Differential/Platelet  . Iron and TIBC  . Ferritin  . BCR-ABL1 FISH      Kevin Lopez, [place] [phone]

## 2019-07-20 NOTE — Assessment & Plan Note (Signed)
1.  Normocytic anemia: -Combination anemia from CKD and relative iron deficiency. -2 infusions of Feraheme on 06/07/2019 and 06/18/2019. -Stool for occult blood was negative.  Denies any bleeding per rectum or melena. -Reports improvement in energy levels.  Still feels cold at times. -We reviewed labs from 07/14/2019.  Hemoglobin improved to 10.7.  Ferritin is 450 and percent saturation is 15. -Because of symptoms, I have recommended 1 more infusion of Feraheme. -We will reevaluate with repeat labs in 2 months.  2.  Leukocytosis: -CBC on 07/14/2019 shows white count 12.2.  Absolute neutrophil count is 9.2.  Platelet count is normal. -Flow cytometry on 05/12/2019 did not show any monoclonal B-cell population.  Predominance of lymphocytes and nonspecific changes. -We reviewed results of JAK2 V617F and reflex testing which was negative. -I will do BCR/ABL by FISH prior to next visit.  3.  Rheumatoid arthritis: -Diagnosed in 2013.  He had been on multiple immunosuppressive medications.  Currently on Orencia.

## 2019-07-20 NOTE — Patient Instructions (Signed)
[place] at [facility] Discharge Instructions  You were seen today by Dr. Delton Coombes. He went over your recent lab results. He will schedule you for another IV iron infusion. He will see you back in 2 months for labs and follow up.   Thank you for choosing [place] at [facility] to provide your oncology and hematology care.  To afford each patient quality time with our provider, please arrive at least 15 minutes before your scheduled appointment time.   If you have a lab appointment with the [facility] please come in thru the  Main Entrance and check in at the main information desk  You need to re-schedule your appointment should you arrive 10 or more minutes late.  We strive to give you quality time with our providers, and arriving late affects you and other patients whose appointments are after yours.  Also, if you no show three or more times for appointments you may be dismissed from the clinic at the providers discretion.     Again, thank you for choosing [facility].  Our hope is that these requests will decrease the amount of time that you wait before being seen by our physicians.       _____________________________________________________________  Should you have questions after your visit to [facility], please contact our office at [phone] between the hours of 8:00 a.m. and 4:30 p.m.  Voicemails left after 4:00 p.m. will not be returned until the following business day.  For prescription refill requests, have your pharmacy contact our office and allow 72 hours.    Cancer Center Support Programs:   > Cancer Support Group  2nd Tuesday of the month 1pm-2pm, Journey Room

## 2019-07-22 ENCOUNTER — Ambulatory Visit (HOSPITAL_COMMUNITY): Payer: Medicare Other | Admitting: Hematology

## 2019-07-23 ENCOUNTER — Inpatient Hospital Stay (HOSPITAL_COMMUNITY): Payer: Medicare Other

## 2019-07-23 ENCOUNTER — Other Ambulatory Visit: Payer: Self-pay

## 2019-07-23 ENCOUNTER — Encounter (HOSPITAL_COMMUNITY): Payer: Self-pay

## 2019-07-23 VITALS — BP 130/63 | HR 62 | Temp 97.7°F | Resp 18

## 2019-07-23 DIAGNOSIS — D72829 Elevated white blood cell count, unspecified: Secondary | ICD-10-CM | POA: Diagnosis not present

## 2019-07-23 DIAGNOSIS — N1831 Chronic kidney disease, stage 3a: Secondary | ICD-10-CM

## 2019-07-23 DIAGNOSIS — D649 Anemia, unspecified: Secondary | ICD-10-CM

## 2019-07-23 DIAGNOSIS — Z79899 Other long term (current) drug therapy: Secondary | ICD-10-CM | POA: Diagnosis not present

## 2019-07-23 DIAGNOSIS — M25532 Pain in left wrist: Secondary | ICD-10-CM | POA: Diagnosis not present

## 2019-07-23 DIAGNOSIS — M069 Rheumatoid arthritis, unspecified: Secondary | ICD-10-CM | POA: Diagnosis not present

## 2019-07-23 DIAGNOSIS — D631 Anemia in chronic kidney disease: Secondary | ICD-10-CM | POA: Diagnosis not present

## 2019-07-23 MED ORDER — SODIUM CHLORIDE 0.9 % IV SOLN
510.0000 mg | Freq: Once | INTRAVENOUS | Status: AC
  Administered 2019-07-23: 510 mg via INTRAVENOUS
  Filled 2019-07-23: qty 510

## 2019-07-23 MED ORDER — SODIUM CHLORIDE 0.9 % IV SOLN: Freq: Once | INTRAVENOUS | Status: AC

## 2019-07-23 NOTE — Progress Notes (Signed)
Treatment given per orders. Patient tolerated it well without problems. Vitals stable and discharged home from clinic ambulatory. Follow up as scheduled.  

## 2019-07-23 NOTE — Patient Instructions (Signed)
[facility] at [facility]  Discharge Instructions:   _______________________________________________________________  Thank you for choosing [facility] at [facility] to provide your oncology and hematology care.  To afford each patient quality time with our providers, please arrive at least 15 minutes before your scheduled appointment.  You need to re-schedule your appointment if you arrive 10 or more minutes late.  We strive to give you quality time with our providers, and arriving late affects you and other patients whose appointments are after yours.  Also, if you no show three or more times for appointments you may be dismissed from the clinic.  Again, thank you for choosing [facility] at [facility]. Our hope is that these requests will allow you access to exceptional care and in a timely manner. _______________________________________________________________  If you have questions after your visit, please contact our office at [phone] between the hours of 8:30 a.m. and 5:00 p.m. Voicemails left after 4:30 p.m. will not be returned until the following business day. _______________________________________________________________  For prescription refill requests, have your pharmacy contact our office. _______________________________________________________________  Recommendations made by the consultant and any test results will be sent to your referring physician. _______________________________________________________________ 

## 2019-08-03 DIAGNOSIS — M25462 Effusion, left knee: Secondary | ICD-10-CM | POA: Diagnosis not present

## 2019-08-03 DIAGNOSIS — M069 Rheumatoid arthritis, unspecified: Secondary | ICD-10-CM | POA: Diagnosis not present

## 2019-08-13 DIAGNOSIS — E1121 Type 2 diabetes mellitus with diabetic nephropathy: Secondary | ICD-10-CM | POA: Diagnosis not present

## 2019-08-13 DIAGNOSIS — E1122 Type 2 diabetes mellitus with diabetic chronic kidney disease: Secondary | ICD-10-CM | POA: Diagnosis not present

## 2019-08-13 DIAGNOSIS — I129 Hypertensive chronic kidney disease with stage 1 through stage 4 chronic kidney disease, or unspecified chronic kidney disease: Secondary | ICD-10-CM | POA: Diagnosis not present

## 2019-08-13 DIAGNOSIS — E1165 Type 2 diabetes mellitus with hyperglycemia: Secondary | ICD-10-CM | POA: Diagnosis not present

## 2019-08-13 DIAGNOSIS — E782 Mixed hyperlipidemia: Secondary | ICD-10-CM | POA: Diagnosis not present

## 2019-08-16 DIAGNOSIS — M0589 Other rheumatoid arthritis with rheumatoid factor of multiple sites: Secondary | ICD-10-CM | POA: Diagnosis not present

## 2019-08-17 DIAGNOSIS — E1122 Type 2 diabetes mellitus with diabetic chronic kidney disease: Secondary | ICD-10-CM | POA: Diagnosis not present

## 2019-08-17 DIAGNOSIS — E782 Mixed hyperlipidemia: Secondary | ICD-10-CM | POA: Diagnosis not present

## 2019-08-17 DIAGNOSIS — D649 Anemia, unspecified: Secondary | ICD-10-CM | POA: Diagnosis not present

## 2019-08-17 DIAGNOSIS — I129 Hypertensive chronic kidney disease with stage 1 through stage 4 chronic kidney disease, or unspecified chronic kidney disease: Secondary | ICD-10-CM | POA: Diagnosis not present

## 2019-09-15 ENCOUNTER — Inpatient Hospital Stay (HOSPITAL_COMMUNITY): Payer: Medicare Other | Attending: Hematology

## 2019-09-15 ENCOUNTER — Other Ambulatory Visit: Payer: Self-pay

## 2019-09-15 ENCOUNTER — Inpatient Hospital Stay (HOSPITAL_COMMUNITY): Payer: Medicare Other

## 2019-09-15 DIAGNOSIS — M0589 Other rheumatoid arthritis with rheumatoid factor of multiple sites: Secondary | ICD-10-CM | POA: Diagnosis not present

## 2019-09-15 DIAGNOSIS — M549 Dorsalgia, unspecified: Secondary | ICD-10-CM | POA: Diagnosis not present

## 2019-09-15 DIAGNOSIS — M25462 Effusion, left knee: Secondary | ICD-10-CM | POA: Diagnosis not present

## 2019-09-15 DIAGNOSIS — M25569 Pain in unspecified knee: Secondary | ICD-10-CM | POA: Diagnosis not present

## 2019-09-15 DIAGNOSIS — D649 Anemia, unspecified: Secondary | ICD-10-CM | POA: Diagnosis not present

## 2019-09-15 DIAGNOSIS — M109 Gout, unspecified: Secondary | ICD-10-CM | POA: Diagnosis not present

## 2019-09-15 DIAGNOSIS — N289 Disorder of kidney and ureter, unspecified: Secondary | ICD-10-CM | POA: Diagnosis not present

## 2019-09-15 DIAGNOSIS — Z79899 Other long term (current) drug therapy: Secondary | ICD-10-CM | POA: Diagnosis not present

## 2019-09-15 LAB — CBC WITH DIFFERENTIAL/PLATELET
Abs Immature Granulocytes: 0.07 10*3/uL (ref 0.00–0.07)
Basophils Absolute: 0.1 10*3/uL (ref 0.0–0.1)
Basophils Relative: 1 %
Eosinophils Absolute: 0.8 10*3/uL — ABNORMAL HIGH (ref 0.0–0.5)
Eosinophils Relative: 7 %
HCT: 33.8 % — ABNORMAL LOW (ref 39.0–52.0)
Hemoglobin: 10.7 g/dL — ABNORMAL LOW (ref 13.0–17.0)
Immature Granulocytes: 1 %
Lymphocytes Relative: 26 %
Lymphs Abs: 3 10*3/uL (ref 0.7–4.0)
MCH: 27.6 pg (ref 26.0–34.0)
MCHC: 31.7 g/dL (ref 30.0–36.0)
MCV: 87.1 fL (ref 80.0–100.0)
Monocytes Absolute: 0.7 10*3/uL (ref 0.1–1.0)
Monocytes Relative: 6 %
Neutro Abs: 7 10*3/uL (ref 1.7–7.7)
Neutrophils Relative %: 59 %
Platelets: 331 10*3/uL (ref 150–400)
RBC: 3.88 MIL/uL — ABNORMAL LOW (ref 4.22–5.81)
RDW: 15.7 % — ABNORMAL HIGH (ref 11.5–15.5)
WBC: 11.8 10*3/uL — ABNORMAL HIGH (ref 4.0–10.5)
nRBC: 0 % (ref 0.0–0.2)

## 2019-09-15 LAB — IRON AND TIBC
Iron: 77 ug/dL (ref 45–182)
Saturation Ratios: 29 % (ref 17.9–39.5)
TIBC: 264 ug/dL (ref 250–450)
UIBC: 187 ug/dL

## 2019-09-15 LAB — FERRITIN: Ferritin: 446 ng/mL — ABNORMAL HIGH (ref 24–336)

## 2019-09-22 ENCOUNTER — Ambulatory Visit (HOSPITAL_COMMUNITY): Payer: Medicare Other | Admitting: Nurse Practitioner

## 2019-09-23 LAB — BCR-ABL1 FISH
Cells Analyzed: 200
Cells Counted: 200

## 2019-09-24 ENCOUNTER — Other Ambulatory Visit: Payer: Self-pay

## 2019-09-24 ENCOUNTER — Inpatient Hospital Stay (HOSPITAL_COMMUNITY): Payer: Medicare Other | Attending: Hematology | Admitting: Nurse Practitioner

## 2019-09-24 DIAGNOSIS — D649 Anemia, unspecified: Secondary | ICD-10-CM

## 2019-09-24 DIAGNOSIS — N1831 Chronic kidney disease, stage 3a: Secondary | ICD-10-CM

## 2019-09-24 NOTE — Progress Notes (Signed)
[facility] Follow up:    Kevin Lopez, [place] [address]   DIAGNOSIS: Iron deficiency anemia  CURRENT THERAPY: Intermittent iron infusions  INTERVAL HISTORY: Kevin Lopez 69 y.o. male was called for telephone visit today for iron deficiency anemia.  Patient reports he is doing well since his last visit.  He does report some mild fatigue from time to time and he stays cold.  He denies any bright red bleeding per rectum or melena.  He denies easy bruising or bleeding. Denies any nausea, vomiting, or diarrhea. Denies any new pains. Had not noticed any recent bleeding such as epistaxis, hematuria or hematochezia. Denies recent chest pain on exertion, shortness of breath on minimal exertion, pre-syncopal episodes, or palpitations. Denies any numbness or tingling in hands or feet. Denies any recent fevers, infections, or recent hospitalizations. Patient reports appetite at 100% and energy level at 75%.  He is eating well maintain his weight at this time.    Patient Active Problem List   Diagnosis Date Noted  . Normocytic anemia 06/03/2019  . CKD (chronic kidney disease) stage 3, GFR 30-59 ml/min 06/03/2019  . Cancer of left colon s/p colectomy/ostomy, and ostomy takedown 12/16/2015  . SBO (small bowel obstruction) s/p ex lap in past 12/15/2015  . Leukocytosis 12/15/2015  . Ischemic bowel disease ([place]) 12/15/2015  . Generalized abdominal pain   . Nausea with vomiting   . Hypertension   . RA (rheumatoid arthritis) ([place])   . Diverticulosis     is allergic to latex.  MEDICAL HISTORY: Past Medical History:  Diagnosis Date  . Diverticulosis   . Hypertension   . RA (rheumatoid arthritis) ([place])   . SBO (small bowel obstruction) s/p ex lap in past 12/15/2015    SURGICAL HISTORY: Past Surgical History:  Procedure Laterality Date  . APPENDECTOMY    . COLOSTOMY TAKEDOWN     [place] N/A 12/15/2015    Procedure: FLEXIBLE SIGMOIDOSCOPY;  Surgeon: Danie Binder, MD;  Location[facility];  Service: Endoscopy;  Laterality: N/A;  . KNEE SURGERY    . LEFT COLECTOMY     [place]  . LYSIS OF ADHESION     [place]    SOCIAL HISTORY: Social History   Socioeconomic History  . Marital status: Single    Spouse name: Not on file  . Number of children: 3  . Years of education: Not on file  . Highest education level: Not on file  Occupational History  . Occupation: retired  Tobacco Use  . Smoking status: Never Smoker  . Smokeless tobacco: Never Used  Substance and Sexual Activity  . Alcohol use: Yes    Comment: occ.   . Drug use: No  . Sexual activity: Not on file  Other Topics Concern  . Not on file  Social History Narrative  . Not on file   Social Determinants of Health   Financial Resource Strain:   . Difficulty of Paying Living Expenses:   Food Insecurity:   . Worried About Charity fundraiser in the Last Year:   . Arboriculturist in the Last Year:   Transportation Needs:   . Film/video editor (Medical):   Marland Kitchen Lack of Transportation (Non-Medical):   Physical Activity:   . Days of Exercise per Week:   . Minutes of Exercise per Session:   Stress:   . Feeling of Stress :   Social Connections:   .  Frequency of Communication with Friends and Family:   . Frequency of Social Gatherings with Friends and Family:   . Attends Religious Services:   . Active Member of Clubs or Organizations:   . Attends Archivist Meetings:   Marland Kitchen Marital Status:   Intimate Partner Violence:   . Fear of Current or Ex-Partner:   . Emotionally Abused:   Marland Kitchen Physically Abused:   . Sexually Abused:     FAMILY HISTORY: Family History  Problem Relation Age of Onset  . Heart attack Father   . Cancer Mother   . Hypertension Brother   . Hypertension Sister     Review of Systems  All other systems reviewed and are negative.   Vital signs: -Deferred due to telephone visit  Physical  Exam -Deferred due to telephone visit -Patient was alert and oriented over the phone and in no acute distress   LABORATORY DATA:  CBC    Component Value Date/Time   WBC 11.8 (H) 09/15/2019 1023   RBC 3.88 (L) 09/15/2019 1023   HGB 10.7 (L) 09/15/2019 1023   HCT 33.8 (L) 09/15/2019 1023   PLT 331 09/15/2019 1023   MCV 87.1 09/15/2019 1023   MCH 27.6 09/15/2019 1023   MCHC 31.7 09/15/2019 1023   RDW 15.7 (H) 09/15/2019 1023   LYMPHSABS 3.0 09/15/2019 1023   MONOABS 0.7 09/15/2019 1023   EOSABS 0.8 (H) 09/15/2019 1023   BASOSABS 0.1 09/15/2019 1023    CMP     Component Value Date/Time   NA 137 06/02/2019 1439   K 4.4 06/02/2019 1439   CL 104 06/02/2019 1439   CO2 25 06/02/2019 1439   GLUCOSE 131 (H) 06/02/2019 1439   BUN 22 06/02/2019 1439   CREATININE 1.45 (H) 06/02/2019 1439   CALCIUM 9.3 06/02/2019 1439   PROT 7.6 06/02/2019 1439   ALBUMIN 4.3 06/02/2019 1439   AST 16 06/02/2019 1439   ALT 14 06/02/2019 1439   ALKPHOS 55 06/02/2019 1439   BILITOT 0.5 06/02/2019 1439   GFRNONAA 49 (L) 06/02/2019 1439   GFRAA 57 (L) 06/02/2019 1439   All questions were answered to patient's stated satisfaction. Encouraged patient to call with any new concerns or questions before his next visit to the cancer center and we can certain see him sooner, if needed.     ASSESSMENT and THERAPY PLAN:   CKD (chronic kidney disease) stage 3, GFR 30-59 ml/min 1.  Normocytic anemia: -Combination anemia from CKD and relative iron deficiency. -Last 2 iron infusions on 06/07/2019 and 06/18/2019. -Stool for occult blood was negative. -He denies any bright red bleeding per rectum or melena. -She does report improvement in energy levels from iron infusions. -Labs done on 09/15/2019 showed hemoglobin 10.7, ferritin 446, percent saturation 29. -He does not need any IV iron at this time. -He will follow-up in 3 months with repeat labs.  2.  Leukocytosis: -CBC on 07/14/2019 shows white count of 12.2.   Absolute neutrophil count is 9.2.  Platelet count is normal. -Flow cytometry on 05/12/2019 did not show any monoclonal B-cell population.  Predominance of lymphocytes and nonspecific changes. -We reviewed his results of Jak 2 V6 58F and reflex testing which was negative. -BCR/ABL by FISH was also negative. -Labs done on 09/15/2019 showed WBC 11.8. -We will continue to monitor.  3.  Rheumatoid arthritis: -Diagnosed in 2013. -She has been on multiple immunosuppressive medications. -Currently on Orencia.   Orders Placed This Encounter  Procedures  . Lactate dehydrogenase  Standing Status:   Future    Standing Expiration Date:   09/23/2020  . CBC with Differential/Platelet    Standing Status:   Future    Standing Expiration Date:   09/23/2020  . Comprehensive metabolic panel    Standing Status:   Future    Standing Expiration Date:   09/23/2020  . Ferritin    Standing Status:   Future    Standing Expiration Date:   09/23/2020  . Iron and TIBC    Standing Status:   Future    Standing Expiration Date:   09/23/2020  . Vitamin B12    Standing Status:   Future    Standing Expiration Date:   09/23/2020  . VITAMIN D 25 Hydroxy (Vit-D Deficiency, Fractures)    Standing Status:   Future    Standing Expiration Date:   09/23/2020  . Folate    Standing Status:   Future    Standing Expiration Date:   09/23/2020    All questions were answered. The patient knows to call the clinic with any problems, questions or concerns. We can certainly see the patient much sooner if necessary. This note was electronically signed.  I provided 30 minutes of non face-to-face telephone visit time during this encounter, and > 50% was spent counseling as documented under my assessment & plan.   Glennie Isle, NP-C 09/24/2019

## 2019-09-24 NOTE — Assessment & Plan Note (Signed)
1.  Normocytic anemia: -Combination anemia from CKD and relative iron deficiency. -Last 2 iron infusions on 06/07/2019 and 06/18/2019. -Stool for occult blood was negative. -He denies any bright red bleeding per rectum or melena. -She does report improvement in energy levels from iron infusions. -Labs done on 09/15/2019 showed hemoglobin 10.7, ferritin 446, percent saturation 29. -He does not need any IV iron at this time. -He will follow-up in 3 months with repeat labs.  2.  Leukocytosis: -CBC on 07/14/2019 shows white count of 12.2.  Absolute neutrophil count is 9.2.  Platelet count is normal. -Flow cytometry on 05/12/2019 did not show any monoclonal B-cell population.  Predominance of lymphocytes and nonspecific changes. -We reviewed his results of Jak 2 V6 71F and reflex testing which was negative. -BCR/ABL by FISH was also negative. -Labs done on 09/15/2019 showed WBC 11.8. -We will continue to monitor.  3.  Rheumatoid arthritis: -Diagnosed in 2013. -She has been on multiple immunosuppressive medications. -Currently on Orencia.

## 2019-11-26 DIAGNOSIS — L738 Other specified follicular disorders: Secondary | ICD-10-CM | POA: Diagnosis not present

## 2019-11-26 DIAGNOSIS — B360 Pityriasis versicolor: Secondary | ICD-10-CM | POA: Diagnosis not present

## 2019-11-26 DIAGNOSIS — B356 Tinea cruris: Secondary | ICD-10-CM | POA: Diagnosis not present

## 2019-11-26 DIAGNOSIS — L700 Acne vulgaris: Secondary | ICD-10-CM | POA: Diagnosis not present

## 2019-11-30 DIAGNOSIS — M19042 Primary osteoarthritis, left hand: Secondary | ICD-10-CM | POA: Diagnosis not present

## 2019-11-30 DIAGNOSIS — M0589 Other rheumatoid arthritis with rheumatoid factor of multiple sites: Secondary | ICD-10-CM | POA: Diagnosis not present

## 2019-11-30 DIAGNOSIS — M19041 Primary osteoarthritis, right hand: Secondary | ICD-10-CM | POA: Diagnosis not present

## 2019-11-30 DIAGNOSIS — M79642 Pain in left hand: Secondary | ICD-10-CM | POA: Diagnosis not present

## 2019-11-30 DIAGNOSIS — M79641 Pain in right hand: Secondary | ICD-10-CM | POA: Diagnosis not present

## 2019-12-06 DIAGNOSIS — M05841 Other rheumatoid arthritis with rheumatoid factor of right hand: Secondary | ICD-10-CM | POA: Diagnosis not present

## 2019-12-06 DIAGNOSIS — M05842 Other rheumatoid arthritis with rheumatoid factor of left hand: Secondary | ICD-10-CM | POA: Diagnosis not present

## 2019-12-09 ENCOUNTER — Inpatient Hospital Stay (HOSPITAL_COMMUNITY): Payer: Medicare Other | Attending: Hematology

## 2019-12-09 ENCOUNTER — Other Ambulatory Visit: Payer: Self-pay

## 2019-12-09 DIAGNOSIS — N1831 Chronic kidney disease, stage 3a: Secondary | ICD-10-CM | POA: Diagnosis not present

## 2019-12-09 DIAGNOSIS — D631 Anemia in chronic kidney disease: Secondary | ICD-10-CM | POA: Insufficient documentation

## 2019-12-09 DIAGNOSIS — D649 Anemia, unspecified: Secondary | ICD-10-CM

## 2019-12-09 LAB — COMPREHENSIVE METABOLIC PANEL
ALT: 22 U/L (ref 0–44)
AST: 19 U/L (ref 15–41)
Albumin: 4.1 g/dL (ref 3.5–5.0)
Alkaline Phosphatase: 54 U/L (ref 38–126)
Anion gap: 10 (ref 5–15)
BUN: 24 mg/dL — ABNORMAL HIGH (ref 8–23)
CO2: 23 mmol/L (ref 22–32)
Calcium: 9.1 mg/dL (ref 8.9–10.3)
Chloride: 106 mmol/L (ref 98–111)
Creatinine, Ser: 1.17 mg/dL (ref 0.61–1.24)
GFR calc Af Amer: >60 mL/min (ref >60)
GFR calc non Af Amer: >60 mL/min (ref >60)
Glucose, Bld: 70 mg/dL (ref 70–99)
Potassium: 4 mmol/L (ref 3.5–5.1)
Sodium: 139 mmol/L (ref 135–145)
Total Bilirubin: 0.4 mg/dL (ref 0.3–1.2)
Total Protein: 7.3 g/dL (ref 6.5–8.1)

## 2019-12-09 LAB — CBC WITH DIFFERENTIAL/PLATELET
Abs Immature Granulocytes: 0.28 10*3/uL — ABNORMAL HIGH (ref 0.00–0.07)
Basophils Absolute: 0.1 10*3/uL (ref 0.0–0.1)
Basophils Relative: 1 %
Eosinophils Absolute: 0.4 10*3/uL (ref 0.0–0.5)
Eosinophils Relative: 2 %
HCT: 34.1 % — ABNORMAL LOW (ref 39.0–52.0)
Hemoglobin: 10.9 g/dL — ABNORMAL LOW (ref 13.0–17.0)
Immature Granulocytes: 1 %
Lymphocytes Relative: 24 %
Lymphs Abs: 5.4 10*3/uL — ABNORMAL HIGH (ref 0.7–4.0)
MCH: 27.5 pg (ref 26.0–34.0)
MCHC: 32 g/dL (ref 30.0–36.0)
MCV: 86.1 fL (ref 80.0–100.0)
Monocytes Absolute: 1.5 10*3/uL — ABNORMAL HIGH (ref 0.1–1.0)
Monocytes Relative: 7 %
Neutro Abs: 14.5 10*3/uL — ABNORMAL HIGH (ref 1.7–7.7)
Neutrophils Relative %: 65 %
Platelets: 403 10*3/uL — ABNORMAL HIGH (ref 150–400)
RBC: 3.96 MIL/uL — ABNORMAL LOW (ref 4.22–5.81)
RDW: 14.2 % (ref 11.5–15.5)
WBC: 22.1 10*3/uL — ABNORMAL HIGH (ref 4.0–10.5)
nRBC: 0 % (ref 0.0–0.2)

## 2019-12-09 LAB — IRON AND TIBC
Iron: 89 ug/dL (ref 45–182)
Saturation Ratios: 29 % (ref 17.9–39.5)
TIBC: 310 ug/dL (ref 250–450)
UIBC: 221 ug/dL

## 2019-12-09 LAB — LACTATE DEHYDROGENASE: LDH: 125 U/L (ref 98–192)

## 2019-12-09 LAB — FERRITIN: Ferritin: 421 ng/mL — ABNORMAL HIGH (ref 24–336)

## 2019-12-09 LAB — VITAMIN B12: Vitamin B-12: 367 pg/mL (ref 180–914)

## 2019-12-09 LAB — VITAMIN D 25 HYDROXY (VIT D DEFICIENCY, FRACTURES): Vit D, 25-Hydroxy: 28.85 ng/mL — ABNORMAL LOW (ref 30–100)

## 2019-12-09 LAB — FOLATE: Folate: 11.1 ng/mL (ref >5.9)

## 2019-12-14 DIAGNOSIS — H903 Sensorineural hearing loss, bilateral: Secondary | ICD-10-CM | POA: Diagnosis not present

## 2019-12-14 DIAGNOSIS — H9113 Presbycusis, bilateral: Secondary | ICD-10-CM | POA: Diagnosis not present

## 2019-12-15 DIAGNOSIS — Z79899 Other long term (current) drug therapy: Secondary | ICD-10-CM | POA: Diagnosis not present

## 2019-12-15 DIAGNOSIS — M79646 Pain in unspecified finger(s): Secondary | ICD-10-CM | POA: Diagnosis not present

## 2019-12-15 DIAGNOSIS — N289 Disorder of kidney and ureter, unspecified: Secondary | ICD-10-CM | POA: Diagnosis not present

## 2019-12-15 DIAGNOSIS — M7989 Other specified soft tissue disorders: Secondary | ICD-10-CM | POA: Diagnosis not present

## 2019-12-15 DIAGNOSIS — M549 Dorsalgia, unspecified: Secondary | ICD-10-CM | POA: Diagnosis not present

## 2019-12-15 DIAGNOSIS — M25549 Pain in joints of unspecified hand: Secondary | ICD-10-CM | POA: Diagnosis not present

## 2019-12-15 DIAGNOSIS — M79642 Pain in left hand: Secondary | ICD-10-CM | POA: Diagnosis not present

## 2019-12-15 DIAGNOSIS — M0589 Other rheumatoid arthritis with rheumatoid factor of multiple sites: Secondary | ICD-10-CM | POA: Diagnosis not present

## 2019-12-15 DIAGNOSIS — M109 Gout, unspecified: Secondary | ICD-10-CM | POA: Diagnosis not present

## 2019-12-16 ENCOUNTER — Inpatient Hospital Stay (HOSPITAL_BASED_OUTPATIENT_CLINIC_OR_DEPARTMENT_OTHER): Payer: Medicare Other | Admitting: Nurse Practitioner

## 2019-12-16 DIAGNOSIS — N1831 Chronic kidney disease, stage 3a: Secondary | ICD-10-CM | POA: Diagnosis not present

## 2019-12-16 DIAGNOSIS — D649 Anemia, unspecified: Secondary | ICD-10-CM

## 2019-12-16 DIAGNOSIS — D72829 Elevated white blood cell count, unspecified: Secondary | ICD-10-CM

## 2019-12-16 NOTE — Progress Notes (Signed)
[facility] Follow up:    Kevin Lopez, [place] [address]   DIAGNOSIS: Iron deficiency anemia and leukocytosis  CURRENT THERAPY: Observation  INTERVAL HISTORY: Kevin Lopez 69 y.o. male was called for a telephone visit for iron deficiency anemia and leukocytosis.  Patient reports he has been doing well since his last visit.  He reports he recently had a on RA flareup and is on antibiotics and prednisone currently.  He denies any other infection.  He denies any bright red bleeding per rectum or melena.  He denies any easy bruising or bleeding.  Denies any nausea, vomiting, or diarrhea. Denies any new pains. Had not noticed any recent bleeding such as epistaxis, hematuria or hematochezia. Denies recent chest pain on exertion, shortness of breath on minimal exertion, pre-syncopal episodes, or palpitations. Denies any numbness or tingling in hands or feet. Denies any recent fevers, infections, or recent hospitalizations. Patient reports appetite at 100% and energy level at 100%.   Patient Active Problem List   Diagnosis Date Noted  . Normocytic anemia 06/03/2019  . CKD (chronic kidney disease) stage 3, GFR 30-59 ml/min 06/03/2019  . Cancer of left colon s/p colectomy/ostomy, and ostomy takedown 12/16/2015  . SBO (small bowel obstruction) s/p ex lap in past 12/15/2015  . Leukocytosis 12/15/2015  . Ischemic bowel disease ([place]) 12/15/2015  . Generalized abdominal pain   . Nausea with vomiting   . Hypertension   . RA (rheumatoid arthritis) ([place])   . Diverticulosis     is allergic to latex.  MEDICAL HISTORY: Past Medical History:  Diagnosis Date  . Diverticulosis   . Hypertension   . RA (rheumatoid arthritis) ([place])   . SBO (small bowel obstruction) s/p ex lap in past 12/15/2015    SURGICAL HISTORY: Past Surgical History:  Procedure Laterality Date  . APPENDECTOMY    . COLOSTOMY TAKEDOWN     [place] N/A 12/15/2015   Procedure: FLEXIBLE SIGMOIDOSCOPY;  Surgeon: Danie Binder, MD;  [facility];  Service: Endoscopy;  Laterality: N/A;  . KNEE SURGERY    . LEFT COLECTOMY     [place]  . LYSIS OF ADHESION     SBO    SOCIAL HISTORY: Social History   Socioeconomic History  . Marital status: Single    Spouse name: Not on file  . Number of children: 3  . Years of education: Not on file  . Highest education level: Not on file  Occupational History  . Occupation: retired  Tobacco Use  . Smoking status: Never Smoker  . Smokeless tobacco: Never Used  Substance and Sexual Activity  . Alcohol use: Yes    Comment: occ.   . Drug use: No  . Sexual activity: Not on file  Other Topics Concern  . Not on file  Social History Narrative  . Not on file   Social Determinants of Health   Financial Resource Strain:   . Difficulty of Paying Living Expenses:   Food Insecurity:   . Worried About Charity fundraiser in the Last Year:   . Arboriculturist in the Last Year:   Transportation Needs:   . Film/video editor (Medical):   Marland Kitchen Lack of Transportation (Non-Medical):   Physical Activity:   . Days of Exercise per Week:   . Minutes of Exercise per Session:   Stress:   . Feeling of Stress :   Social Connections:   .  Frequency of Communication with Friends and Family:   . Frequency of Social Gatherings with Friends and Family:   . Attends Religious Services:   . Active Member of Clubs or Organizations:   . Attends Archivist Meetings:   Marland Kitchen Marital Status:   Intimate Partner Violence:   . Fear of Current or Ex-Partner:   . Emotionally Abused:   Marland Kitchen Physically Abused:   . Sexually Abused:     FAMILY HISTORY: Family History  Problem Relation Age of Onset  . Heart attack Father   . Cancer Mother   . Hypertension Brother   . Hypertension Sister     Review of Systems  All other systems reviewed and are negative.   Vital signs: -Deferred due to  telephone visit  Physical Exam -Deferred due to telephone visit -Patient was alert and oriented over the phone and in no acute distress   LABORATORY DATA:  CBC    Component Value Date/Time   WBC 22.1 (H) 12/09/2019 1439   RBC 3.96 (L) 12/09/2019 1439   HGB 10.9 (L) 12/09/2019 1439   HCT 34.1 (L) 12/09/2019 1439   PLT 403 (H) 12/09/2019 1439   MCV 86.1 12/09/2019 1439   MCH 27.5 12/09/2019 1439   MCHC 32.0 12/09/2019 1439   RDW 14.2 12/09/2019 1439   LYMPHSABS 5.4 (H) 12/09/2019 1439   MONOABS 1.5 (H) 12/09/2019 1439   EOSABS 0.4 12/09/2019 1439   BASOSABS 0.1 12/09/2019 1439    CMP     Component Value Date/Time   NA 139 12/09/2019 1439   K 4.0 12/09/2019 1439   CL 106 12/09/2019 1439   CO2 23 12/09/2019 1439   GLUCOSE 70 12/09/2019 1439   BUN 24 (H) 12/09/2019 1439   CREATININE 1.17 12/09/2019 1439   CALCIUM 9.1 12/09/2019 1439   PROT 7.3 12/09/2019 1439   ALBUMIN 4.1 12/09/2019 1439   AST 19 12/09/2019 1439   ALT 22 12/09/2019 1439   ALKPHOS 54 12/09/2019 1439   BILITOT 0.4 12/09/2019 1439   GFRNONAA >60 12/09/2019 1439   GFRAA >60 12/09/2019 1439    All questions were answered to patient's stated satisfaction. Encouraged patient to call with any new concerns or questions before his next visit to the cancer center and we can certain see him sooner, if needed.     ASSESSMENT and THERAPY PLAN:   CKD (chronic kidney disease) stage 3, GFR 30-59 ml/min 1.  Normocytic anemia: -Combination anemia from CKD and relative iron deficiency. -Last 2 iron infusions on 06/07/2019 and 06/18/2019. -Stool for occult blood was negative. -He denies any bright red bleeding per rectum or melena. -She does report improvement in energy levels from iron infusions. -Labs done on 12/09/2019 showed hemoglobin 10.9, ferritin 421, percent saturation 29 -He does not need any IV iron at this time. -He will follow-up in 3 months with repeat labs.  2.  Leukocytosis: -CBC on 07/14/2019  shows white count of 12.2.  Absolute neutrophil count is 9.2.  Platelet count is normal. -Flow cytometry on 05/12/2019 did not show any monoclonal B-cell population.  Predominance of lymphocytes and nonspecific changes. -We reviewed his results of Jak 2 V6 76F and reflex testing which was negative. -BCR/ABL by FISH was also negative. -Labs done on 12/09/2019 showed WBC 22.1 -Patient has had a RF flareup he was placed on antibiotics and prednisone 2 weeks ago.  He also had a prednisone injection on 12/15/2019 -We will continue to monitor and recheck his labs in 3 months.  3.  Rheumatoid arthritis: -Diagnosed in 2013. -She has been on multiple immunosuppressive medications. -Currently on Orencia. -She has recently had an RA flareup he is placed on antibiotics and prednisone 2 weeks ago.   Orders Placed This Encounter  Procedures  . Lactate dehydrogenase    Standing Status:   Future    Standing Expiration Date:   12/15/2020  . CBC with Differential/Platelet    Standing Status:   Future    Standing Expiration Date:   12/15/2020  . Comprehensive metabolic panel    Standing Status:   Future    Standing Expiration Date:   12/15/2020  . Ferritin    Standing Status:   Future    Standing Expiration Date:   12/15/2020  . Iron and TIBC    Standing Status:   Future    Standing Expiration Date:   12/15/2020  . Vitamin B12    Standing Status:   Future    Standing Expiration Date:   12/15/2020  . VITAMIN D 25 Hydroxy (Vit-D Deficiency, Fractures)    Standing Status:   Future    Standing Expiration Date:   12/15/2020  . Folate    Standing Status:   Future    Standing Expiration Date:   12/15/2020    All questions were answered. The patient knows to call the clinic with any problems, questions or concerns. We can certainly see the patient much sooner if necessary. This note was electronically signed.  I provided 30 minutes of non face-to-face telephone visit time during this encounter, and > 50%  was spent counseling as documented under my assessment & plan.   Glennie Isle, NP-C 12/16/2019

## 2019-12-16 NOTE — Assessment & Plan Note (Signed)
1.  Normocytic anemia: -Combination anemia from CKD and relative iron deficiency. -Last 2 iron infusions on 06/07/2019 and 06/18/2019. -Stool for occult blood was negative. -He denies any bright red bleeding per rectum or melena. -She does report improvement in energy levels from iron infusions. -Labs done on 12/09/2019 showed hemoglobin 10.9, ferritin 421, percent saturation 29 -He does not need any IV iron at this time. -He will follow-up in 3 months with repeat labs.  2.  Leukocytosis: -CBC on 07/14/2019 shows white count of 12.2.  Absolute neutrophil count is 9.2.  Platelet count is normal. -Flow cytometry on 05/12/2019 did not show any monoclonal B-cell population.  Predominance of lymphocytes and nonspecific changes. -We reviewed his results of Jak 2 V6 32F and reflex testing which was negative. -BCR/ABL by FISH was also negative. -Labs done on 12/09/2019 showed WBC 22.1 -Patient has had a RF flareup he was placed on antibiotics and prednisone 2 weeks ago.  He also had a prednisone injection on 12/15/2019 -We will continue to monitor and recheck his labs in 3 months.  3.  Rheumatoid arthritis: -Diagnosed in 2013. -She has been on multiple immunosuppressive medications. -Currently on Orencia. -She has recently had an RA flareup he is placed on antibiotics and prednisone 2 weeks ago.

## 2019-12-17 DIAGNOSIS — L700 Acne vulgaris: Secondary | ICD-10-CM | POA: Diagnosis not present

## 2019-12-17 DIAGNOSIS — L732 Hidradenitis suppurativa: Secondary | ICD-10-CM | POA: Diagnosis not present

## 2019-12-17 DIAGNOSIS — B360 Pityriasis versicolor: Secondary | ICD-10-CM | POA: Diagnosis not present

## 2019-12-21 DIAGNOSIS — M109 Gout, unspecified: Secondary | ICD-10-CM | POA: Diagnosis not present

## 2019-12-21 DIAGNOSIS — M79646 Pain in unspecified finger(s): Secondary | ICD-10-CM | POA: Diagnosis not present

## 2019-12-21 DIAGNOSIS — M79645 Pain in left finger(s): Secondary | ICD-10-CM | POA: Diagnosis not present

## 2019-12-21 DIAGNOSIS — Z79899 Other long term (current) drug therapy: Secondary | ICD-10-CM | POA: Diagnosis not present

## 2019-12-21 DIAGNOSIS — M7989 Other specified soft tissue disorders: Secondary | ICD-10-CM | POA: Diagnosis not present

## 2019-12-21 DIAGNOSIS — M79642 Pain in left hand: Secondary | ICD-10-CM | POA: Diagnosis not present

## 2019-12-21 DIAGNOSIS — M549 Dorsalgia, unspecified: Secondary | ICD-10-CM | POA: Diagnosis not present

## 2019-12-21 DIAGNOSIS — E1122 Type 2 diabetes mellitus with diabetic chronic kidney disease: Secondary | ICD-10-CM | POA: Diagnosis not present

## 2019-12-21 DIAGNOSIS — I129 Hypertensive chronic kidney disease with stage 1 through stage 4 chronic kidney disease, or unspecified chronic kidney disease: Secondary | ICD-10-CM | POA: Diagnosis not present

## 2019-12-21 DIAGNOSIS — E782 Mixed hyperlipidemia: Secondary | ICD-10-CM | POA: Diagnosis not present

## 2019-12-21 DIAGNOSIS — M0589 Other rheumatoid arthritis with rheumatoid factor of multiple sites: Secondary | ICD-10-CM | POA: Diagnosis not present

## 2019-12-21 DIAGNOSIS — N289 Disorder of kidney and ureter, unspecified: Secondary | ICD-10-CM | POA: Diagnosis not present

## 2019-12-28 DIAGNOSIS — E782 Mixed hyperlipidemia: Secondary | ICD-10-CM | POA: Diagnosis not present

## 2019-12-28 DIAGNOSIS — I129 Hypertensive chronic kidney disease with stage 1 through stage 4 chronic kidney disease, or unspecified chronic kidney disease: Secondary | ICD-10-CM | POA: Diagnosis not present

## 2019-12-28 DIAGNOSIS — D649 Anemia, unspecified: Secondary | ICD-10-CM | POA: Diagnosis not present

## 2019-12-28 DIAGNOSIS — E1165 Type 2 diabetes mellitus with hyperglycemia: Secondary | ICD-10-CM | POA: Diagnosis not present

## 2019-12-28 DIAGNOSIS — E1122 Type 2 diabetes mellitus with diabetic chronic kidney disease: Secondary | ICD-10-CM | POA: Diagnosis not present

## 2019-12-28 DIAGNOSIS — E1121 Type 2 diabetes mellitus with diabetic nephropathy: Secondary | ICD-10-CM | POA: Diagnosis not present

## 2019-12-29 DIAGNOSIS — M0589 Other rheumatoid arthritis with rheumatoid factor of multiple sites: Secondary | ICD-10-CM | POA: Diagnosis not present

## 2020-01-03 ENCOUNTER — Ambulatory Visit: Payer: Self-pay

## 2020-01-03 DIAGNOSIS — Z23 Encounter for immunization: Secondary | ICD-10-CM | POA: Diagnosis not present

## 2020-01-12 DIAGNOSIS — Z20828 Contact with and (suspected) exposure to other viral communicable diseases: Secondary | ICD-10-CM | POA: Diagnosis not present

## 2020-01-13 DIAGNOSIS — L732 Hidradenitis suppurativa: Secondary | ICD-10-CM | POA: Diagnosis not present

## 2020-02-07 DIAGNOSIS — J302 Other seasonal allergic rhinitis: Secondary | ICD-10-CM | POA: Diagnosis not present

## 2020-02-07 DIAGNOSIS — M0589 Other rheumatoid arthritis with rheumatoid factor of multiple sites: Secondary | ICD-10-CM | POA: Diagnosis not present

## 2020-03-01 DIAGNOSIS — Z96659 Presence of unspecified artificial knee joint: Secondary | ICD-10-CM | POA: Diagnosis not present

## 2020-03-01 DIAGNOSIS — M069 Rheumatoid arthritis, unspecified: Secondary | ICD-10-CM | POA: Diagnosis not present

## 2020-03-01 DIAGNOSIS — M47816 Spondylosis without myelopathy or radiculopathy, lumbar region: Secondary | ICD-10-CM | POA: Diagnosis not present

## 2020-03-01 DIAGNOSIS — M25462 Effusion, left knee: Secondary | ICD-10-CM | POA: Diagnosis not present

## 2020-03-01 DIAGNOSIS — M5432 Sciatica, left side: Secondary | ICD-10-CM | POA: Diagnosis not present

## 2020-03-07 DIAGNOSIS — N35919 Unspecified urethral stricture, male, unspecified site: Secondary | ICD-10-CM | POA: Diagnosis not present

## 2020-03-07 DIAGNOSIS — R7309 Other abnormal glucose: Secondary | ICD-10-CM | POA: Diagnosis not present

## 2020-03-07 DIAGNOSIS — L732 Hidradenitis suppurativa: Secondary | ICD-10-CM | POA: Diagnosis not present

## 2020-03-07 DIAGNOSIS — Z125 Encounter for screening for malignant neoplasm of prostate: Secondary | ICD-10-CM | POA: Diagnosis not present

## 2020-03-07 DIAGNOSIS — E291 Testicular hypofunction: Secondary | ICD-10-CM | POA: Diagnosis not present

## 2020-03-07 DIAGNOSIS — R351 Nocturia: Secondary | ICD-10-CM | POA: Diagnosis not present

## 2020-03-07 DIAGNOSIS — R350 Frequency of micturition: Secondary | ICD-10-CM | POA: Diagnosis not present

## 2020-03-07 DIAGNOSIS — N401 Enlarged prostate with lower urinary tract symptoms: Secondary | ICD-10-CM | POA: Diagnosis not present

## 2020-03-07 DIAGNOSIS — R972 Elevated prostate specific antigen [PSA]: Secondary | ICD-10-CM | POA: Diagnosis not present

## 2020-03-07 DIAGNOSIS — R3915 Urgency of urination: Secondary | ICD-10-CM | POA: Diagnosis not present

## 2020-03-07 DIAGNOSIS — N500 Atrophy of testis: Secondary | ICD-10-CM | POA: Diagnosis not present

## 2020-03-07 DIAGNOSIS — N529 Male erectile dysfunction, unspecified: Secondary | ICD-10-CM | POA: Diagnosis not present

## 2020-03-08 ENCOUNTER — Inpatient Hospital Stay (HOSPITAL_COMMUNITY): Payer: Medicare Other

## 2020-03-08 DIAGNOSIS — R972 Elevated prostate specific antigen [PSA]: Secondary | ICD-10-CM | POA: Diagnosis not present

## 2020-03-14 ENCOUNTER — Ambulatory Visit (HOSPITAL_COMMUNITY): Payer: Medicare Other | Admitting: Hematology

## 2020-03-30 ENCOUNTER — Ambulatory Visit (HOSPITAL_COMMUNITY): Payer: Medicare Other | Admitting: Hematology

## 2020-03-30 ENCOUNTER — Inpatient Hospital Stay (HOSPITAL_COMMUNITY): Payer: Medicare Other

## 2020-03-30 DIAGNOSIS — M25562 Pain in left knee: Secondary | ICD-10-CM | POA: Diagnosis not present

## 2020-03-30 DIAGNOSIS — M79605 Pain in left leg: Secondary | ICD-10-CM | POA: Diagnosis not present

## 2020-03-30 DIAGNOSIS — R2242 Localized swelling, mass and lump, left lower limb: Secondary | ICD-10-CM | POA: Diagnosis not present

## 2020-03-30 DIAGNOSIS — I444 Left anterior fascicular block: Secondary | ICD-10-CM | POA: Diagnosis not present

## 2020-03-30 DIAGNOSIS — M25462 Effusion, left knee: Secondary | ICD-10-CM | POA: Diagnosis not present

## 2020-03-30 DIAGNOSIS — Z79899 Other long term (current) drug therapy: Secondary | ICD-10-CM | POA: Diagnosis not present

## 2020-03-30 DIAGNOSIS — D72829 Elevated white blood cell count, unspecified: Secondary | ICD-10-CM | POA: Diagnosis not present

## 2020-03-30 DIAGNOSIS — M069 Rheumatoid arthritis, unspecified: Secondary | ICD-10-CM | POA: Diagnosis not present

## 2020-04-01 DIAGNOSIS — I517 Cardiomegaly: Secondary | ICD-10-CM | POA: Diagnosis not present

## 2020-04-01 DIAGNOSIS — R079 Chest pain, unspecified: Secondary | ICD-10-CM | POA: Diagnosis not present

## 2020-04-03 DIAGNOSIS — M009 Pyogenic arthritis, unspecified: Secondary | ICD-10-CM | POA: Diagnosis not present

## 2020-04-03 DIAGNOSIS — N289 Disorder of kidney and ureter, unspecified: Secondary | ICD-10-CM | POA: Diagnosis not present

## 2020-04-03 DIAGNOSIS — M25562 Pain in left knee: Secondary | ICD-10-CM | POA: Diagnosis not present

## 2020-04-03 DIAGNOSIS — M109 Gout, unspecified: Secondary | ICD-10-CM | POA: Diagnosis not present

## 2020-04-03 DIAGNOSIS — M0589 Other rheumatoid arthritis with rheumatoid factor of multiple sites: Secondary | ICD-10-CM | POA: Diagnosis not present

## 2020-04-03 DIAGNOSIS — M549 Dorsalgia, unspecified: Secondary | ICD-10-CM | POA: Diagnosis not present

## 2020-04-03 DIAGNOSIS — Z79899 Other long term (current) drug therapy: Secondary | ICD-10-CM | POA: Diagnosis not present

## 2020-04-05 DIAGNOSIS — M25562 Pain in left knee: Secondary | ICD-10-CM | POA: Diagnosis not present

## 2020-04-10 DIAGNOSIS — M25562 Pain in left knee: Secondary | ICD-10-CM | POA: Diagnosis not present

## 2020-04-11 DIAGNOSIS — M0589 Other rheumatoid arthritis with rheumatoid factor of multiple sites: Secondary | ICD-10-CM | POA: Diagnosis not present

## 2020-04-18 ENCOUNTER — Other Ambulatory Visit: Payer: Self-pay

## 2020-04-18 ENCOUNTER — Inpatient Hospital Stay (HOSPITAL_COMMUNITY): Payer: Medicare Other | Attending: Hematology | Admitting: Hematology

## 2020-04-18 ENCOUNTER — Inpatient Hospital Stay (HOSPITAL_COMMUNITY): Payer: Medicare Other

## 2020-04-18 ENCOUNTER — Other Ambulatory Visit (HOSPITAL_COMMUNITY): Payer: Medicare Other

## 2020-04-18 VITALS — BP 153/72 | HR 70 | Temp 98.6°F | Resp 16 | Wt 171.3 lb

## 2020-04-18 DIAGNOSIS — Z23 Encounter for immunization: Secondary | ICD-10-CM | POA: Insufficient documentation

## 2020-04-18 DIAGNOSIS — N189 Chronic kidney disease, unspecified: Secondary | ICD-10-CM | POA: Diagnosis not present

## 2020-04-18 DIAGNOSIS — D649 Anemia, unspecified: Secondary | ICD-10-CM

## 2020-04-18 DIAGNOSIS — M2550 Pain in unspecified joint: Secondary | ICD-10-CM | POA: Insufficient documentation

## 2020-04-18 DIAGNOSIS — D72829 Elevated white blood cell count, unspecified: Secondary | ICD-10-CM | POA: Insufficient documentation

## 2020-04-18 DIAGNOSIS — R6883 Chills (without fever): Secondary | ICD-10-CM | POA: Insufficient documentation

## 2020-04-18 DIAGNOSIS — Z809 Family history of malignant neoplasm, unspecified: Secondary | ICD-10-CM | POA: Insufficient documentation

## 2020-04-18 DIAGNOSIS — D631 Anemia in chronic kidney disease: Secondary | ICD-10-CM | POA: Insufficient documentation

## 2020-04-18 DIAGNOSIS — Z8249 Family history of ischemic heart disease and other diseases of the circulatory system: Secondary | ICD-10-CM | POA: Insufficient documentation

## 2020-04-18 DIAGNOSIS — Z79899 Other long term (current) drug therapy: Secondary | ICD-10-CM | POA: Diagnosis not present

## 2020-04-18 DIAGNOSIS — Z9049 Acquired absence of other specified parts of digestive tract: Secondary | ICD-10-CM | POA: Insufficient documentation

## 2020-04-18 DIAGNOSIS — R5383 Other fatigue: Secondary | ICD-10-CM | POA: Insufficient documentation

## 2020-04-18 DIAGNOSIS — M069 Rheumatoid arthritis, unspecified: Secondary | ICD-10-CM | POA: Insufficient documentation

## 2020-04-18 DIAGNOSIS — G479 Sleep disorder, unspecified: Secondary | ICD-10-CM | POA: Insufficient documentation

## 2020-04-18 LAB — COMPREHENSIVE METABOLIC PANEL
ALT: 26 U/L (ref 0–44)
AST: 19 U/L (ref 15–41)
Albumin: 4.4 g/dL (ref 3.5–5.0)
Alkaline Phosphatase: 51 U/L (ref 38–126)
Anion gap: 9 (ref 5–15)
BUN: 28 mg/dL — ABNORMAL HIGH (ref 8–23)
CO2: 25 mmol/L (ref 22–32)
Calcium: 9.7 mg/dL (ref 8.9–10.3)
Chloride: 105 mmol/L (ref 98–111)
Creatinine, Ser: 1.67 mg/dL — ABNORMAL HIGH (ref 0.61–1.24)
GFR, Estimated: 45 mL/min — ABNORMAL LOW (ref >60)
Glucose, Bld: 133 mg/dL — ABNORMAL HIGH (ref 70–99)
Potassium: 5 mmol/L (ref 3.5–5.1)
Sodium: 139 mmol/L (ref 135–145)
Total Bilirubin: 0.6 mg/dL (ref 0.3–1.2)
Total Protein: 7.7 g/dL (ref 6.5–8.1)

## 2020-04-18 LAB — CBC WITH DIFFERENTIAL/PLATELET
Abs Immature Granulocytes: 0.15 10*3/uL — ABNORMAL HIGH (ref 0.00–0.07)
Basophils Absolute: 0.1 10*3/uL (ref 0.0–0.1)
Basophils Relative: 1 %
Eosinophils Absolute: 0.3 10*3/uL (ref 0.0–0.5)
Eosinophils Relative: 2 %
HCT: 38.3 % — ABNORMAL LOW (ref 39.0–52.0)
Hemoglobin: 12 g/dL — ABNORMAL LOW (ref 13.0–17.0)
Immature Granulocytes: 1 %
Lymphocytes Relative: 23 %
Lymphs Abs: 4 10*3/uL (ref 0.7–4.0)
MCH: 26.9 pg (ref 26.0–34.0)
MCHC: 31.3 g/dL (ref 30.0–36.0)
MCV: 85.9 fL (ref 80.0–100.0)
Monocytes Absolute: 0.7 10*3/uL (ref 0.1–1.0)
Monocytes Relative: 4 %
Neutro Abs: 12 10*3/uL — ABNORMAL HIGH (ref 1.7–7.7)
Neutrophils Relative %: 69 %
Platelets: 381 10*3/uL (ref 150–400)
RBC: 4.46 MIL/uL (ref 4.22–5.81)
RDW: 14.7 % (ref 11.5–15.5)
WBC: 17.3 10*3/uL — ABNORMAL HIGH (ref 4.0–10.5)
nRBC: 0 % (ref 0.0–0.2)

## 2020-04-18 LAB — LACTATE DEHYDROGENASE: LDH: 118 U/L (ref 98–192)

## 2020-04-18 LAB — IRON AND TIBC
Iron: 60 ug/dL (ref 45–182)
Saturation Ratios: 18 % (ref 17.9–39.5)
TIBC: 336 ug/dL (ref 250–450)
UIBC: 276 ug/dL

## 2020-04-18 LAB — FERRITIN: Ferritin: 545 ng/mL — ABNORMAL HIGH (ref 24–336)

## 2020-04-18 MED ORDER — INFLUENZA VAC A&B SA ADJ QUAD 0.5 ML IM PRSY
0.5000 mL | PREFILLED_SYRINGE | Freq: Once | INTRAMUSCULAR | Status: AC
  Administered 2020-04-18: 0.5 mL via INTRAMUSCULAR
  Filled 2020-04-18: qty 0.5

## 2020-04-18 NOTE — Progress Notes (Signed)
[place], [place] [address]   CLINIC:  Medical Oncology/Hematology  PCP:  [name], [address] / [place] [address]  [phone]  REASON FOR VISIT:  Follow-up for normocytic anemia and leukocytosis  PRIOR THERAPY: None  CURRENT THERAPY: Intermittent Feraheme last on 07/23/2019  INTERVAL HISTORY:  Mr. Johnson [place]ola, a 69 y.o. male, returns for routine follow-up for his normocytic anemia and leukocytosis. Ahmarion was last seen on 07/20/2019.  Today he reports feeling well. He denies having any nosebleeds, hematochezia or hematuria. He drinks about 3-4 bottles of water daily. He denies any changes in his medications, though he did have a steroid injection into his left knee and he was taking prednisone for 7 days which he finished 1 week ago. He was also placed on Bactrim for a possible left knee infection. He reports having intermittent chills, but denies fevers or leg swelling.   REVIEW OF SYSTEMS:  Review of Systems  Constitutional: Positive for chills (intermittent) and fatigue (50%). Negative for appetite change and fever.  Cardiovascular: Negative for leg swelling.  Genitourinary: Positive for frequency.   Musculoskeletal: Positive for arthralgias (5/10 L knee pain).  Psychiatric/Behavioral: Positive for sleep disturbance.  All other systems reviewed and are negative.   PAST MEDICAL/SURGICAL HISTORY:  Past Medical History:  Diagnosis Date  . Diverticulosis   . Hypertension   . RA (rheumatoid arthritis) ([place])   . SBO (small bowel obstruction) s/p ex lap in past 12/15/2015   Past Surgical History:  Procedure Laterality Date  . APPENDECTOMY    . COLOSTOMY TAKEDOWN     [place] N/A 12/15/2015   Procedure: FLEXIBLE SIGMOIDOSCOPY;  Surgeon: Danie Binder, MD;  [facility];  Service: Endoscopy;  Laterality: N/A;  . KNEE SURGERY    . LEFT COLECTOMY     [place]  .  LYSIS OF ADHESION     SBO    SOCIAL HISTORY:  Social History   Socioeconomic History  . Marital status: Single    Spouse name: Not on file  . Number of children: 3  . Years of education: Not on file  . Highest education level: Not on file  Occupational History  . Occupation: retired  Tobacco Use  . Smoking status: Never Smoker  . Smokeless tobacco: Never Used  Substance and Sexual Activity  . Alcohol use: Yes    Comment: occ.   . Drug use: No  . Sexual activity: Not on file  Other Topics Concern  . Not on file  Social History Narrative  . Not on file   Social Determinants of Health   Financial Resource Strain: High Risk  . Difficulty of Paying Living Expenses: Very hard  Food Insecurity: Food Insecurity Present  . Worried About Charity fundraiser in the Last Year: Sometimes true  . Ran Out of Food in the Last Year: Sometimes true  Transportation Needs: No Transportation Needs  . Lack of Transportation (Medical): No  . Lack of Transportation (Non-Medical): No  Physical Activity: Insufficiently Active  . Days of Exercise per Week: 2 days  . Minutes of Exercise per Session: 20 min  Stress: Stress Concern Present  . Feeling of Stress : Rather much  Social Connections: Moderately Isolated  . Frequency of Communication with Friends and Family: More than three times a week  . Frequency of Social Gatherings with Friends and Family: Once a week  . Attends Religious Services:  More than 4 times per year  . Active Member of Clubs or Organizations: No  . Attends Archivist Meetings: Never  . Marital Status: Divorced  Human resources officer Violence: Not At Risk  . Fear of Current or Ex-Partner: No  . Emotionally Abused: No  . Physically Abused: No  . Sexually Abused: No    FAMILY HISTORY:  Family History  Problem Relation Age of Onset  . Heart attack Father   . Cancer Mother   . Hypertension Brother   . Hypertension Sister     CURRENT MEDICATIONS:  Current  Outpatient Medications  Medication Sig Dispense Refill  . adapalene (DIFFERIN) 0.1 % cream Apply 1 application topically daily as needed.  6  . albuterol (PROVENTIL HFA;VENTOLIN HFA) 108 (90 Base) MCG/ACT inhaler Inhale 2 puffs into the lungs every 6 (six) hours as needed for wheezing or shortness of breath.    Marland Kitchen amLODipine (NORVASC) 10 MG tablet Take 10 mg by mouth daily.    . Ascorbic Acid (VITAMIN C) 1000 MG tablet Take 1,000 mg by mouth daily.    . carvedilol (COREG) 3.125 MG tablet Take 3.125 mg by mouth 2 (two) times daily with a meal.    . Cinnamon 500 MG capsule Take 500 mg by mouth daily.    . clonazePAM (KLONOPIN) 1 MG tablet Take 1 mg by mouth as needed for anxiety.    . Coenzyme Q10 (CO Q10) 100 MG CAPS Take 100 mg by mouth 2 (two) times daily.    . diclofenac Sodium (VOLTAREN) 1 % GEL Apply 2 g topically 4 (four) times daily. Rub into affected area of foot 2 to 4 times daily 100 g 2  . glipiZIDE (GLUCOTROL XL) 5 MG 24 hr tablet Take 5 mg by mouth daily with breakfast.    . HYDROcodone-acetaminophen (NORCO/VICODIN) 5-325 MG tablet hydrocodone 5 mg-acetaminophen 325 mg tablet  TAKE 1 TABLET BY MOUTH EVERY 6 HOURS FOR 5 DAYS    . hydrOXYzine (ATARAX/VISTARIL) 25 MG tablet Take 25 mg by mouth 2 (two) times daily.    Marland Kitchen ketoconazole (NIZORAL) 2 % cream Apply 1 application topically daily as needed for irritation.   2  . lovastatin (MEVACOR) 20 MG tablet     . magnesium gluconate (MAGONATE) 500 MG tablet Take 500 mg by mouth daily. Take 250 mg daily    . methocarbamol (ROBAXIN) 500 MG tablet Take 500 mg by mouth 4 (four) times daily. Pt is only taking prn    . Multiple Vitamin (MULTIVITAMIN WITH MINERALS) TABS tablet Take 1 tablet by mouth daily.    . Omega-3 Fatty Acids (FISH OIL) 1200 MG CAPS Take by mouth every other day.    . sulfamethoxazole-trimethoprim (BACTRIM DS) 800-160 MG tablet Take 1 tablet by mouth daily.     Marland Kitchen telmisartan (MICARDIS) 20 MG tablet Take 20 mg by mouth daily.      No current facility-administered medications for this visit.    ALLERGIES:  Allergies  Allergen Reactions  . Latex Shortness Of Breath    PHYSICAL EXAM:  Performance status (ECOG): 1 - Symptomatic but completely ambulatory  Vitals:   04/18/20 1620  BP: (!) 153/72  Pulse: 70  Resp: 16  Temp: 98.6 F (37 C)  SpO2: 100%   Wt Readings from Last 3 Encounters:  04/18/20 171 lb 4.8 oz (77.7 kg)  07/20/19 169 lb (76.7 kg)  06/02/19 170 lb 14.4 oz (77.5 kg)   Physical Exam Vitals reviewed.  Constitutional:  Appearance: Normal appearance.  Cardiovascular:     Rate and Rhythm: Normal rate and regular rhythm.     Pulses: Normal pulses.     Heart sounds: Normal heart sounds.  Pulmonary:     Effort: Pulmonary effort is normal.     Breath sounds: Normal breath sounds.  Musculoskeletal:     Right lower leg: No edema.     Left lower leg: No edema.  Neurological:     General: No focal deficit present.     Mental Status: He is alert and oriented to person, place, and time.  Psychiatric:        Mood and Affect: Mood normal.        Behavior: Behavior normal.     LABORATORY DATA:  I have reviewed the labs as listed.  CBC Latest Ref Rng & Units 04/18/2020 12/09/2019 09/15/2019  WBC 4.0 - 10.5 K/uL 17.3(H) 22.1(H) 11.8(H)  Hemoglobin 13.0 - 17.0 g/dL 12.0(L) 10.9(L) 10.7(L)  Hematocrit 39 - 52 % 38.3(L) 34.1(L) 33.8(L)  Platelets 150 - 400 K/uL 381 403(H) 331   CMP Latest Ref Rng & Units 04/18/2020 12/09/2019 06/02/2019  Glucose 70 - 99 mg/dL 133(H) 70 131(H)  BUN 8 - 23 mg/dL 28(H) 24(H) 22  Creatinine 0.61 - 1.24 mg/dL 1.67(H) 1.17 1.45(H)  Sodium 135 - 145 mmol/L 139 139 137  Potassium 3.5 - 5.1 mmol/L 5.0 4.0 4.4  Chloride 98 - 111 mmol/L 105 106 104  CO2 22 - 32 mmol/L 25 23 25   Calcium 8.9 - 10.3 mg/dL 9.7 9.1 9.3  Total Protein 6.5 - 8.1 g/dL 7.7 7.3 7.6  Total Bilirubin 0.3 - 1.2 mg/dL 0.6 0.4 0.5  Alkaline Phos 38 - 126 U/L 51 54 55  AST 15 - 41 U/L 19 19 16     ALT 0 - 44 U/L 26 22 14       Component Value Date/Time   RBC 4.46 04/18/2020 1331   MCV 85.9 04/18/2020 1331   MCH 26.9 04/18/2020 1331   MCHC 31.3 04/18/2020 1331   RDW 14.7 04/18/2020 1331   LYMPHSABS 4.0 04/18/2020 1331   MONOABS 0.7 04/18/2020 1331   EOSABS 0.3 04/18/2020 1331   BASOSABS 0.1 04/18/2020 1331    DIAGNOSTIC IMAGING:  I have independently reviewed the scans and discussed with the patient. No results found.   ASSESSMENT:  1.  Normocytic anemia: -Combination anemia from CKD and relative iron deficiency. -Stool for occult blood was negative.  Denies any bleeding per rectum or melena. -Last Feraheme was on 07/23/2019.  2.  Leukocytosis: -CBC on 07/14/2019 shows white count 12.2.  Absolute neutrophil count is 9.2.  Platelet count is normal. -Flow cytometry on 05/12/2019 did not show any monoclonal B-cell population.  Predominance of lymphocytes and nonspecific changes. -JAK2 V617F and reflex testing was negative.  BCR/ABL by FISH was negative.  3.  Rheumatoid arthritis: -Diagnosed in 2013.  He had been on multiple immunosuppressive medications.  Currently on Orencia.   PLAN:  1.  Normocytic anemia: -Hemoglobin improved to 12.  Ferritin is 545 and percent saturation is 18. -We will continue monitoring in 4 months with repeat labs.  2.  Leukocytosis: -He was tested negative for myeloproliferative disorders.  White count today is 17.3 and stable.  No B symptoms. -No further work-up is needed at this time.  3.  CKD: -Creatinine has increased to 1.67 from 1.17 previously. -Encouraged hydration 2 to 3 L/day.   Orders placed this encounter:  No orders of the defined types  were placed in this encounter.    Derek Jack, MD [place] [phone]   I, Milinda Antis, am acting as a scribe for Dr. Sanda Linger.  I, Derek Jack MD, have reviewed the above documentation for accuracy and completeness, and I agree with  the above.

## 2020-04-18 NOTE — Patient Instructions (Signed)
[place] at [facility] Discharge Instructions  You were seen today by Dr. Delton Coombes. He went over your recent results. Drink at least 60-70 ounces of water daily to improve your kidney function. Dr. Delton Coombes will see you back in 4 months for labs and follow up.   Thank you for choosing [place] at [facility] to provide your oncology and hematology care.  To afford each patient quality time with our provider, please arrive at least 15 minutes before your scheduled appointment time.   If you have a lab appointment with the [facility] please come in thru the Main Entrance and check in at the main information desk  You need to re-schedule your appointment should you arrive 10 or more minutes late.  We strive to give you quality time with our providers, and arriving late affects you and other patients whose appointments are after yours.  Also, if you no show three or more times for appointments you may be dismissed from the clinic at the providers discretion.     Again, thank you for choosing [facility].  Our hope is that these requests will decrease the amount of time that you wait before being seen by our physicians.       _____________________________________________________________  Should you have questions after your visit to [facility], please contact our office at [phone] between the hours of 8:00 a.m. and 4:30 p.m.  Voicemails left after 4:00 p.m. will not be returned until the following business day.  For prescription refill requests, have your pharmacy contact our office and allow 72 hours.    Cancer Center Support Programs:   > Cancer Support Group  2nd Tuesday of the month 1pm-2pm, Journey Room

## 2020-04-18 NOTE — Progress Notes (Signed)
Kevin Lopez presents today for injection per the provider's orders.  Flu administration without incident; injection site WNL; see MAR for injection details.  Patient tolerated procedure well and without incident.  No questions or complaints noted at this time.

## 2020-04-25 DIAGNOSIS — N289 Disorder of kidney and ureter, unspecified: Secondary | ICD-10-CM | POA: Diagnosis not present

## 2020-04-25 DIAGNOSIS — Z79899 Other long term (current) drug therapy: Secondary | ICD-10-CM | POA: Diagnosis not present

## 2020-04-25 DIAGNOSIS — M549 Dorsalgia, unspecified: Secondary | ICD-10-CM | POA: Diagnosis not present

## 2020-04-25 DIAGNOSIS — M25562 Pain in left knee: Secondary | ICD-10-CM | POA: Diagnosis not present

## 2020-04-25 DIAGNOSIS — M009 Pyogenic arthritis, unspecified: Secondary | ICD-10-CM | POA: Diagnosis not present

## 2020-04-25 DIAGNOSIS — M109 Gout, unspecified: Secondary | ICD-10-CM | POA: Diagnosis not present

## 2020-04-25 DIAGNOSIS — M0589 Other rheumatoid arthritis with rheumatoid factor of multiple sites: Secondary | ICD-10-CM | POA: Diagnosis not present

## 2020-04-26 DIAGNOSIS — I129 Hypertensive chronic kidney disease with stage 1 through stage 4 chronic kidney disease, or unspecified chronic kidney disease: Secondary | ICD-10-CM | POA: Diagnosis not present

## 2020-04-26 DIAGNOSIS — E1122 Type 2 diabetes mellitus with diabetic chronic kidney disease: Secondary | ICD-10-CM | POA: Diagnosis not present

## 2020-04-26 DIAGNOSIS — Z0000 Encounter for general adult medical examination without abnormal findings: Secondary | ICD-10-CM | POA: Diagnosis not present

## 2020-04-26 DIAGNOSIS — D72829 Elevated white blood cell count, unspecified: Secondary | ICD-10-CM | POA: Diagnosis not present

## 2020-04-26 DIAGNOSIS — Z79899 Other long term (current) drug therapy: Secondary | ICD-10-CM | POA: Diagnosis not present

## 2020-04-26 DIAGNOSIS — E782 Mixed hyperlipidemia: Secondary | ICD-10-CM | POA: Diagnosis not present

## 2020-04-26 DIAGNOSIS — M109 Gout, unspecified: Secondary | ICD-10-CM | POA: Diagnosis not present

## 2020-04-26 DIAGNOSIS — M0589 Other rheumatoid arthritis with rheumatoid factor of multiple sites: Secondary | ICD-10-CM | POA: Diagnosis not present

## 2020-04-26 DIAGNOSIS — Z125 Encounter for screening for malignant neoplasm of prostate: Secondary | ICD-10-CM | POA: Diagnosis not present

## 2020-04-26 DIAGNOSIS — D649 Anemia, unspecified: Secondary | ICD-10-CM | POA: Diagnosis not present

## 2020-04-26 DIAGNOSIS — I10 Essential (primary) hypertension: Secondary | ICD-10-CM | POA: Diagnosis not present

## 2020-04-26 DIAGNOSIS — E1121 Type 2 diabetes mellitus with diabetic nephropathy: Secondary | ICD-10-CM | POA: Diagnosis not present

## 2020-04-26 DIAGNOSIS — R5383 Other fatigue: Secondary | ICD-10-CM | POA: Diagnosis not present

## 2020-04-26 DIAGNOSIS — E1165 Type 2 diabetes mellitus with hyperglycemia: Secondary | ICD-10-CM | POA: Diagnosis not present

## 2020-05-02 DIAGNOSIS — E1121 Type 2 diabetes mellitus with diabetic nephropathy: Secondary | ICD-10-CM | POA: Diagnosis not present

## 2020-05-02 DIAGNOSIS — M0589 Other rheumatoid arthritis with rheumatoid factor of multiple sites: Secondary | ICD-10-CM | POA: Diagnosis not present

## 2020-05-02 DIAGNOSIS — I129 Hypertensive chronic kidney disease with stage 1 through stage 4 chronic kidney disease, or unspecified chronic kidney disease: Secondary | ICD-10-CM | POA: Diagnosis not present

## 2020-05-02 DIAGNOSIS — M150 Primary generalized (osteo)arthritis: Secondary | ICD-10-CM | POA: Diagnosis not present

## 2020-05-02 DIAGNOSIS — E782 Mixed hyperlipidemia: Secondary | ICD-10-CM | POA: Diagnosis not present

## 2020-05-02 DIAGNOSIS — E1122 Type 2 diabetes mellitus with diabetic chronic kidney disease: Secondary | ICD-10-CM | POA: Diagnosis not present

## 2020-05-02 DIAGNOSIS — E1165 Type 2 diabetes mellitus with hyperglycemia: Secondary | ICD-10-CM | POA: Diagnosis not present

## 2020-05-02 DIAGNOSIS — N401 Enlarged prostate with lower urinary tract symptoms: Secondary | ICD-10-CM | POA: Diagnosis not present

## 2020-05-02 DIAGNOSIS — N182 Chronic kidney disease, stage 2 (mild): Secondary | ICD-10-CM | POA: Diagnosis not present

## 2020-05-04 DIAGNOSIS — Z79899 Other long term (current) drug therapy: Secondary | ICD-10-CM | POA: Diagnosis not present

## 2020-06-08 DIAGNOSIS — M0589 Other rheumatoid arthritis with rheumatoid factor of multiple sites: Secondary | ICD-10-CM | POA: Diagnosis not present

## 2020-06-14 DIAGNOSIS — H40013 Open angle with borderline findings, low risk, bilateral: Secondary | ICD-10-CM | POA: Diagnosis not present

## 2020-06-20 DIAGNOSIS — Z20822 Contact with and (suspected) exposure to covid-19: Secondary | ICD-10-CM | POA: Diagnosis not present

## 2020-06-29 DIAGNOSIS — L732 Hidradenitis suppurativa: Secondary | ICD-10-CM | POA: Diagnosis not present

## 2020-06-29 DIAGNOSIS — L738 Other specified follicular disorders: Secondary | ICD-10-CM | POA: Diagnosis not present

## 2020-06-29 DIAGNOSIS — L218 Other seborrheic dermatitis: Secondary | ICD-10-CM | POA: Diagnosis not present

## 2020-06-29 DIAGNOSIS — R21 Rash and other nonspecific skin eruption: Secondary | ICD-10-CM | POA: Diagnosis not present

## 2020-07-12 DIAGNOSIS — S5002XA Contusion of left elbow, initial encounter: Secondary | ICD-10-CM | POA: Diagnosis not present

## 2020-07-12 DIAGNOSIS — Z6825 Body mass index (BMI) 25.0-25.9, adult: Secondary | ICD-10-CM | POA: Diagnosis not present

## 2020-07-12 DIAGNOSIS — M069 Rheumatoid arthritis, unspecified: Secondary | ICD-10-CM | POA: Diagnosis not present

## 2020-07-12 DIAGNOSIS — M25422 Effusion, left elbow: Secondary | ICD-10-CM | POA: Diagnosis not present

## 2020-07-17 DIAGNOSIS — M25522 Pain in left elbow: Secondary | ICD-10-CM | POA: Diagnosis not present

## 2020-07-17 DIAGNOSIS — M0589 Other rheumatoid arthritis with rheumatoid factor of multiple sites: Secondary | ICD-10-CM | POA: Diagnosis not present

## 2020-07-17 DIAGNOSIS — M79642 Pain in left hand: Secondary | ICD-10-CM | POA: Diagnosis not present

## 2020-07-17 DIAGNOSIS — M7989 Other specified soft tissue disorders: Secondary | ICD-10-CM | POA: Diagnosis not present

## 2020-07-17 DIAGNOSIS — M25422 Effusion, left elbow: Secondary | ICD-10-CM | POA: Diagnosis not present

## 2020-07-18 DIAGNOSIS — M25439 Effusion, unspecified wrist: Secondary | ICD-10-CM | POA: Diagnosis not present

## 2020-07-18 DIAGNOSIS — M109 Gout, unspecified: Secondary | ICD-10-CM | POA: Diagnosis not present

## 2020-07-18 DIAGNOSIS — Z79899 Other long term (current) drug therapy: Secondary | ICD-10-CM | POA: Diagnosis not present

## 2020-07-18 DIAGNOSIS — M009 Pyogenic arthritis, unspecified: Secondary | ICD-10-CM | POA: Diagnosis not present

## 2020-07-18 DIAGNOSIS — M0589 Other rheumatoid arthritis with rheumatoid factor of multiple sites: Secondary | ICD-10-CM | POA: Diagnosis not present

## 2020-07-19 DIAGNOSIS — L03114 Cellulitis of left upper limb: Secondary | ICD-10-CM | POA: Diagnosis not present

## 2020-07-19 DIAGNOSIS — M79602 Pain in left arm: Secondary | ICD-10-CM | POA: Diagnosis not present

## 2020-07-21 DIAGNOSIS — L03114 Cellulitis of left upper limb: Secondary | ICD-10-CM | POA: Diagnosis not present

## 2020-07-27 DIAGNOSIS — M25522 Pain in left elbow: Secondary | ICD-10-CM | POA: Diagnosis not present

## 2020-07-27 DIAGNOSIS — M25532 Pain in left wrist: Secondary | ICD-10-CM | POA: Diagnosis not present

## 2020-08-03 DIAGNOSIS — N289 Disorder of kidney and ureter, unspecified: Secondary | ICD-10-CM | POA: Diagnosis not present

## 2020-08-03 DIAGNOSIS — M0589 Other rheumatoid arthritis with rheumatoid factor of multiple sites: Secondary | ICD-10-CM | POA: Diagnosis not present

## 2020-08-03 DIAGNOSIS — M109 Gout, unspecified: Secondary | ICD-10-CM | POA: Diagnosis not present

## 2020-08-03 DIAGNOSIS — M25439 Effusion, unspecified wrist: Secondary | ICD-10-CM | POA: Diagnosis not present

## 2020-08-03 DIAGNOSIS — Z79899 Other long term (current) drug therapy: Secondary | ICD-10-CM | POA: Diagnosis not present

## 2020-08-03 DIAGNOSIS — M009 Pyogenic arthritis, unspecified: Secondary | ICD-10-CM | POA: Diagnosis not present

## 2020-08-04 DIAGNOSIS — R350 Frequency of micturition: Secondary | ICD-10-CM | POA: Diagnosis not present

## 2020-08-04 DIAGNOSIS — R351 Nocturia: Secondary | ICD-10-CM | POA: Diagnosis not present

## 2020-08-04 DIAGNOSIS — R339 Retention of urine, unspecified: Secondary | ICD-10-CM | POA: Diagnosis not present

## 2020-08-08 DIAGNOSIS — M0589 Other rheumatoid arthritis with rheumatoid factor of multiple sites: Secondary | ICD-10-CM | POA: Diagnosis not present

## 2020-08-10 DIAGNOSIS — L732 Hidradenitis suppurativa: Secondary | ICD-10-CM | POA: Diagnosis not present

## 2020-08-10 DIAGNOSIS — L218 Other seborrheic dermatitis: Secondary | ICD-10-CM | POA: Diagnosis not present

## 2020-08-10 DIAGNOSIS — L738 Other specified follicular disorders: Secondary | ICD-10-CM | POA: Diagnosis not present

## 2020-08-16 ENCOUNTER — Inpatient Hospital Stay (HOSPITAL_COMMUNITY): Payer: Medicare Other

## 2020-08-23 ENCOUNTER — Ambulatory Visit (HOSPITAL_COMMUNITY): Payer: Medicare Other | Admitting: Hematology

## 2020-09-11 DIAGNOSIS — N182 Chronic kidney disease, stage 2 (mild): Secondary | ICD-10-CM | POA: Diagnosis not present

## 2020-09-11 DIAGNOSIS — E1165 Type 2 diabetes mellitus with hyperglycemia: Secondary | ICD-10-CM | POA: Diagnosis not present

## 2020-09-11 DIAGNOSIS — M150 Primary generalized (osteo)arthritis: Secondary | ICD-10-CM | POA: Diagnosis not present

## 2020-09-11 DIAGNOSIS — E1122 Type 2 diabetes mellitus with diabetic chronic kidney disease: Secondary | ICD-10-CM | POA: Diagnosis not present

## 2020-09-11 DIAGNOSIS — I129 Hypertensive chronic kidney disease with stage 1 through stage 4 chronic kidney disease, or unspecified chronic kidney disease: Secondary | ICD-10-CM | POA: Diagnosis not present

## 2020-09-11 DIAGNOSIS — E782 Mixed hyperlipidemia: Secondary | ICD-10-CM | POA: Diagnosis not present

## 2020-09-11 DIAGNOSIS — M0589 Other rheumatoid arthritis with rheumatoid factor of multiple sites: Secondary | ICD-10-CM | POA: Diagnosis not present

## 2020-09-12 DIAGNOSIS — Z79899 Other long term (current) drug therapy: Secondary | ICD-10-CM | POA: Diagnosis not present

## 2020-09-12 DIAGNOSIS — M009 Pyogenic arthritis, unspecified: Secondary | ICD-10-CM | POA: Diagnosis not present

## 2020-09-12 DIAGNOSIS — M0589 Other rheumatoid arthritis with rheumatoid factor of multiple sites: Secondary | ICD-10-CM | POA: Diagnosis not present

## 2020-09-12 DIAGNOSIS — M109 Gout, unspecified: Secondary | ICD-10-CM | POA: Diagnosis not present

## 2020-09-12 DIAGNOSIS — M25439 Effusion, unspecified wrist: Secondary | ICD-10-CM | POA: Diagnosis not present

## 2020-09-12 DIAGNOSIS — N289 Disorder of kidney and ureter, unspecified: Secondary | ICD-10-CM | POA: Diagnosis not present

## 2020-09-14 DIAGNOSIS — E1122 Type 2 diabetes mellitus with diabetic chronic kidney disease: Secondary | ICD-10-CM | POA: Diagnosis not present

## 2020-09-14 DIAGNOSIS — I129 Hypertensive chronic kidney disease with stage 1 through stage 4 chronic kidney disease, or unspecified chronic kidney disease: Secondary | ICD-10-CM | POA: Diagnosis not present

## 2020-09-14 DIAGNOSIS — E782 Mixed hyperlipidemia: Secondary | ICD-10-CM | POA: Diagnosis not present

## 2020-09-19 DIAGNOSIS — R7309 Other abnormal glucose: Secondary | ICD-10-CM | POA: Diagnosis not present

## 2020-09-19 DIAGNOSIS — Z125 Encounter for screening for malignant neoplasm of prostate: Secondary | ICD-10-CM | POA: Diagnosis not present

## 2020-09-19 DIAGNOSIS — R3915 Urgency of urination: Secondary | ICD-10-CM | POA: Diagnosis not present

## 2020-09-19 DIAGNOSIS — N401 Enlarged prostate with lower urinary tract symptoms: Secondary | ICD-10-CM | POA: Diagnosis not present

## 2020-09-19 DIAGNOSIS — N529 Male erectile dysfunction, unspecified: Secondary | ICD-10-CM | POA: Diagnosis not present

## 2020-09-19 DIAGNOSIS — L732 Hidradenitis suppurativa: Secondary | ICD-10-CM | POA: Diagnosis not present

## 2020-09-19 DIAGNOSIS — E291 Testicular hypofunction: Secondary | ICD-10-CM | POA: Diagnosis not present

## 2020-09-19 DIAGNOSIS — R972 Elevated prostate specific antigen [PSA]: Secondary | ICD-10-CM | POA: Diagnosis not present

## 2020-09-19 DIAGNOSIS — N35919 Unspecified urethral stricture, male, unspecified site: Secondary | ICD-10-CM | POA: Diagnosis not present

## 2020-09-19 DIAGNOSIS — R350 Frequency of micturition: Secondary | ICD-10-CM | POA: Diagnosis not present

## 2020-09-19 DIAGNOSIS — R351 Nocturia: Secondary | ICD-10-CM | POA: Diagnosis not present

## 2020-09-19 DIAGNOSIS — N500 Atrophy of testis: Secondary | ICD-10-CM | POA: Diagnosis not present

## 2020-10-23 DIAGNOSIS — Z23 Encounter for immunization: Secondary | ICD-10-CM | POA: Diagnosis not present

## 2020-10-26 DIAGNOSIS — M0589 Other rheumatoid arthritis with rheumatoid factor of multiple sites: Secondary | ICD-10-CM | POA: Diagnosis not present

## 2020-11-10 DIAGNOSIS — B360 Pityriasis versicolor: Secondary | ICD-10-CM | POA: Diagnosis not present

## 2020-11-10 DIAGNOSIS — L732 Hidradenitis suppurativa: Secondary | ICD-10-CM | POA: Diagnosis not present

## 2020-11-10 DIAGNOSIS — R21 Rash and other nonspecific skin eruption: Secondary | ICD-10-CM | POA: Diagnosis not present

## 2020-11-22 DIAGNOSIS — M0589 Other rheumatoid arthritis with rheumatoid factor of multiple sites: Secondary | ICD-10-CM | POA: Diagnosis not present

## 2020-11-23 DIAGNOSIS — M0589 Other rheumatoid arthritis with rheumatoid factor of multiple sites: Secondary | ICD-10-CM | POA: Diagnosis not present

## 2020-11-27 DIAGNOSIS — M542 Cervicalgia: Secondary | ICD-10-CM | POA: Diagnosis not present

## 2020-11-27 DIAGNOSIS — M25439 Effusion, unspecified wrist: Secondary | ICD-10-CM | POA: Diagnosis not present

## 2020-11-27 DIAGNOSIS — M0589 Other rheumatoid arthritis with rheumatoid factor of multiple sites: Secondary | ICD-10-CM | POA: Diagnosis not present

## 2020-11-27 DIAGNOSIS — Z79899 Other long term (current) drug therapy: Secondary | ICD-10-CM | POA: Diagnosis not present

## 2020-11-27 DIAGNOSIS — M109 Gout, unspecified: Secondary | ICD-10-CM | POA: Diagnosis not present

## 2020-11-27 DIAGNOSIS — N289 Disorder of kidney and ureter, unspecified: Secondary | ICD-10-CM | POA: Diagnosis not present

## 2020-11-27 DIAGNOSIS — M009 Pyogenic arthritis, unspecified: Secondary | ICD-10-CM | POA: Diagnosis not present

## 2020-12-05 DIAGNOSIS — R293 Abnormal posture: Secondary | ICD-10-CM | POA: Diagnosis not present

## 2020-12-05 DIAGNOSIS — M546 Pain in thoracic spine: Secondary | ICD-10-CM | POA: Diagnosis not present

## 2020-12-05 DIAGNOSIS — R531 Weakness: Secondary | ICD-10-CM | POA: Diagnosis not present

## 2020-12-05 DIAGNOSIS — M542 Cervicalgia: Secondary | ICD-10-CM | POA: Diagnosis not present

## 2020-12-07 DIAGNOSIS — M546 Pain in thoracic spine: Secondary | ICD-10-CM | POA: Diagnosis not present

## 2020-12-07 DIAGNOSIS — R293 Abnormal posture: Secondary | ICD-10-CM | POA: Diagnosis not present

## 2020-12-07 DIAGNOSIS — M542 Cervicalgia: Secondary | ICD-10-CM | POA: Diagnosis not present

## 2020-12-07 DIAGNOSIS — R531 Weakness: Secondary | ICD-10-CM | POA: Diagnosis not present

## 2020-12-12 DIAGNOSIS — R293 Abnormal posture: Secondary | ICD-10-CM | POA: Diagnosis not present

## 2020-12-12 DIAGNOSIS — R531 Weakness: Secondary | ICD-10-CM | POA: Diagnosis not present

## 2020-12-12 DIAGNOSIS — M542 Cervicalgia: Secondary | ICD-10-CM | POA: Diagnosis not present

## 2020-12-12 DIAGNOSIS — M546 Pain in thoracic spine: Secondary | ICD-10-CM | POA: Diagnosis not present

## 2020-12-14 DIAGNOSIS — M546 Pain in thoracic spine: Secondary | ICD-10-CM | POA: Diagnosis not present

## 2020-12-14 DIAGNOSIS — R293 Abnormal posture: Secondary | ICD-10-CM | POA: Diagnosis not present

## 2020-12-14 DIAGNOSIS — M542 Cervicalgia: Secondary | ICD-10-CM | POA: Diagnosis not present

## 2020-12-14 DIAGNOSIS — R531 Weakness: Secondary | ICD-10-CM | POA: Diagnosis not present

## 2020-12-17 DIAGNOSIS — I129 Hypertensive chronic kidney disease with stage 1 through stage 4 chronic kidney disease, or unspecified chronic kidney disease: Secondary | ICD-10-CM | POA: Diagnosis not present

## 2020-12-17 DIAGNOSIS — E1122 Type 2 diabetes mellitus with diabetic chronic kidney disease: Secondary | ICD-10-CM | POA: Diagnosis not present

## 2020-12-17 DIAGNOSIS — E782 Mixed hyperlipidemia: Secondary | ICD-10-CM | POA: Diagnosis not present

## 2020-12-17 DIAGNOSIS — N182 Chronic kidney disease, stage 2 (mild): Secondary | ICD-10-CM | POA: Diagnosis not present

## 2020-12-18 ENCOUNTER — Other Ambulatory Visit: Payer: Self-pay | Admitting: Nephrology

## 2020-12-18 DIAGNOSIS — N2581 Secondary hyperparathyroidism of renal origin: Secondary | ICD-10-CM | POA: Diagnosis not present

## 2020-12-18 DIAGNOSIS — N189 Chronic kidney disease, unspecified: Secondary | ICD-10-CM

## 2020-12-18 DIAGNOSIS — N183 Chronic kidney disease, stage 3 unspecified: Secondary | ICD-10-CM

## 2020-12-18 DIAGNOSIS — D631 Anemia in chronic kidney disease: Secondary | ICD-10-CM

## 2020-12-18 DIAGNOSIS — I129 Hypertensive chronic kidney disease with stage 1 through stage 4 chronic kidney disease, or unspecified chronic kidney disease: Secondary | ICD-10-CM

## 2020-12-20 DIAGNOSIS — Z20822 Contact with and (suspected) exposure to covid-19: Secondary | ICD-10-CM | POA: Diagnosis not present

## 2020-12-21 DIAGNOSIS — R531 Weakness: Secondary | ICD-10-CM | POA: Diagnosis not present

## 2020-12-21 DIAGNOSIS — R293 Abnormal posture: Secondary | ICD-10-CM | POA: Diagnosis not present

## 2020-12-21 DIAGNOSIS — M542 Cervicalgia: Secondary | ICD-10-CM | POA: Diagnosis not present

## 2020-12-21 DIAGNOSIS — M546 Pain in thoracic spine: Secondary | ICD-10-CM | POA: Diagnosis not present

## 2020-12-21 DIAGNOSIS — M0589 Other rheumatoid arthritis with rheumatoid factor of multiple sites: Secondary | ICD-10-CM | POA: Diagnosis not present

## 2020-12-26 ENCOUNTER — Ambulatory Visit
Admission: RE | Admit: 2020-12-26 | Discharge: 2020-12-26 | Disposition: A | Discharge status: Home / Self Care | Payer: Medicare Other | Source: Ambulatory Visit | Attending: Nephrology | Admitting: Nephrology

## 2020-12-26 ENCOUNTER — Other Ambulatory Visit: Payer: Self-pay

## 2020-12-26 DIAGNOSIS — E1122 Type 2 diabetes mellitus with diabetic chronic kidney disease: Secondary | ICD-10-CM | POA: Diagnosis not present

## 2020-12-26 DIAGNOSIS — N281 Cyst of kidney, acquired: Secondary | ICD-10-CM | POA: Diagnosis not present

## 2020-12-26 DIAGNOSIS — N2581 Secondary hyperparathyroidism of renal origin: Secondary | ICD-10-CM

## 2020-12-26 DIAGNOSIS — N183 Chronic kidney disease, stage 3 unspecified: Secondary | ICD-10-CM

## 2020-12-26 DIAGNOSIS — I129 Hypertensive chronic kidney disease with stage 1 through stage 4 chronic kidney disease, or unspecified chronic kidney disease: Secondary | ICD-10-CM | POA: Diagnosis not present

## 2020-12-26 DIAGNOSIS — N189 Chronic kidney disease, unspecified: Secondary | ICD-10-CM | POA: Diagnosis not present

## 2020-12-26 DIAGNOSIS — E782 Mixed hyperlipidemia: Secondary | ICD-10-CM | POA: Diagnosis not present

## 2020-12-27 DIAGNOSIS — R293 Abnormal posture: Secondary | ICD-10-CM | POA: Diagnosis not present

## 2020-12-27 DIAGNOSIS — R531 Weakness: Secondary | ICD-10-CM | POA: Diagnosis not present

## 2020-12-27 DIAGNOSIS — M542 Cervicalgia: Secondary | ICD-10-CM | POA: Diagnosis not present

## 2020-12-27 DIAGNOSIS — M546 Pain in thoracic spine: Secondary | ICD-10-CM | POA: Diagnosis not present

## 2021-01-02 DIAGNOSIS — E1122 Type 2 diabetes mellitus with diabetic chronic kidney disease: Secondary | ICD-10-CM | POA: Diagnosis not present

## 2021-01-02 DIAGNOSIS — E782 Mixed hyperlipidemia: Secondary | ICD-10-CM | POA: Diagnosis not present

## 2021-01-02 DIAGNOSIS — N401 Enlarged prostate with lower urinary tract symptoms: Secondary | ICD-10-CM | POA: Diagnosis not present

## 2021-01-02 DIAGNOSIS — I10 Essential (primary) hypertension: Secondary | ICD-10-CM | POA: Diagnosis not present

## 2021-01-02 DIAGNOSIS — N1832 Chronic kidney disease, stage 3b: Secondary | ICD-10-CM | POA: Diagnosis not present

## 2021-01-03 DIAGNOSIS — E782 Mixed hyperlipidemia: Secondary | ICD-10-CM | POA: Diagnosis not present

## 2021-01-03 DIAGNOSIS — D485 Neoplasm of uncertain behavior of skin: Secondary | ICD-10-CM | POA: Diagnosis not present

## 2021-01-03 DIAGNOSIS — L218 Other seborrheic dermatitis: Secondary | ICD-10-CM | POA: Diagnosis not present

## 2021-01-03 DIAGNOSIS — L259 Unspecified contact dermatitis, unspecified cause: Secondary | ICD-10-CM | POA: Diagnosis not present

## 2021-01-03 DIAGNOSIS — L732 Hidradenitis suppurativa: Secondary | ICD-10-CM | POA: Diagnosis not present

## 2021-01-03 DIAGNOSIS — I129 Hypertensive chronic kidney disease with stage 1 through stage 4 chronic kidney disease, or unspecified chronic kidney disease: Secondary | ICD-10-CM | POA: Diagnosis not present

## 2021-01-03 DIAGNOSIS — E1122 Type 2 diabetes mellitus with diabetic chronic kidney disease: Secondary | ICD-10-CM | POA: Diagnosis not present

## 2021-01-04 DIAGNOSIS — R293 Abnormal posture: Secondary | ICD-10-CM | POA: Diagnosis not present

## 2021-01-04 DIAGNOSIS — R531 Weakness: Secondary | ICD-10-CM | POA: Diagnosis not present

## 2021-01-04 DIAGNOSIS — M542 Cervicalgia: Secondary | ICD-10-CM | POA: Diagnosis not present

## 2021-01-04 DIAGNOSIS — M546 Pain in thoracic spine: Secondary | ICD-10-CM | POA: Diagnosis not present

## 2021-01-05 DIAGNOSIS — M546 Pain in thoracic spine: Secondary | ICD-10-CM | POA: Diagnosis not present

## 2021-01-05 DIAGNOSIS — M542 Cervicalgia: Secondary | ICD-10-CM | POA: Diagnosis not present

## 2021-01-05 DIAGNOSIS — R531 Weakness: Secondary | ICD-10-CM | POA: Diagnosis not present

## 2021-01-05 DIAGNOSIS — R293 Abnormal posture: Secondary | ICD-10-CM | POA: Diagnosis not present

## 2021-01-08 DIAGNOSIS — Z79899 Other long term (current) drug therapy: Secondary | ICD-10-CM | POA: Diagnosis not present

## 2021-01-08 DIAGNOSIS — M109 Gout, unspecified: Secondary | ICD-10-CM | POA: Diagnosis not present

## 2021-01-08 DIAGNOSIS — M25562 Pain in left knee: Secondary | ICD-10-CM | POA: Diagnosis not present

## 2021-01-08 DIAGNOSIS — M25462 Effusion, left knee: Secondary | ICD-10-CM | POA: Diagnosis not present

## 2021-01-08 DIAGNOSIS — M542 Cervicalgia: Secondary | ICD-10-CM | POA: Diagnosis not present

## 2021-01-08 DIAGNOSIS — M1120 Other chondrocalcinosis, unspecified site: Secondary | ICD-10-CM | POA: Diagnosis not present

## 2021-01-08 DIAGNOSIS — M0589 Other rheumatoid arthritis with rheumatoid factor of multiple sites: Secondary | ICD-10-CM | POA: Diagnosis not present

## 2021-01-08 DIAGNOSIS — N289 Disorder of kidney and ureter, unspecified: Secondary | ICD-10-CM | POA: Diagnosis not present

## 2021-01-09 DIAGNOSIS — R531 Weakness: Secondary | ICD-10-CM | POA: Diagnosis not present

## 2021-01-09 DIAGNOSIS — M546 Pain in thoracic spine: Secondary | ICD-10-CM | POA: Diagnosis not present

## 2021-01-09 DIAGNOSIS — R293 Abnormal posture: Secondary | ICD-10-CM | POA: Diagnosis not present

## 2021-01-09 DIAGNOSIS — M542 Cervicalgia: Secondary | ICD-10-CM | POA: Diagnosis not present

## 2021-01-17 DIAGNOSIS — I129 Hypertensive chronic kidney disease with stage 1 through stage 4 chronic kidney disease, or unspecified chronic kidney disease: Secondary | ICD-10-CM | POA: Diagnosis not present

## 2021-01-17 DIAGNOSIS — N182 Chronic kidney disease, stage 2 (mild): Secondary | ICD-10-CM | POA: Diagnosis not present

## 2021-01-17 DIAGNOSIS — E782 Mixed hyperlipidemia: Secondary | ICD-10-CM | POA: Diagnosis not present

## 2021-01-17 DIAGNOSIS — E1122 Type 2 diabetes mellitus with diabetic chronic kidney disease: Secondary | ICD-10-CM | POA: Diagnosis not present

## 2021-02-01 DIAGNOSIS — Z20822 Contact with and (suspected) exposure to covid-19: Secondary | ICD-10-CM | POA: Diagnosis not present

## 2021-02-05 DIAGNOSIS — Z96651 Presence of right artificial knee joint: Secondary | ICD-10-CM | POA: Diagnosis not present

## 2021-02-05 DIAGNOSIS — M25562 Pain in left knee: Secondary | ICD-10-CM | POA: Diagnosis not present

## 2021-02-05 DIAGNOSIS — M069 Rheumatoid arthritis, unspecified: Secondary | ICD-10-CM | POA: Diagnosis not present

## 2021-02-06 DIAGNOSIS — Z96651 Presence of right artificial knee joint: Secondary | ICD-10-CM | POA: Diagnosis not present

## 2021-02-06 DIAGNOSIS — M25561 Pain in right knee: Secondary | ICD-10-CM | POA: Diagnosis not present

## 2021-02-06 DIAGNOSIS — M1712 Unilateral primary osteoarthritis, left knee: Secondary | ICD-10-CM | POA: Diagnosis not present

## 2021-02-12 DIAGNOSIS — I129 Hypertensive chronic kidney disease with stage 1 through stage 4 chronic kidney disease, or unspecified chronic kidney disease: Secondary | ICD-10-CM | POA: Diagnosis not present

## 2021-02-12 DIAGNOSIS — E1122 Type 2 diabetes mellitus with diabetic chronic kidney disease: Secondary | ICD-10-CM | POA: Diagnosis not present

## 2021-02-12 DIAGNOSIS — M0589 Other rheumatoid arthritis with rheumatoid factor of multiple sites: Secondary | ICD-10-CM | POA: Diagnosis not present

## 2021-02-13 DIAGNOSIS — I129 Hypertensive chronic kidney disease with stage 1 through stage 4 chronic kidney disease, or unspecified chronic kidney disease: Secondary | ICD-10-CM | POA: Diagnosis not present

## 2021-02-13 DIAGNOSIS — E782 Mixed hyperlipidemia: Secondary | ICD-10-CM | POA: Diagnosis not present

## 2021-02-13 DIAGNOSIS — L732 Hidradenitis suppurativa: Secondary | ICD-10-CM | POA: Diagnosis not present

## 2021-02-13 DIAGNOSIS — E1122 Type 2 diabetes mellitus with diabetic chronic kidney disease: Secondary | ICD-10-CM | POA: Diagnosis not present

## 2021-02-13 DIAGNOSIS — L700 Acne vulgaris: Secondary | ICD-10-CM | POA: Diagnosis not present

## 2021-02-13 DIAGNOSIS — L2084 Intrinsic (allergic) eczema: Secondary | ICD-10-CM | POA: Diagnosis not present

## 2021-02-16 DIAGNOSIS — N182 Chronic kidney disease, stage 2 (mild): Secondary | ICD-10-CM | POA: Diagnosis not present

## 2021-02-16 DIAGNOSIS — E1122 Type 2 diabetes mellitus with diabetic chronic kidney disease: Secondary | ICD-10-CM | POA: Diagnosis not present

## 2021-02-16 DIAGNOSIS — E782 Mixed hyperlipidemia: Secondary | ICD-10-CM | POA: Diagnosis not present

## 2021-02-16 DIAGNOSIS — I129 Hypertensive chronic kidney disease with stage 1 through stage 4 chronic kidney disease, or unspecified chronic kidney disease: Secondary | ICD-10-CM | POA: Diagnosis not present

## 2021-02-20 DIAGNOSIS — Z96651 Presence of right artificial knee joint: Secondary | ICD-10-CM | POA: Diagnosis not present

## 2021-02-20 DIAGNOSIS — M25461 Effusion, right knee: Secondary | ICD-10-CM | POA: Diagnosis not present

## 2021-02-20 DIAGNOSIS — M069 Rheumatoid arthritis, unspecified: Secondary | ICD-10-CM | POA: Diagnosis not present

## 2021-03-01 DIAGNOSIS — I129 Hypertensive chronic kidney disease with stage 1 through stage 4 chronic kidney disease, or unspecified chronic kidney disease: Secondary | ICD-10-CM | POA: Diagnosis not present

## 2021-03-01 DIAGNOSIS — E1122 Type 2 diabetes mellitus with diabetic chronic kidney disease: Secondary | ICD-10-CM | POA: Diagnosis not present

## 2021-03-01 DIAGNOSIS — E782 Mixed hyperlipidemia: Secondary | ICD-10-CM | POA: Diagnosis not present

## 2021-03-02 DIAGNOSIS — M069 Rheumatoid arthritis, unspecified: Secondary | ICD-10-CM | POA: Diagnosis not present

## 2021-03-02 DIAGNOSIS — M109 Gout, unspecified: Secondary | ICD-10-CM | POA: Diagnosis not present

## 2021-03-02 DIAGNOSIS — M25462 Effusion, left knee: Secondary | ICD-10-CM | POA: Diagnosis not present

## 2021-03-02 DIAGNOSIS — Z96651 Presence of right artificial knee joint: Secondary | ICD-10-CM | POA: Diagnosis not present

## 2021-03-19 DIAGNOSIS — N182 Chronic kidney disease, stage 2 (mild): Secondary | ICD-10-CM | POA: Diagnosis not present

## 2021-03-19 DIAGNOSIS — E782 Mixed hyperlipidemia: Secondary | ICD-10-CM | POA: Diagnosis not present

## 2021-03-19 DIAGNOSIS — E1122 Type 2 diabetes mellitus with diabetic chronic kidney disease: Secondary | ICD-10-CM | POA: Diagnosis not present

## 2021-03-19 DIAGNOSIS — I129 Hypertensive chronic kidney disease with stage 1 through stage 4 chronic kidney disease, or unspecified chronic kidney disease: Secondary | ICD-10-CM | POA: Diagnosis not present

## 2021-03-21 DIAGNOSIS — Z96659 Presence of unspecified artificial knee joint: Secondary | ICD-10-CM | POA: Diagnosis not present

## 2021-03-21 DIAGNOSIS — M069 Rheumatoid arthritis, unspecified: Secondary | ICD-10-CM | POA: Diagnosis not present

## 2021-03-21 DIAGNOSIS — M109 Gout, unspecified: Secondary | ICD-10-CM | POA: Diagnosis not present

## 2021-03-21 DIAGNOSIS — M1120 Other chondrocalcinosis, unspecified site: Secondary | ICD-10-CM | POA: Diagnosis not present

## 2021-04-02 DIAGNOSIS — N401 Enlarged prostate with lower urinary tract symptoms: Secondary | ICD-10-CM | POA: Diagnosis not present

## 2021-04-02 DIAGNOSIS — N1832 Chronic kidney disease, stage 3b: Secondary | ICD-10-CM | POA: Diagnosis not present

## 2021-04-02 DIAGNOSIS — I129 Hypertensive chronic kidney disease with stage 1 through stage 4 chronic kidney disease, or unspecified chronic kidney disease: Secondary | ICD-10-CM | POA: Diagnosis not present

## 2021-04-02 DIAGNOSIS — Z125 Encounter for screening for malignant neoplasm of prostate: Secondary | ICD-10-CM | POA: Diagnosis not present

## 2021-04-02 DIAGNOSIS — M25562 Pain in left knee: Secondary | ICD-10-CM | POA: Diagnosis not present

## 2021-04-02 DIAGNOSIS — E782 Mixed hyperlipidemia: Secondary | ICD-10-CM | POA: Diagnosis not present

## 2021-04-02 DIAGNOSIS — N289 Disorder of kidney and ureter, unspecified: Secondary | ICD-10-CM | POA: Diagnosis not present

## 2021-04-02 DIAGNOSIS — M0589 Other rheumatoid arthritis with rheumatoid factor of multiple sites: Secondary | ICD-10-CM | POA: Diagnosis not present

## 2021-04-02 DIAGNOSIS — M542 Cervicalgia: Secondary | ICD-10-CM | POA: Diagnosis not present

## 2021-04-02 DIAGNOSIS — M25462 Effusion, left knee: Secondary | ICD-10-CM | POA: Diagnosis not present

## 2021-04-02 DIAGNOSIS — E1122 Type 2 diabetes mellitus with diabetic chronic kidney disease: Secondary | ICD-10-CM | POA: Diagnosis not present

## 2021-04-02 DIAGNOSIS — D631 Anemia in chronic kidney disease: Secondary | ICD-10-CM | POA: Diagnosis not present

## 2021-04-02 DIAGNOSIS — M109 Gout, unspecified: Secondary | ICD-10-CM | POA: Diagnosis not present

## 2021-04-02 DIAGNOSIS — N2581 Secondary hyperparathyroidism of renal origin: Secondary | ICD-10-CM | POA: Diagnosis not present

## 2021-04-02 DIAGNOSIS — M1120 Other chondrocalcinosis, unspecified site: Secondary | ICD-10-CM | POA: Diagnosis not present

## 2021-04-02 DIAGNOSIS — N183 Chronic kidney disease, stage 3 unspecified: Secondary | ICD-10-CM | POA: Diagnosis not present

## 2021-04-02 DIAGNOSIS — Z79899 Other long term (current) drug therapy: Secondary | ICD-10-CM | POA: Diagnosis not present

## 2021-04-04 DIAGNOSIS — M0589 Other rheumatoid arthritis with rheumatoid factor of multiple sites: Secondary | ICD-10-CM | POA: Diagnosis not present

## 2021-04-04 DIAGNOSIS — R1310 Dysphagia, unspecified: Secondary | ICD-10-CM | POA: Diagnosis not present

## 2021-04-04 DIAGNOSIS — R197 Diarrhea, unspecified: Secondary | ICD-10-CM | POA: Diagnosis not present

## 2021-04-04 DIAGNOSIS — R634 Abnormal weight loss: Secondary | ICD-10-CM | POA: Diagnosis not present

## 2021-04-10 DIAGNOSIS — D72829 Elevated white blood cell count, unspecified: Secondary | ICD-10-CM | POA: Diagnosis not present

## 2021-04-10 DIAGNOSIS — M0589 Other rheumatoid arthritis with rheumatoid factor of multiple sites: Secondary | ICD-10-CM | POA: Diagnosis not present

## 2021-04-10 DIAGNOSIS — I129 Hypertensive chronic kidney disease with stage 1 through stage 4 chronic kidney disease, or unspecified chronic kidney disease: Secondary | ICD-10-CM | POA: Diagnosis not present

## 2021-04-10 DIAGNOSIS — N1832 Chronic kidney disease, stage 3b: Secondary | ICD-10-CM | POA: Diagnosis not present

## 2021-04-10 DIAGNOSIS — E782 Mixed hyperlipidemia: Secondary | ICD-10-CM | POA: Diagnosis not present

## 2021-04-10 DIAGNOSIS — N401 Enlarged prostate with lower urinary tract symptoms: Secondary | ICD-10-CM | POA: Diagnosis not present

## 2021-04-10 DIAGNOSIS — R351 Nocturia: Secondary | ICD-10-CM | POA: Diagnosis not present

## 2021-04-10 DIAGNOSIS — H40013 Open angle with borderline findings, low risk, bilateral: Secondary | ICD-10-CM | POA: Diagnosis not present

## 2021-04-10 DIAGNOSIS — E1122 Type 2 diabetes mellitus with diabetic chronic kidney disease: Secondary | ICD-10-CM | POA: Diagnosis not present

## 2021-04-18 DIAGNOSIS — N182 Chronic kidney disease, stage 2 (mild): Secondary | ICD-10-CM | POA: Diagnosis not present

## 2021-04-18 DIAGNOSIS — I129 Hypertensive chronic kidney disease with stage 1 through stage 4 chronic kidney disease, or unspecified chronic kidney disease: Secondary | ICD-10-CM | POA: Diagnosis not present

## 2021-04-18 DIAGNOSIS — E1122 Type 2 diabetes mellitus with diabetic chronic kidney disease: Secondary | ICD-10-CM | POA: Diagnosis not present

## 2021-04-18 DIAGNOSIS — E782 Mixed hyperlipidemia: Secondary | ICD-10-CM | POA: Diagnosis not present

## 2021-04-18 DIAGNOSIS — L732 Hidradenitis suppurativa: Secondary | ICD-10-CM | POA: Diagnosis not present

## 2021-04-18 DIAGNOSIS — L700 Acne vulgaris: Secondary | ICD-10-CM | POA: Diagnosis not present

## 2021-04-19 DIAGNOSIS — K449 Diaphragmatic hernia without obstruction or gangrene: Secondary | ICD-10-CM | POA: Diagnosis not present

## 2021-04-19 DIAGNOSIS — K222 Esophageal obstruction: Secondary | ICD-10-CM | POA: Diagnosis not present

## 2021-04-19 DIAGNOSIS — E1122 Type 2 diabetes mellitus with diabetic chronic kidney disease: Secondary | ICD-10-CM | POA: Diagnosis not present

## 2021-04-19 DIAGNOSIS — Q394 Esophageal web: Secondary | ICD-10-CM | POA: Diagnosis not present

## 2021-04-19 DIAGNOSIS — E782 Mixed hyperlipidemia: Secondary | ICD-10-CM | POA: Diagnosis not present

## 2021-04-19 DIAGNOSIS — I129 Hypertensive chronic kidney disease with stage 1 through stage 4 chronic kidney disease, or unspecified chronic kidney disease: Secondary | ICD-10-CM | POA: Diagnosis not present

## 2021-05-04 DIAGNOSIS — M0589 Other rheumatoid arthritis with rheumatoid factor of multiple sites: Secondary | ICD-10-CM | POA: Diagnosis not present

## 2021-05-16 DIAGNOSIS — K222 Esophageal obstruction: Secondary | ICD-10-CM | POA: Diagnosis not present

## 2021-05-16 DIAGNOSIS — K219 Gastro-esophageal reflux disease without esophagitis: Secondary | ICD-10-CM | POA: Diagnosis not present

## 2021-05-16 DIAGNOSIS — E782 Mixed hyperlipidemia: Secondary | ICD-10-CM | POA: Diagnosis not present

## 2021-05-16 DIAGNOSIS — R634 Abnormal weight loss: Secondary | ICD-10-CM | POA: Diagnosis not present

## 2021-05-16 DIAGNOSIS — I129 Hypertensive chronic kidney disease with stage 1 through stage 4 chronic kidney disease, or unspecified chronic kidney disease: Secondary | ICD-10-CM | POA: Diagnosis not present

## 2021-05-16 DIAGNOSIS — E1122 Type 2 diabetes mellitus with diabetic chronic kidney disease: Secondary | ICD-10-CM | POA: Diagnosis not present

## 2021-06-21 DIAGNOSIS — R0989 Other specified symptoms and signs involving the circulatory and respiratory systems: Secondary | ICD-10-CM | POA: Diagnosis not present

## 2021-06-21 DIAGNOSIS — B360 Pityriasis versicolor: Secondary | ICD-10-CM | POA: Diagnosis not present

## 2021-06-21 DIAGNOSIS — L732 Hidradenitis suppurativa: Secondary | ICD-10-CM | POA: Diagnosis not present

## 2021-06-21 DIAGNOSIS — L218 Other seborrheic dermatitis: Secondary | ICD-10-CM | POA: Diagnosis not present

## 2021-06-21 DIAGNOSIS — L700 Acne vulgaris: Secondary | ICD-10-CM | POA: Diagnosis not present

## 2021-06-21 DIAGNOSIS — Z8709 Personal history of other diseases of the respiratory system: Secondary | ICD-10-CM | POA: Diagnosis not present

## 2021-06-21 DIAGNOSIS — R058 Other specified cough: Secondary | ICD-10-CM | POA: Diagnosis not present

## 2021-06-26 DIAGNOSIS — L4050 Arthropathic psoriasis, unspecified: Secondary | ICD-10-CM | POA: Diagnosis not present

## 2021-06-26 DIAGNOSIS — M0589 Other rheumatoid arthritis with rheumatoid factor of multiple sites: Secondary | ICD-10-CM | POA: Diagnosis not present

## 2021-06-26 DIAGNOSIS — M109 Gout, unspecified: Secondary | ICD-10-CM | POA: Diagnosis not present

## 2021-06-26 DIAGNOSIS — M0579 Rheumatoid arthritis with rheumatoid factor of multiple sites without organ or systems involvement: Secondary | ICD-10-CM | POA: Diagnosis not present

## 2021-06-26 DIAGNOSIS — M1120 Other chondrocalcinosis, unspecified site: Secondary | ICD-10-CM | POA: Diagnosis not present

## 2021-06-26 DIAGNOSIS — M542 Cervicalgia: Secondary | ICD-10-CM | POA: Diagnosis not present

## 2021-06-26 DIAGNOSIS — N289 Disorder of kidney and ureter, unspecified: Secondary | ICD-10-CM | POA: Diagnosis not present

## 2021-06-26 DIAGNOSIS — Z79899 Other long term (current) drug therapy: Secondary | ICD-10-CM | POA: Diagnosis not present

## 2021-07-12 DIAGNOSIS — E782 Mixed hyperlipidemia: Secondary | ICD-10-CM | POA: Diagnosis not present

## 2021-07-12 DIAGNOSIS — Z0000 Encounter for general adult medical examination without abnormal findings: Secondary | ICD-10-CM | POA: Diagnosis not present

## 2021-07-12 DIAGNOSIS — E1122 Type 2 diabetes mellitus with diabetic chronic kidney disease: Secondary | ICD-10-CM | POA: Diagnosis not present

## 2021-07-12 DIAGNOSIS — N1832 Chronic kidney disease, stage 3b: Secondary | ICD-10-CM | POA: Diagnosis not present

## 2021-07-12 DIAGNOSIS — D72829 Elevated white blood cell count, unspecified: Secondary | ICD-10-CM | POA: Diagnosis not present

## 2021-07-17 DIAGNOSIS — E782 Mixed hyperlipidemia: Secondary | ICD-10-CM | POA: Diagnosis not present

## 2021-07-17 DIAGNOSIS — I129 Hypertensive chronic kidney disease with stage 1 through stage 4 chronic kidney disease, or unspecified chronic kidney disease: Secondary | ICD-10-CM | POA: Diagnosis not present

## 2021-07-17 DIAGNOSIS — E1122 Type 2 diabetes mellitus with diabetic chronic kidney disease: Secondary | ICD-10-CM | POA: Diagnosis not present

## 2021-07-17 DIAGNOSIS — N182 Chronic kidney disease, stage 2 (mild): Secondary | ICD-10-CM | POA: Diagnosis not present

## 2021-07-19 DIAGNOSIS — E1121 Type 2 diabetes mellitus with diabetic nephropathy: Secondary | ICD-10-CM | POA: Diagnosis not present

## 2021-07-19 DIAGNOSIS — I10 Essential (primary) hypertension: Secondary | ICD-10-CM | POA: Diagnosis not present

## 2021-07-19 DIAGNOSIS — Z23 Encounter for immunization: Secondary | ICD-10-CM | POA: Diagnosis not present

## 2021-07-19 DIAGNOSIS — R6889 Other general symptoms and signs: Secondary | ICD-10-CM | POA: Diagnosis not present

## 2021-07-19 DIAGNOSIS — E782 Mixed hyperlipidemia: Secondary | ICD-10-CM | POA: Diagnosis not present

## 2021-07-19 DIAGNOSIS — N1832 Chronic kidney disease, stage 3b: Secondary | ICD-10-CM | POA: Diagnosis not present

## 2021-07-19 DIAGNOSIS — L4050 Arthropathic psoriasis, unspecified: Secondary | ICD-10-CM | POA: Diagnosis not present

## 2021-07-19 DIAGNOSIS — M0579 Rheumatoid arthritis with rheumatoid factor of multiple sites without organ or systems involvement: Secondary | ICD-10-CM | POA: Diagnosis not present

## 2021-07-25 DIAGNOSIS — I129 Hypertensive chronic kidney disease with stage 1 through stage 4 chronic kidney disease, or unspecified chronic kidney disease: Secondary | ICD-10-CM | POA: Diagnosis not present

## 2021-07-25 DIAGNOSIS — E1165 Type 2 diabetes mellitus with hyperglycemia: Secondary | ICD-10-CM | POA: Diagnosis not present

## 2021-07-25 DIAGNOSIS — E782 Mixed hyperlipidemia: Secondary | ICD-10-CM | POA: Diagnosis not present

## 2021-07-25 DIAGNOSIS — M0589 Other rheumatoid arthritis with rheumatoid factor of multiple sites: Secondary | ICD-10-CM | POA: Diagnosis not present

## 2021-07-25 DIAGNOSIS — E1122 Type 2 diabetes mellitus with diabetic chronic kidney disease: Secondary | ICD-10-CM | POA: Diagnosis not present

## 2021-07-26 DIAGNOSIS — L02222 Furuncle of back [any part, except buttock]: Secondary | ICD-10-CM | POA: Diagnosis not present

## 2021-07-26 DIAGNOSIS — L02221 Furuncle of abdominal wall: Secondary | ICD-10-CM | POA: Diagnosis not present

## 2021-07-26 DIAGNOSIS — L02821 Furuncle of head [any part, except face]: Secondary | ICD-10-CM | POA: Diagnosis not present

## 2021-07-26 DIAGNOSIS — L218 Other seborrheic dermatitis: Secondary | ICD-10-CM | POA: Diagnosis not present

## 2021-07-26 DIAGNOSIS — L810 Postinflammatory hyperpigmentation: Secondary | ICD-10-CM | POA: Diagnosis not present

## 2021-07-26 DIAGNOSIS — L732 Hidradenitis suppurativa: Secondary | ICD-10-CM | POA: Diagnosis not present

## 2021-07-30 DIAGNOSIS — Z118 Encounter for screening for other infectious and parasitic diseases: Secondary | ICD-10-CM | POA: Diagnosis not present

## 2021-07-30 DIAGNOSIS — N189 Chronic kidney disease, unspecified: Secondary | ICD-10-CM | POA: Diagnosis not present

## 2021-07-30 DIAGNOSIS — Z113 Encounter for screening for infections with a predominantly sexual mode of transmission: Secondary | ICD-10-CM | POA: Diagnosis not present

## 2021-07-30 DIAGNOSIS — Z202 Contact with and (suspected) exposure to infections with a predominantly sexual mode of transmission: Secondary | ICD-10-CM | POA: Diagnosis not present

## 2021-07-30 DIAGNOSIS — N183 Chronic kidney disease, stage 3 unspecified: Secondary | ICD-10-CM | POA: Diagnosis not present

## 2021-07-30 DIAGNOSIS — I129 Hypertensive chronic kidney disease with stage 1 through stage 4 chronic kidney disease, or unspecified chronic kidney disease: Secondary | ICD-10-CM | POA: Diagnosis not present

## 2021-07-31 ENCOUNTER — Encounter (HOSPITAL_COMMUNITY): Payer: Self-pay | Admitting: Hematology

## 2021-07-31 ENCOUNTER — Encounter: Payer: Self-pay | Admitting: Cardiology

## 2021-07-31 ENCOUNTER — Ambulatory Visit: Payer: BLUE CROSS/BLUE SHIELD | Admitting: Cardiology

## 2021-07-31 ENCOUNTER — Other Ambulatory Visit: Payer: Self-pay

## 2021-07-31 VITALS — BP 133/67 | HR 63 | Temp 98.0°F | Resp 16 | Ht 68.0 in | Wt 166.0 lb

## 2021-07-31 DIAGNOSIS — R0602 Shortness of breath: Secondary | ICD-10-CM

## 2021-07-31 DIAGNOSIS — E1169 Type 2 diabetes mellitus with other specified complication: Secondary | ICD-10-CM | POA: Diagnosis not present

## 2021-07-31 DIAGNOSIS — I129 Hypertensive chronic kidney disease with stage 1 through stage 4 chronic kidney disease, or unspecified chronic kidney disease: Secondary | ICD-10-CM

## 2021-07-31 DIAGNOSIS — N183 Chronic kidney disease, stage 3 unspecified: Secondary | ICD-10-CM | POA: Diagnosis not present

## 2021-07-31 DIAGNOSIS — E785 Hyperlipidemia, unspecified: Secondary | ICD-10-CM | POA: Diagnosis not present

## 2021-07-31 DIAGNOSIS — E1122 Type 2 diabetes mellitus with diabetic chronic kidney disease: Secondary | ICD-10-CM | POA: Diagnosis not present

## 2021-07-31 DIAGNOSIS — N1831 Chronic kidney disease, stage 3a: Secondary | ICD-10-CM | POA: Diagnosis not present

## 2021-07-31 NOTE — Progress Notes (Signed)
? ?Date:  07/31/2021  ? ?ID:  Kevin Lopez, DOB 1950/09/10, MRN [id number] ? ?PCP:  [name], MD  ?Cardiologist:  Rex Kras, DO, [facility] (established care 07/31/2021) ? ?REASON FOR CONSULT: Shortness of breath ? ?REQUESTING PHYSICIAN:  ?[name], MD ?[place],  Kevin [place] [id number] ? ?Chief Complaint  ?Patient presents with  ? Shortness of Breath  ? New Patient (Initial Visit)  ? ? ?HPI  ?Kevin Lopez is a 71 y.o. 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 ? ?He is referred to the office at the request of [name], MD for evaluation of breath. ? ?Over the last several months patient has noticed shortness of breath with overexertion.  Patient states that he tries to stay active on a daily basis.  He enjoys going up and down the stairs and doing a stationary bike.  However when he pushes himself to direction he starts having hard time breathing, palpitations, lightheaded, and dizziness.  Symptoms last for about 30 to 40 minutes, resolves with resting, last episode yesterday.  He has noticed the symptoms to be more progressive over the last few months and now presents to the office for evaluation and management. ? ? ?FUNCTIONAL STATUS: ?Three to four times a week (stationary bike and stairs).   ? ?ALLERGIES: ?Allergies  ?Allergen Reactions  ? Latex Shortness Of Breath  ? ? ?MEDICATION LIST PRIOR TO VISIT: ?Current Meds  ?Medication Sig  ? adapalene (DIFFERIN) 0.1 % cream Apply 1 application topically daily as needed.  ? albuterol (PROVENTIL HFA;VENTOLIN HFA) 108 (90 Base) MCG/ACT inhaler Inhale 2 puffs into the lungs every 6 (six) hours as needed for wheezing or shortness of breath.  ? amLODipine  (NORVASC) 10 MG tablet Take 10 mg by mouth daily.  ? Ascorbic Acid (VITAMIN C) 1000 MG tablet Take 1,000 mg by mouth daily.  ? B Complex Vitamins (VITAMIN B COMPLEX) TABS 1 tablet  ? bisacodyl (FLEET) 10 MG/30ML ENEM Fleet Bisacodyl 10 mg/30 mL enema ? Insert 37 mL by rectal route.  ? brompheniramine-pseudoephedrine-DM 30-2-10 MG/5ML syrup Take by mouth.  ? carvedilol (COREG) 12.5 MG tablet Take 3.125 mg by mouth 2 (two) times daily with a meal.  ? Cetirizine HCl (ZYRTEC ALLERGY) 10 MG CAPS Zyrtec 10 mg capsule ? Take by oral route.  ? Cinnamon 500 MG capsule Take 500 mg by mouth daily.  ? clonazePAM (KLONOPIN) 1 MG tablet Take 1 mg by mouth as needed for anxiety.  ? Coenzyme Q10 (CO Q10) 100 MG CAPS Take 100 mg by mouth 2 (two) times daily.  ? dapagliflozin propanediol (FARXIGA) 5 MG TABS tablet 1 tablet  ? diclofenac Sodium (VOLTAREN) 1 % GEL Apply 2 g topically 4 (four) times daily. Rub into affected area of foot 2 to 4 times daily  ? ergocalciferol (VITAMIN D2) 1.25 MG (50000 UT) capsule ergocalciferol (vitamin D2) 1,250 mcg (50,000 unit) capsule  ? fluticasone (FLONASE) 50 MCG/ACT nasal spray 1 spray in each nostril  ? folic acid (FOLVITE) 1 MG tablet folic acid 1 mg tablet  ? glipiZIDE (GLUCOTROL XL) 5 MG 24 hr tablet Take 5 mg by mouth daily with breakfast.  ? hydrALAZINE (APRESOLINE) 25 MG tablet Take 25 mg by mouth daily.  ? HYDROcodone-acetaminophen (NORCO/VICODIN) 5-325 MG tablet hydrocodone 5 mg-acetaminophen 325  mg tablet ? TAKE 1 TABLET BY MOUTH EVERY 6 HOURS FOR 5 DAYS  ? hydrOXYzine (ATARAX/VISTARIL) 25 MG tablet Take 25 mg by mouth 2 (two) times daily.  ? ketoconazole (NIZORAL) 2 % cream Apply 1 application topically daily as needed for irritation.   ? linaclotide (LINZESS) 72 MCG capsule Linzess 72 mcg capsule  ? linagliptin (TRADJENTA) 5 MG TABS tablet 1 tablet  ? lovastatin (MEVACOR) 20 MG tablet   ? magnesium gluconate (MAGONATE) 500 MG tablet Take 500 mg by mouth daily. Take 250 mg daily  ?  Multiple Vitamin (MULTIVITAMIN WITH MINERALS) TABS tablet Take 1 tablet by mouth daily.  ? Omega-3 Fatty Acids (FISH OIL) 1200 MG CAPS Take by mouth every other day.  ? predniSONE (DELTASONE) 5 MG tablet prednisone 5 mg tablet  ? telmisartan (MICARDIS) 40 MG tablet Take 40 mg by mouth daily.  ?  ? ?PAST MEDICAL HISTORY: ?Past Medical History:  ?Diagnosis Date  ? Chronic kidney disease   ? Diabetes mellitus without complication ([place])   ? Diverticulosis   ? Hyperlipidemia   ? Hypertension   ? RA (rheumatoid arthritis) ([place])   ? SBO (small bowel obstruction) s/p ex lap in past 12/15/2015  ? ? ?PAST SURGICAL HISTORY: ?Past Surgical History:  ?Procedure Laterality Date  ? APPENDECTOMY    ? COLOSTOMY TAKEDOWN    ? [place]  ? FLEXIBLE SIGMOIDOSCOPY N/A 12/15/2015  ? Procedure: FLEXIBLE SIGMOIDOSCOPY;  Surgeon: Danie Binder, MD;  [facility];  Service: Endoscopy;  Laterality: N/A;  ? KNEE SURGERY Right   ? LEFT COLECTOMY    ? [place]  ? LYSIS OF ADHESION    ? SBO  ? ? ?FAMILY HISTORY: ?The patient family history includes Cancer in his mother; Heart attack in his father; Hypertension in his brother and sister. ? ?SOCIAL HISTORY:  ?The patient  reports that he has never smoked. He has never used smokeless tobacco. He reports current alcohol use. He reports that he does not use drugs. ? ?REVIEW OF SYSTEMS: ?Review of Systems  ?Cardiovascular:  Negative for chest pain, dyspnea on exertion, leg swelling, orthopnea, palpitations, paroxysmal nocturnal dyspnea and syncope.  ?Respiratory:  Positive for shortness of breath.   ? ?PHYSICAL EXAM: ?Vitals with BMI 07/31/2021 04/18/2020 07/23/2019  ?Height '5\' 8"'$  - -  ?Weight 166 lbs 171 lbs 5 oz -  ?BMI 25.25 - -  ?Systolic 397 673 419  ?Diastolic 67 72 63  ?Pulse 63 70 62  ? ? ?CONSTITUTIONAL: Well-developed and well-nourished. No acute distress.  ?SKIN: Skin is warm and dry. No rash noted. No cyanosis. No pallor. No jaundice ?HEAD: Normocephalic and atraumatic.  ?EYES: No scleral  icterus ?MOUTH/THROAT: Moist oral membranes.  ?NECK: No JVD present. No thyromegaly noted. No carotid bruits  ?LYMPHATIC: No visible cervical adenopathy.  ?CHEST Normal respiratory effort. No intercostal retractions  ?LUNGS: Clear to auscultation bilaterally.  No stridor. No wheezes. No rales.  ?CARDIOVASCULAR: Regular rate and rhythm, positive S1-S2, no murmurs rubs or gallops appreciated. ?ABDOMINAL: Soft, nontender, nondistended, positive bowel sounds in all 4 quadrants, no apparent ascites.  ?EXTREMITIES: No peripheral edema, warm to touch, 2+ bilateral DP and PT pulses ?HEMATOLOGIC: No significant bruising ?NEUROLOGIC: Oriented to person, place, and time. Nonfocal. Normal muscle tone.  ?PSYCHIATRIC: Normal mood and affect. Normal behavior. Cooperative ? ?CARDIAC DATABASE: ?EKG: ?07/31/2021: Sinus bradycardia, 59 bpm, without underlying ischemia or injury pattern.  ? ?Echocardiogram: ?No results found for this or any previous visit from the past 1095  days. ?  ? ?Stress Testing: ?No results found for this or any previous visit from the past 1095 days. ? ? ?Heart Catheterization: ?None ? ?LABORATORY DATA: ?Comprehensive Metabolic Panel (CMP)   0355-97-41    ?A/G Ratio 1.9   1.1-2.5  ?Albumin 4.3   3.5-5.3  ?Alkaline Phosphatase 63   40-129  ?ALT (SGPT) 12   <5-55  ?AST (SGOT) 17   <5-46  ?Bilirubin, Total 0.2   <0.2-1.2  ?BUN 21   8-23  ?Calcium 9.8   8.6-10.4  ?Chloride 104   97-108  ?CO2 25   22-32  ?Creatinine 1.63   0.70-1.30  ?Estimated GFR (Black) 49   >59  ?Estimated GFR (Other) 42   >59  ?Glucose 149   65-99  ?Potassium 4.9   3.5-5.3  ?Protein 6.6   6.0-8.3  ?Sodium 140   135-145  ?Hemoglobin A1C   2021-07-12    ?Estimated Average Glucose 137      ?Hemoglobin A1C 6.4   <5.7  ?Lipid Panel   2021-07-12    ?Cholesterol 192   <200  ?Cholesterol / HDL Ratio 4.00   0.00-4.99  ?HDL Cholesterol 48   >39  ?LDL Cholesterol (Calculation) 126   <130  ?LDL/HDL Ratio 2.6   <3.3  ?Non-HDL Cholesterol 144   <130   ?Triglycerides 90   <150  ?TSH   2021-07-12    ?TSH 2.18   0.43-5.25  ?Microalbumin/Creatinine, Random Urine Sample   2021-07-12    ?Albumin/Creatinine Ratio, Urine 62   0-30  ?Creatinine, Urine 94.5      ?Microalbumin

## 2021-08-01 LAB — HEMOGLOBIN AND HEMATOCRIT, BLOOD
Hematocrit: 31.8 % — ABNORMAL LOW (ref 37.5–51.0)
Hemoglobin: 10.2 g/dL — ABNORMAL LOW (ref 13.0–17.7)

## 2021-08-03 ENCOUNTER — Other Ambulatory Visit: Payer: Self-pay

## 2021-08-03 DIAGNOSIS — R0602 Shortness of breath: Secondary | ICD-10-CM

## 2021-08-06 ENCOUNTER — Ambulatory Visit: Payer: Medicare Other

## 2021-08-06 ENCOUNTER — Other Ambulatory Visit: Payer: Self-pay

## 2021-08-06 DIAGNOSIS — D649 Anemia, unspecified: Secondary | ICD-10-CM | POA: Diagnosis not present

## 2021-08-06 DIAGNOSIS — R0602 Shortness of breath: Secondary | ICD-10-CM

## 2021-08-07 DIAGNOSIS — I129 Hypertensive chronic kidney disease with stage 1 through stage 4 chronic kidney disease, or unspecified chronic kidney disease: Secondary | ICD-10-CM | POA: Diagnosis not present

## 2021-08-07 DIAGNOSIS — N183 Chronic kidney disease, stage 3 unspecified: Secondary | ICD-10-CM | POA: Diagnosis not present

## 2021-08-07 DIAGNOSIS — D631 Anemia in chronic kidney disease: Secondary | ICD-10-CM | POA: Diagnosis not present

## 2021-08-07 DIAGNOSIS — N2581 Secondary hyperparathyroidism of renal origin: Secondary | ICD-10-CM | POA: Diagnosis not present

## 2021-08-07 LAB — BASIC METABOLIC PANEL
BUN/Creatinine Ratio: 17 (ref 10–24)
BUN: 29 mg/dL — ABNORMAL HIGH (ref 8–27)
CO2: 19 mmol/L — ABNORMAL LOW (ref 20–29)
Calcium: 9.9 mg/dL (ref 8.6–10.2)
Chloride: 103 mmol/L (ref 96–106)
Creatinine, Ser: 1.7 mg/dL — ABNORMAL HIGH (ref 0.76–1.27)
Glucose: 186 mg/dL — ABNORMAL HIGH (ref 70–99)
Potassium: 5.3 mmol/L — ABNORMAL HIGH (ref 3.5–5.2)
Sodium: 140 mmol/L (ref 134–144)
eGFR: 43 mL/min/{1.73_m2} — ABNORMAL LOW (ref >59)

## 2021-08-07 LAB — PRO B NATRIURETIC PEPTIDE: NT-Pro BNP: 327 pg/mL (ref 0–376)

## 2021-08-07 NOTE — Progress Notes (Signed)
Patient is aware to go get remaining labs

## 2021-08-10 NOTE — Progress Notes (Signed)
Pt called back he is aware of results

## 2021-08-10 NOTE — Progress Notes (Signed)
Tried calling patient no answer left a vm

## 2021-08-13 DIAGNOSIS — D638 Anemia in other chronic diseases classified elsewhere: Secondary | ICD-10-CM | POA: Diagnosis not present

## 2021-08-13 DIAGNOSIS — N1832 Chronic kidney disease, stage 3b: Secondary | ICD-10-CM | POA: Diagnosis not present

## 2021-08-14 DIAGNOSIS — Z20822 Contact with and (suspected) exposure to covid-19: Secondary | ICD-10-CM | POA: Diagnosis not present

## 2021-08-23 NOTE — Progress Notes (Signed)
Called and spoke to pt, pt voiced understanding.

## 2021-08-31 DIAGNOSIS — Z6824 Body mass index (BMI) 24.0-24.9, adult: Secondary | ICD-10-CM | POA: Diagnosis not present

## 2021-08-31 DIAGNOSIS — M542 Cervicalgia: Secondary | ICD-10-CM | POA: Diagnosis not present

## 2021-08-31 DIAGNOSIS — M545 Low back pain, unspecified: Secondary | ICD-10-CM | POA: Diagnosis not present

## 2021-08-31 DIAGNOSIS — M549 Dorsalgia, unspecified: Secondary | ICD-10-CM | POA: Diagnosis not present

## 2021-08-31 DIAGNOSIS — M62838 Other muscle spasm: Secondary | ICD-10-CM | POA: Diagnosis not present

## 2021-09-17 ENCOUNTER — Other Ambulatory Visit: Payer: Medicare Other

## 2021-09-17 DIAGNOSIS — Z20822 Contact with and (suspected) exposure to covid-19: Secondary | ICD-10-CM | POA: Diagnosis not present

## 2021-09-27 ENCOUNTER — Ambulatory Visit: Payer: Medicare Other | Admitting: Cardiology

## 2021-10-25 DIAGNOSIS — Z96659 Presence of unspecified artificial knee joint: Secondary | ICD-10-CM | POA: Diagnosis not present

## 2021-10-25 DIAGNOSIS — M1120 Other chondrocalcinosis, unspecified site: Secondary | ICD-10-CM | POA: Diagnosis not present

## 2021-10-25 DIAGNOSIS — M069 Rheumatoid arthritis, unspecified: Secondary | ICD-10-CM | POA: Diagnosis not present

## 2021-10-25 DIAGNOSIS — M109 Gout, unspecified: Secondary | ICD-10-CM | POA: Diagnosis not present

## 2021-10-26 DIAGNOSIS — E291 Testicular hypofunction: Secondary | ICD-10-CM | POA: Diagnosis not present

## 2021-10-26 DIAGNOSIS — Z125 Encounter for screening for malignant neoplasm of prostate: Secondary | ICD-10-CM | POA: Diagnosis not present

## 2021-10-26 DIAGNOSIS — R351 Nocturia: Secondary | ICD-10-CM | POA: Diagnosis not present

## 2021-10-26 DIAGNOSIS — N35919 Unspecified urethral stricture, male, unspecified site: Secondary | ICD-10-CM | POA: Diagnosis not present

## 2021-10-26 DIAGNOSIS — L732 Hidradenitis suppurativa: Secondary | ICD-10-CM | POA: Diagnosis not present

## 2021-10-26 DIAGNOSIS — R972 Elevated prostate specific antigen [PSA]: Secondary | ICD-10-CM | POA: Diagnosis not present

## 2021-10-26 DIAGNOSIS — R3915 Urgency of urination: Secondary | ICD-10-CM | POA: Diagnosis not present

## 2021-10-26 DIAGNOSIS — N500 Atrophy of testis: Secondary | ICD-10-CM | POA: Diagnosis not present

## 2021-10-26 DIAGNOSIS — N401 Enlarged prostate with lower urinary tract symptoms: Secondary | ICD-10-CM | POA: Diagnosis not present

## 2021-10-26 DIAGNOSIS — N529 Male erectile dysfunction, unspecified: Secondary | ICD-10-CM | POA: Diagnosis not present

## 2021-10-26 DIAGNOSIS — R350 Frequency of micturition: Secondary | ICD-10-CM | POA: Diagnosis not present

## 2021-10-26 DIAGNOSIS — R7309 Other abnormal glucose: Secondary | ICD-10-CM | POA: Diagnosis not present

## 2021-11-01 ENCOUNTER — Ambulatory Visit: Payer: Medicare Other | Admitting: Cardiology

## 2021-11-08 DIAGNOSIS — M0589 Other rheumatoid arthritis with rheumatoid factor of multiple sites: Secondary | ICD-10-CM | POA: Diagnosis not present

## 2021-11-08 DIAGNOSIS — Z1159 Encounter for screening for other viral diseases: Secondary | ICD-10-CM | POA: Diagnosis not present

## 2021-11-12 DIAGNOSIS — M0579 Rheumatoid arthritis with rheumatoid factor of multiple sites without organ or systems involvement: Secondary | ICD-10-CM | POA: Diagnosis not present

## 2021-11-12 DIAGNOSIS — N289 Disorder of kidney and ureter, unspecified: Secondary | ICD-10-CM | POA: Diagnosis not present

## 2021-11-12 DIAGNOSIS — M810 Age-related osteoporosis without current pathological fracture: Secondary | ICD-10-CM | POA: Diagnosis not present

## 2021-11-12 DIAGNOSIS — L4050 Arthropathic psoriasis, unspecified: Secondary | ICD-10-CM | POA: Diagnosis not present

## 2021-11-12 DIAGNOSIS — M542 Cervicalgia: Secondary | ICD-10-CM | POA: Diagnosis not present

## 2021-11-12 DIAGNOSIS — M25511 Pain in right shoulder: Secondary | ICD-10-CM | POA: Diagnosis not present

## 2021-11-12 DIAGNOSIS — M329 Systemic lupus erythematosus, unspecified: Secondary | ICD-10-CM | POA: Diagnosis not present

## 2021-11-12 DIAGNOSIS — Z79899 Other long term (current) drug therapy: Secondary | ICD-10-CM | POA: Diagnosis not present

## 2021-11-12 DIAGNOSIS — M109 Gout, unspecified: Secondary | ICD-10-CM | POA: Diagnosis not present

## 2021-11-12 DIAGNOSIS — M1120 Other chondrocalcinosis, unspecified site: Secondary | ICD-10-CM | POA: Diagnosis not present

## 2021-11-14 ENCOUNTER — Ambulatory Visit: Payer: Medicare Other

## 2021-11-14 DIAGNOSIS — E1122 Type 2 diabetes mellitus with diabetic chronic kidney disease: Secondary | ICD-10-CM | POA: Diagnosis not present

## 2021-11-14 DIAGNOSIS — E1169 Type 2 diabetes mellitus with other specified complication: Secondary | ICD-10-CM

## 2021-11-14 DIAGNOSIS — I129 Hypertensive chronic kidney disease with stage 1 through stage 4 chronic kidney disease, or unspecified chronic kidney disease: Secondary | ICD-10-CM | POA: Diagnosis not present

## 2021-11-14 DIAGNOSIS — N1831 Chronic kidney disease, stage 3a: Secondary | ICD-10-CM | POA: Diagnosis not present

## 2021-11-14 DIAGNOSIS — N183 Chronic kidney disease, stage 3 unspecified: Secondary | ICD-10-CM | POA: Diagnosis not present

## 2021-11-14 DIAGNOSIS — E785 Hyperlipidemia, unspecified: Secondary | ICD-10-CM | POA: Diagnosis not present

## 2021-11-14 DIAGNOSIS — R0602 Shortness of breath: Secondary | ICD-10-CM | POA: Diagnosis not present

## 2021-11-15 ENCOUNTER — Ambulatory Visit: Payer: Medicare Other | Admitting: Cardiology

## 2021-11-15 DIAGNOSIS — F5101 Primary insomnia: Secondary | ICD-10-CM | POA: Diagnosis not present

## 2021-11-15 DIAGNOSIS — E782 Mixed hyperlipidemia: Secondary | ICD-10-CM | POA: Diagnosis not present

## 2021-11-15 DIAGNOSIS — E1122 Type 2 diabetes mellitus with diabetic chronic kidney disease: Secondary | ICD-10-CM | POA: Diagnosis not present

## 2021-11-15 DIAGNOSIS — I129 Hypertensive chronic kidney disease with stage 1 through stage 4 chronic kidney disease, or unspecified chronic kidney disease: Secondary | ICD-10-CM | POA: Diagnosis not present

## 2021-11-15 DIAGNOSIS — L732 Hidradenitis suppurativa: Secondary | ICD-10-CM | POA: Diagnosis not present

## 2021-11-22 ENCOUNTER — Ambulatory Visit: Payer: Medicare Other | Admitting: Cardiology

## 2021-11-22 ENCOUNTER — Encounter: Payer: Self-pay | Admitting: Cardiology

## 2021-11-22 VITALS — BP 135/64 | HR 62 | Temp 97.6°F | Resp 16 | Ht 68.0 in | Wt 160.0 lb

## 2021-11-22 DIAGNOSIS — R0602 Shortness of breath: Secondary | ICD-10-CM | POA: Diagnosis not present

## 2021-11-22 DIAGNOSIS — N183 Chronic kidney disease, stage 3 unspecified: Secondary | ICD-10-CM

## 2021-11-22 DIAGNOSIS — E785 Hyperlipidemia, unspecified: Secondary | ICD-10-CM | POA: Diagnosis not present

## 2021-11-22 DIAGNOSIS — E1169 Type 2 diabetes mellitus with other specified complication: Secondary | ICD-10-CM

## 2021-11-22 DIAGNOSIS — N1831 Chronic kidney disease, stage 3a: Secondary | ICD-10-CM | POA: Diagnosis not present

## 2021-11-22 DIAGNOSIS — I129 Hypertensive chronic kidney disease with stage 1 through stage 4 chronic kidney disease, or unspecified chronic kidney disease: Secondary | ICD-10-CM | POA: Diagnosis not present

## 2021-11-22 DIAGNOSIS — E1122 Type 2 diabetes mellitus with diabetic chronic kidney disease: Secondary | ICD-10-CM | POA: Diagnosis not present

## 2021-11-22 NOTE — Progress Notes (Signed)
Date:  11/22/2021   ID:  Kevin Lopez, DOB 1950/06/22, MRN [id number]  PCP:  [name], MD  Cardiologist:  Rex Kras, DO, [facility] (established care 07/31/2021)  Date: 11/22/21 Last Office Visit: 07/31/2021  Chief Complaint  Patient presents with   Results   Follow-up    Reevaluation of shortness of breath    HPI  Kevin Lopez is a 71 y.o. 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  Referred to the practice for evaluation of shortness of breath.  Given his risk factors he underwent ischemic work-up as outlined below and no additional work-up is warranted as his symptoms are well is improved significantly since last office visit.  He states that he has minimal dyspnea on exertion with over exertional activities otherwise he is asymptomatic.  Patient is requesting assistance with blood pressure management and minimizing the number of antihypertensive medications.  This is currently being managed by PCP and nephrology.  Given his history of hyperkalemia patient has taken his liberty to stop telmisartan altogether.  He has been off of telmisartan for the last 3 to 4 months.   FUNCTIONAL STATUS: Three to four times a week (stationary bike and stairs).    ALLERGIES: Allergies  Allergen Reactions   Latex Shortness Of Breath    MEDICATION LIST PRIOR TO VISIT: Current Meds  Medication Sig   Abatacept (ORENCIA) 125 MG/ML SOSY Inject into the skin every 30 (thirty) days.   albuterol (PROVENTIL HFA;VENTOLIN HFA) 108 (90 Base) MCG/ACT inhaler Inhale 2 puffs into the lungs every 6 (six) hours as needed for wheezing or shortness of breath.   amLODipine (NORVASC) 10 MG tablet Take 10 mg by mouth daily.   Ascorbic Acid (VITAMIN C) 1000 MG tablet Take 1,000 mg by mouth daily.   carvedilol  (COREG) 12.5 MG tablet Take 12.5 mg by mouth 2 (two) times daily with a meal.   clonazePAM (KLONOPIN) 1 MG tablet Take 1 mg by mouth as needed for anxiety.   Coenzyme Q10 (CO Q10) 100 MG CAPS Take 100 mg by mouth as needed.   dapagliflozin propanediol (FARXIGA) 5 MG TABS tablet Take 5 mg by mouth daily.   ergocalciferol (VITAMIN D2) 1.25 MG (50000 UT) capsule ergocalciferol (vitamin D2) 1,250 mcg (50,000 unit) capsule   fluticasone (FLONASE) 50 MCG/ACT nasal spray 1 spray in each nostril   hydrALAZINE (APRESOLINE) 25 MG tablet Take 25 mg by mouth in the morning and at bedtime.   hydrOXYzine (ATARAX/VISTARIL) 25 MG tablet Take 25 mg by mouth daily as needed.   ketoconazole (NIZORAL) 2 % cream Apply 1 application topically daily as needed for irritation.    linagliptin (TRADJENTA) 5 MG TABS tablet 1 tablet   lovastatin (MEVACOR) 20 MG tablet Take 20 mg by mouth daily at 12 noon.   Multiple Vitamin (MULTIVITAMIN WITH MINERALS) TABS tablet Take 1 tablet by mouth daily.   tamsulosin (FLOMAX) 0.4 MG CAPS capsule      PAST MEDICAL HISTORY: Past Medical History:  Diagnosis Date   Chronic kidney disease    Diabetes mellitus without complication (HCC)    Diverticulosis    Hyperlipidemia    Hypertension    RA (rheumatoid arthritis) ([place])    SBO (small bowel obstruction) s/p ex lap in past 12/15/2015    PAST SURGICAL HISTORY: Past Surgical History:  Procedure Laterality Date   APPENDECTOMY  COLOSTOMY TAKEDOWN     [place]   FLEXIBLE SIGMOIDOSCOPY N/A 12/15/2015   Procedure: FLEXIBLE SIGMOIDOSCOPY;  Surgeon: Danie Binder, MD;  [facility];  Service: Endoscopy;  Laterality: N/A;   KNEE SURGERY Right    LEFT COLECTOMY     [place]   LYSIS OF ADHESION     SBO    FAMILY HISTORY: The patient family history includes Cancer in his mother; Heart attack in his father; Hypertension in his brother and sister.  SOCIAL HISTORY:  The patient  reports that he has never smoked. He has never  used smokeless tobacco. He reports current alcohol use. He reports that he does not use drugs.  REVIEW OF SYSTEMS: Review of Systems  Cardiovascular:  Negative for chest pain, dyspnea on exertion, leg swelling, orthopnea, palpitations, paroxysmal nocturnal dyspnea and syncope.  Respiratory:  Positive for shortness of breath.     PHYSICAL EXAM:    11/22/2021    3:17 PM 11/22/2021    3:15 PM 07/31/2021   12:49 PM  Vitals with BMI  Height  '5\' 8"'$  '5\' 8"'$   Weight  160 lbs 166 lbs  BMI  62.83 15.17  Systolic 616 073 710  Diastolic 64 68 67  Pulse 62 69 63    CONSTITUTIONAL: Well-developed and well-nourished. No acute distress.  SKIN: Skin is warm and dry. No rash noted. No cyanosis. No pallor. No jaundice HEAD: Normocephalic and atraumatic.  EYES: No scleral icterus MOUTH/THROAT: Moist oral membranes.  NECK: No JVD present. No thyromegaly noted. No carotid bruits  CHEST Normal respiratory effort. No intercostal retractions  LUNGS: Clear to auscultation bilaterally.  No stridor. No wheezes. No rales.  CARDIOVASCULAR: Regular rate and rhythm, positive S1-S2, no murmurs rubs or gallops appreciated. ABDOMINAL: Soft, nontender, nondistended, positive bowel sounds in all 4 quadrants, no apparent ascites.  EXTREMITIES: No peripheral edema, warm to touch, 2+ bilateral DP and PT pulses HEMATOLOGIC: No significant bruising NEUROLOGIC: Oriented to person, place, and time. Nonfocal. Normal muscle tone.  PSYCHIATRIC: Normal mood and affect. Normal behavior. Cooperative No significant change in physical examination at the last office visit.  CARDIAC DATABASE: EKG: 07/31/2021: Sinus bradycardia, 59 bpm, without underlying ischemia or injury pattern.   Echocardiogram: 08/06/2021: Normal LV systolic function with visual EF 60-65%. Left ventricle cavity is normal in size. Normal left ventricular wall thickness. Normal global wall motion. Normal diastolic filling pattern, normal LAP.  Trace tricuspid  regurgitation. No evidence of pulmonary hypertension. No prior study for comparison.   Stress Testing: Exercise Myoview stress test 11/14/2021: Exercise nuclear stress test was performed using Bruce protocol.  1 Day Rest and Stress images. Exercise time 5 minutes 55 seconds, achieved 7.05 METS, 85% APMHR.  Stress ECG negative for ischemia -of note, rare PVCs at rest and in recovery. Normal myocardial perfusion without evidence of reversible myocardial ischemia or prior infarct. Left ventricular size dilated, no significant regional wall motion abnormalities, calculated LVEF 61%. No prior studies for comparison. Low risk study.   Heart Catheterization: None  LABORATORY DATA: CBC With Platelet And Differential RS In-house   2021-11-08    Absolute Basophils 0.1   0.0-0.1  Absolute Eosinophils 1.3   0.0-0.6  Absolute Lymphocytes 3.4   0.9-3.6  Absolute Monocytes 0.9   0.3-1.0  Absolute Neutrophils 8.8   2.0-8.2  Basophils Automated 0.5   0.0-2.0  Eosinophils Automated 8.9   0.0-7.0  Hematocrit 31.6   37.0-47.0  Hemoglobin 10.2   13.5-18.0  Lymphocytes Automated 23.5   20.5-51.1  MCH 25.6   26.0-33.0  MCHC 32.3   32.0-36.0  MCV 79.2   80.0-100.0  Monocytes Automated 6.4   5.0-12.0  Neutrophils Automated 60.7   42.2-75.2  Platelet Count 393   140-400  RBC 3.99   4.20-5.40  RDW 49.3      WBC 14.4   4.5-11.0  Comprehensive Metabolic Panel RS In-house   2021-11-08    Albumin 3.7   3.4-5.0  Albumin/Globulin Ratio 1.1   1.1-2.5  Alkaline Phosphatase 72   25-150  ALT (SGPT) 16   <6-78  AST (SGOT) 11   0-40  Bilirubin, Total 0.2   0.2-1.0  BUN 26   7-18  BUN/Creatinine Ratio 14.9   11.0-26.0  Calcium 9.3   8.5-10.1  Chloride 102   98-107  CO2 24   21-32  Creatinine 1.75   0.70-1.30  GFR/Black 45   >59  GFR/White 39   >59  Globulin, Calculated 3.4   1.5-4.6  Glucose 192   74-106  Potassium 5.1   3.5-5.1  Protein 7.1   6.4-8.2  Sodium 137   136-145   LDL Levels: 88  mg/dL-12/26/2020. 101 mg/dL-04/02/2021. 126 mg/dL-07/12/2021  IMPRESSION:    ICD-10-CM   1. Shortness of breath  R06.02     2. Type 2 diabetes mellitus with stage 3a chronic kidney disease, without long-term current use of insulin (HCC)  E11.22    N18.31     3. Type 2 diabetes mellitus with hyperlipidemia (HCC)  E11.69    E78.5     4. Benign hypertension with CKD (chronic kidney disease) stage III (HCC)  I12.9    N18.30        RECOMMENDATIONS: Tyshan Enderle is a 71 y.o. African-American male whose past medical history and cardiac risk factors include: Essential hypertension, non-insulin-dependent diabetes mellitus type 2, hyperlipidemia, advanced age.  Shortness of breath Improved since last office visit. Chest x-rays are still pending. NT proBNP within normal limits. Clinically euvolemic on physical examination.  Shortness of breath likely secondary to elevated blood pressures in the past which is slowly improving. Given his risk factors as outlined above underwent an echo and stress test.  Results independently reviewed with the patient at today's visit.  No additional work-up is warranted as a stress test was overall low risk and LVEF is preserved and clinically doing well.  Type 2 diabetes mellitus with stage 3a chronic kidney disease, without long-term current use of insulin (HCC) We emphasized the importance of glycemic control. Patient is encouraged to restart telmisartan after discussing his labs and reviewing his blood pressure log with PCP. Continue statin therapy and Farxiga.  Type 2 diabetes mellitus with hyperlipidemia (HCC) Currently on lovastatin.   He denies myalgia or other side effects. Most recent lipids dated February 2023, independently reviewed as noted above. Recommend a goal LDL of less than 70 mg/dL at least given his underlying diabetes. Currently managed by primary care provider.  Benign hypertension with CKD (chronic kidney disease) stage III  ([place]) Office blood pressures are within acceptable range. Medications reconciled. Given his history of hyperkalemia patient at his liberty to stop telmisartan. Educated him on the importance of ARB for blood pressure and renal protection given his diabetes and CKD.   I agreed, hyperkalemia is one of the side effects and therefore needs to be monitor closely.  His blood pressures are currently being managed by PCP.  For now I have asked him to continue his current medical therapy and on current therapy keep a  log of his blood pressures and to review them with PCP at his upcoming office visit.  After reviewing his BP log would recommend reinitiating telmisartan and closer monitoring of potassium level.  We will defer blood pressure management to PCP for now.  FINAL MEDICATION LIST END OF ENCOUNTER: No orders of the defined types were placed in this encounter.    Current Outpatient Medications:    Abatacept (ORENCIA) 125 MG/ML SOSY, Inject into the skin every 30 (thirty) days., Disp: , Rfl:    albuterol (PROVENTIL HFA;VENTOLIN HFA) 108 (90 Base) MCG/ACT inhaler, Inhale 2 puffs into the lungs every 6 (six) hours as needed for wheezing or shortness of breath., Disp: , Rfl:    amLODipine (NORVASC) 10 MG tablet, Take 10 mg by mouth daily., Disp: , Rfl:    Ascorbic Acid (VITAMIN C) 1000 MG tablet, Take 1,000 mg by mouth daily., Disp: , Rfl:    carvedilol (COREG) 12.5 MG tablet, Take 12.5 mg by mouth 2 (two) times daily with a meal., Disp: , Rfl:    clonazePAM (KLONOPIN) 1 MG tablet, Take 1 mg by mouth as needed for anxiety., Disp: , Rfl:    Coenzyme Q10 (CO Q10) 100 MG CAPS, Take 100 mg by mouth as needed., Disp: , Rfl:    dapagliflozin propanediol (FARXIGA) 5 MG TABS tablet, Take 5 mg by mouth daily., Disp: , Rfl:    ergocalciferol (VITAMIN D2) 1.25 MG (50000 UT) capsule, ergocalciferol (vitamin D2) 1,250 mcg (50,000 unit) capsule, Disp: , Rfl:    fluticasone (FLONASE) 50 MCG/ACT nasal spray, 1  spray in each nostril, Disp: , Rfl:    hydrALAZINE (APRESOLINE) 25 MG tablet, Take 25 mg by mouth in the morning and at bedtime., Disp: , Rfl:    hydrOXYzine (ATARAX/VISTARIL) 25 MG tablet, Take 25 mg by mouth daily as needed., Disp: , Rfl:    ketoconazole (NIZORAL) 2 % cream, Apply 1 application topically daily as needed for irritation. , Disp: , Rfl: 2   linagliptin (TRADJENTA) 5 MG TABS tablet, 1 tablet, Disp: , Rfl:    lovastatin (MEVACOR) 20 MG tablet, Take 20 mg by mouth daily at 12 noon., Disp: , Rfl:    Multiple Vitamin (MULTIVITAMIN WITH MINERALS) TABS tablet, Take 1 tablet by mouth daily., Disp: , Rfl:    tamsulosin (FLOMAX) 0.4 MG CAPS capsule, , Disp: , Rfl:   No orders of the defined types were placed in this encounter.   There are no Patient Instructions on file for this visit.   --Continue cardiac medications as reconciled in final medication list. --Return in about 1 year (around 11/23/2022) for Yearly followup.. Or sooner if needed. --Continue follow-up with your primary care physician regarding the management of your other chronic comorbid conditions.  Patient's questions and concerns were addressed to his satisfaction. He voices understanding of the instructions provided during this encounter.   This note was created using a voice recognition software as a result there may be grammatical errors inadvertently enclosed that do not reflect the nature of this encounter. Every attempt is made to correct such errors.  Rex Kras, [place], [facility]  Pager: [phone] Office: [phone]

## 2021-11-29 DIAGNOSIS — N183 Chronic kidney disease, stage 3 unspecified: Secondary | ICD-10-CM | POA: Diagnosis not present

## 2021-11-29 DIAGNOSIS — N189 Chronic kidney disease, unspecified: Secondary | ICD-10-CM | POA: Diagnosis not present

## 2021-12-03 DIAGNOSIS — N2581 Secondary hyperparathyroidism of renal origin: Secondary | ICD-10-CM | POA: Diagnosis not present

## 2021-12-03 DIAGNOSIS — D72829 Elevated white blood cell count, unspecified: Secondary | ICD-10-CM | POA: Diagnosis not present

## 2021-12-03 DIAGNOSIS — F5101 Primary insomnia: Secondary | ICD-10-CM | POA: Diagnosis not present

## 2021-12-03 DIAGNOSIS — E1122 Type 2 diabetes mellitus with diabetic chronic kidney disease: Secondary | ICD-10-CM | POA: Diagnosis not present

## 2021-12-03 DIAGNOSIS — D631 Anemia in chronic kidney disease: Secondary | ICD-10-CM | POA: Diagnosis not present

## 2021-12-03 DIAGNOSIS — I129 Hypertensive chronic kidney disease with stage 1 through stage 4 chronic kidney disease, or unspecified chronic kidney disease: Secondary | ICD-10-CM | POA: Diagnosis not present

## 2021-12-03 DIAGNOSIS — N183 Chronic kidney disease, stage 3 unspecified: Secondary | ICD-10-CM | POA: Diagnosis not present

## 2021-12-03 DIAGNOSIS — R634 Abnormal weight loss: Secondary | ICD-10-CM | POA: Diagnosis not present

## 2021-12-04 DIAGNOSIS — N189 Chronic kidney disease, unspecified: Secondary | ICD-10-CM | POA: Diagnosis not present

## 2021-12-04 DIAGNOSIS — N183 Chronic kidney disease, stage 3 unspecified: Secondary | ICD-10-CM | POA: Diagnosis not present

## 2021-12-05 ENCOUNTER — Other Ambulatory Visit: Payer: Self-pay

## 2021-12-05 ENCOUNTER — Emergency Department (HOSPITAL_COMMUNITY): Payer: Medicare Other

## 2021-12-05 ENCOUNTER — Encounter (HOSPITAL_COMMUNITY): Payer: Self-pay | Admitting: Emergency Medicine

## 2021-12-05 ENCOUNTER — Encounter (HOSPITAL_COMMUNITY): Payer: Self-pay | Admitting: Hematology

## 2021-12-05 ENCOUNTER — Inpatient Hospital Stay (HOSPITAL_COMMUNITY)
Admission: EM | Admit: 2021-12-05 | Discharge: 2021-12-10 | DRG: 554 | Disposition: A | Discharge status: Home Health | Payer: Medicare Other | Attending: Family Medicine | Admitting: Family Medicine

## 2021-12-05 DIAGNOSIS — N1831 Chronic kidney disease, stage 3a: Secondary | ICD-10-CM | POA: Diagnosis not present

## 2021-12-05 DIAGNOSIS — M79605 Pain in left leg: Secondary | ICD-10-CM

## 2021-12-05 DIAGNOSIS — M25452 Effusion, left hip: Secondary | ICD-10-CM | POA: Diagnosis not present

## 2021-12-05 DIAGNOSIS — E119 Type 2 diabetes mellitus without complications: Secondary | ICD-10-CM | POA: Diagnosis not present

## 2021-12-05 DIAGNOSIS — F32A Depression, unspecified: Secondary | ICD-10-CM | POA: Diagnosis not present

## 2021-12-05 DIAGNOSIS — N183 Chronic kidney disease, stage 3 unspecified: Secondary | ICD-10-CM | POA: Diagnosis not present

## 2021-12-05 DIAGNOSIS — Z79899 Other long term (current) drug therapy: Secondary | ICD-10-CM

## 2021-12-05 DIAGNOSIS — R600 Localized edema: Secondary | ICD-10-CM | POA: Diagnosis not present

## 2021-12-05 DIAGNOSIS — F419 Anxiety disorder, unspecified: Secondary | ICD-10-CM | POA: Diagnosis present

## 2021-12-05 DIAGNOSIS — M009 Pyogenic arthritis, unspecified: Secondary | ICD-10-CM | POA: Diagnosis not present

## 2021-12-05 DIAGNOSIS — M25552 Pain in left hip: Secondary | ICD-10-CM | POA: Diagnosis not present

## 2021-12-05 DIAGNOSIS — I10 Essential (primary) hypertension: Secondary | ICD-10-CM | POA: Diagnosis present

## 2021-12-05 DIAGNOSIS — Z888 Allergy status to other drugs, medicaments and biological substances status: Secondary | ICD-10-CM

## 2021-12-05 DIAGNOSIS — M069 Rheumatoid arthritis, unspecified: Secondary | ICD-10-CM | POA: Diagnosis not present

## 2021-12-05 DIAGNOSIS — M11252 Other chondrocalcinosis, left hip: Principal | ICD-10-CM | POA: Diagnosis present

## 2021-12-05 DIAGNOSIS — D62 Acute posthemorrhagic anemia: Secondary | ICD-10-CM | POA: Diagnosis not present

## 2021-12-05 DIAGNOSIS — N189 Chronic kidney disease, unspecified: Secondary | ICD-10-CM | POA: Diagnosis not present

## 2021-12-05 DIAGNOSIS — E785 Hyperlipidemia, unspecified: Secondary | ICD-10-CM | POA: Diagnosis not present

## 2021-12-05 DIAGNOSIS — N182 Chronic kidney disease, stage 2 (mild): Secondary | ICD-10-CM | POA: Diagnosis present

## 2021-12-05 DIAGNOSIS — Z471 Aftercare following joint replacement surgery: Secondary | ICD-10-CM | POA: Diagnosis not present

## 2021-12-05 DIAGNOSIS — I129 Hypertensive chronic kidney disease with stage 1 through stage 4 chronic kidney disease, or unspecified chronic kidney disease: Secondary | ICD-10-CM | POA: Diagnosis present

## 2021-12-05 DIAGNOSIS — M25559 Pain in unspecified hip: Secondary | ICD-10-CM | POA: Diagnosis present

## 2021-12-05 DIAGNOSIS — Z96642 Presence of left artificial hip joint: Secondary | ICD-10-CM | POA: Diagnosis not present

## 2021-12-05 DIAGNOSIS — E1122 Type 2 diabetes mellitus with diabetic chronic kidney disease: Secondary | ICD-10-CM | POA: Diagnosis present

## 2021-12-05 DIAGNOSIS — Z91040 Latex allergy status: Secondary | ICD-10-CM | POA: Diagnosis not present

## 2021-12-05 DIAGNOSIS — M1612 Unilateral primary osteoarthritis, left hip: Secondary | ICD-10-CM | POA: Diagnosis present

## 2021-12-05 DIAGNOSIS — Z7984 Long term (current) use of oral hypoglycemic drugs: Secondary | ICD-10-CM

## 2021-12-05 DIAGNOSIS — D631 Anemia in chronic kidney disease: Secondary | ICD-10-CM | POA: Diagnosis not present

## 2021-12-05 DIAGNOSIS — Z8249 Family history of ischemic heart disease and other diseases of the circulatory system: Secondary | ICD-10-CM | POA: Diagnosis not present

## 2021-12-05 DIAGNOSIS — M1712 Unilateral primary osteoarthritis, left knee: Secondary | ICD-10-CM | POA: Diagnosis not present

## 2021-12-05 DIAGNOSIS — M79652 Pain in left thigh: Secondary | ICD-10-CM | POA: Diagnosis not present

## 2021-12-05 DIAGNOSIS — D849 Immunodeficiency, unspecified: Secondary | ICD-10-CM | POA: Diagnosis not present

## 2021-12-05 DIAGNOSIS — M7989 Other specified soft tissue disorders: Secondary | ICD-10-CM | POA: Diagnosis not present

## 2021-12-05 DIAGNOSIS — M545 Low back pain, unspecified: Secondary | ICD-10-CM | POA: Diagnosis not present

## 2021-12-05 LAB — BASIC METABOLIC PANEL
Anion gap: 9 (ref 5–15)
BUN: 32 mg/dL — ABNORMAL HIGH (ref 8–23)
CO2: 21 mmol/L — ABNORMAL LOW (ref 22–32)
Calcium: 9.2 mg/dL (ref 8.9–10.3)
Chloride: 107 mmol/L (ref 98–111)
Creatinine, Ser: 1.7 mg/dL — ABNORMAL HIGH (ref 0.61–1.24)
GFR, Estimated: 43 mL/min — ABNORMAL LOW (ref >60)
Glucose, Bld: 135 mg/dL — ABNORMAL HIGH (ref 70–99)
Potassium: 4.1 mmol/L (ref 3.5–5.1)
Sodium: 137 mmol/L (ref 135–145)

## 2021-12-05 LAB — CBC
HCT: 30.2 % — ABNORMAL LOW (ref 39.0–52.0)
Hemoglobin: 9.6 g/dL — ABNORMAL LOW (ref 13.0–17.0)
MCH: 25.9 pg — ABNORMAL LOW (ref 26.0–34.0)
MCHC: 31.8 g/dL (ref 30.0–36.0)
MCV: 81.4 fL (ref 80.0–100.0)
Platelets: 400 10*3/uL (ref 150–400)
RBC: 3.71 MIL/uL — ABNORMAL LOW (ref 4.22–5.81)
RDW: 17.5 % — ABNORMAL HIGH (ref 11.5–15.5)
WBC: 20.4 10*3/uL — ABNORMAL HIGH (ref 4.0–10.5)
nRBC: 0 % (ref 0.0–0.2)

## 2021-12-05 LAB — CK: Total CK: 113 U/L (ref 49–397)

## 2021-12-05 MED ORDER — FENTANYL CITRATE PF 50 MCG/ML IJ SOSY
50.0000 ug | PREFILLED_SYRINGE | Freq: Once | INTRAMUSCULAR | Status: AC
  Administered 2021-12-05: 50 ug via INTRAMUSCULAR
  Filled 2021-12-05: qty 1

## 2021-12-05 MED ORDER — DIAZEPAM 2 MG PO TABS
2.0000 mg | ORAL_TABLET | Freq: Once | ORAL | Status: AC
  Administered 2021-12-05: 2 mg via ORAL
  Filled 2021-12-05: qty 1

## 2021-12-05 MED ORDER — HYDROMORPHONE HCL 1 MG/ML IJ SOLN
1.0000 mg | Freq: Once | INTRAMUSCULAR | Status: AC
  Administered 2021-12-05: 1 mg via INTRAMUSCULAR
  Filled 2021-12-05: qty 1

## 2021-12-05 MED ORDER — KETOROLAC TROMETHAMINE 30 MG/ML IJ SOLN: 10.0000 mg | Freq: Once | INTRAMUSCULAR | Status: DC

## 2021-12-05 MED ORDER — FENTANYL CITRATE (PF) 100 MCG/2ML IJ SOLN
75.0000 ug | Freq: Once | INTRAMUSCULAR | Status: AC
  Administered 2021-12-05: 75 ug via INTRAMUSCULAR
  Filled 2021-12-05: qty 2

## 2021-12-05 MED ORDER — DEXAMETHASONE SODIUM PHOSPHATE 10 MG/ML IJ SOLN
20.0000 mg | Freq: Once | INTRAMUSCULAR | Status: AC
  Administered 2021-12-05: 20 mg via INTRAMUSCULAR
  Filled 2021-12-05: qty 2

## 2021-12-05 MED ORDER — DIAZEPAM 5 MG/ML IJ SOLN
2.0000 mg | Freq: Once | INTRAMUSCULAR | Status: AC
  Administered 2021-12-06: 2 mg via INTRAVENOUS
  Filled 2021-12-05: qty 2

## 2021-12-05 MED ORDER — FENTANYL CITRATE (PF) 100 MCG/2ML IJ SOLN: 75.0000 ug | Freq: Once | INTRAMUSCULAR | Status: DC

## 2021-12-05 MED ORDER — KETOROLAC TROMETHAMINE 15 MG/ML IJ SOLN
9.9000 mg | Freq: Once | INTRAMUSCULAR | Status: AC
  Administered 2021-12-05: 9.9 mg via INTRAMUSCULAR
  Filled 2021-12-05: qty 1

## 2021-12-05 NOTE — ED Provider Notes (Signed)
[facility] EMERGENCY DEPARTMENT Provider Note   CSN: [id number] Arrival date & time: 12/05/21  1732     History  Chief Complaint  Patient presents with   Leg Pain    Kellen Dutch is a 71 y.o. male with history of RA, DM. Patient presents to the ED for evaluation of left leg pain.  The patient states that yesterday morning he woke up with excruciating left upper leg pain.  The patient states that over the course of the last 2 days this pain has progressively worsened.  The patient has attempted to control the pain with ice packs, heat packs, ibuprofen and Tylenol without relief.  The patient denies any preceding event, trauma to account for this pain.  The patient denies any fevers, nausea or vomiting at home.  The patient denies any numbness or tingling into his left leg.   Leg Pain Associated symptoms: no fever        Home Medications Prior to Admission medications   Medication Sig Start Date End Date Taking? Authorizing Provider  Abatacept (ORENCIA) 125 MG/ML SOSY Inject into the skin every 30 (thirty) days.    [provider]  albuterol (PROVENTIL HFA;VENTOLIN HFA) 108 (90 Base) MCG/ACT inhaler Inhale 2 puffs into the lungs every 6 (six) hours as needed for wheezing or shortness of breath.    [provider]  amLODipine (NORVASC) 10 MG tablet Take 10 mg by mouth daily.    [provider]  Ascorbic Acid (VITAMIN C) 1000 MG tablet Take 1,000 mg by mouth daily.    [provider]  carvedilol (COREG) 12.5 MG tablet Take 12.5 mg by mouth 2 (two) times daily with a meal.    [provider]  clonazePAM (KLONOPIN) 1 MG tablet Take 1 mg by mouth as needed for anxiety.    [provider]  Coenzyme Q10 (CO Q10) 100 MG CAPS Take 100 mg by mouth as needed.    [provider]  dapagliflozin propanediol (FARXIGA) 5 MG TABS tablet Take 5 mg by mouth daily.    [provider]  ergocalciferol (VITAMIN D2) 1.25 MG  (50000 UT) capsule ergocalciferol (vitamin D2) 1,250 mcg (50,000 unit) capsule    [provider]  fluticasone (FLONASE) 50 MCG/ACT nasal spray 1 spray in each nostril 06/21/21   [provider]  hydrALAZINE (APRESOLINE) 25 MG tablet Take 25 mg by mouth in the morning and at bedtime.    [provider]  hydrOXYzine (ATARAX/VISTARIL) 25 MG tablet Take 25 mg by mouth daily as needed.    [provider]  ketoconazole (NIZORAL) 2 % cream Apply 1 application topically daily as needed for irritation.  11/22/15   [provider]  linagliptin (TRADJENTA) 5 MG TABS tablet 1 tablet    [provider]  lovastatin (MEVACOR) 20 MG tablet Take 20 mg by mouth daily at 12 noon. 05/04/19   [provider]  Multiple Vitamin (MULTIVITAMIN WITH MINERALS) TABS tablet Take 1 tablet by mouth daily.    [provider]  tamsulosin (FLOMAX) 0.4 MG CAPS capsule  08/17/19   [provider]      Allergies    Latex    Review of Systems   Review of Systems  Constitutional:  Negative for chills and fever.  Gastrointestinal:  Negative for abdominal pain, diarrhea, nausea and vomiting.  Musculoskeletal:  Positive for arthralgias and myalgias.  All other systems reviewed and are negative.   Physical Exam Updated Vital Signs BP Marland Kitchen)  162/85   Pulse 81   Temp 98.5 F (36.9 C) (Oral)   Resp 18   Ht '5\' 9"'$  (1.753 m)   Wt 73.9 kg   SpO2 99%   BMI 24.07 kg/m  Physical Exam Vitals and nursing note reviewed.  Constitutional:      General: He is not in acute distress.    Appearance: Normal appearance. He is not ill-appearing, toxic-appearing or diaphoretic.  HENT:     Head: Normocephalic and atraumatic.     Nose: Nose normal. No congestion.     Mouth/Throat:     Mouth: Mucous membranes are moist.     Pharynx: Oropharynx is clear.  Eyes:     Extraocular Movements: Extraocular movements intact.     Conjunctiva/sclera: Conjunctivae normal.      Pupils: Pupils are equal, round, and reactive to light.  Cardiovascular:     Rate and Rhythm: Normal rate and regular rhythm.  Pulmonary:     Effort: Pulmonary effort is normal.     Breath sounds: Normal breath sounds. No wheezing.  Abdominal:     General: Abdomen is flat. Bowel sounds are normal.     Palpations: Abdomen is soft.     Tenderness: There is no abdominal tenderness.  Musculoskeletal:     Cervical back: Normal range of motion and neck supple. No tenderness.     Left hip: Tenderness present. No deformity. Decreased range of motion.     Left upper leg: Tenderness present. No swelling or deformity.     Comments: Patient with diffuse medial pain to palpation.  There is no overlying skin change.  There is no deformity.  Skin:    General: Skin is warm and dry.     Capillary Refill: Capillary refill takes less than 2 seconds.  Neurological:     Mental Status: He is alert and oriented to person, place, and time.     GCS: GCS eye subscore is 4. GCS verbal subscore is 5. GCS motor subscore is 6.     Cranial Nerves: Cranial nerves 2-12 are intact. No cranial nerve deficit.     Sensory: Sensation is intact. No sensory deficit.     Motor: Motor function is intact. No weakness.     Coordination: Coordination is intact. Heel to [facility] Test normal.     ED Results / Procedures / Treatments   Labs (all labs ordered are listed, but only abnormal results are displayed) Labs Reviewed  CBC - Abnormal; Notable for the following components:      Result Value   WBC 20.4 (*)    RBC 3.71 (*)    Hemoglobin 9.6 (*)    HCT 30.2 (*)    MCH 25.9 (*)    RDW 17.5 (*)    All other components within normal limits  BASIC METABOLIC PANEL - Abnormal; Notable for the following components:   CO2 21 (*)    Glucose, Bld 135 (*)    BUN 32 (*)    Creatinine, Ser 1.70 (*)    GFR, Estimated 43 (*)    All other components within normal limits  CK  SEDIMENTATION RATE  C-REACTIVE PROTEIN     EKG None  Radiology DG Femur Min 2 Views Left  Result Date: 12/05/2021 CLINICAL DATA:  Pain EXAM: LEFT FEMUR 2 VIEWS COMPARISON:  None Available. FINDINGS: No fracture or dislocation is seen. Degenerative changes with small bony spurs are seen in left knee. Minimal bony spurs are seen in the medial aspect of the  left hip joint. There are no focal lytic lesions. IMPRESSION: No fracture or dislocation is seen. Degenerative changes are noted in left knee. Minimal bony spurs are seen in left hip. Electronically Signed   By: Elmer Picker M.D.   On: 12/05/2021 19:52    Procedures Procedures   Medications Ordered in ED Medications  diazepam (VALIUM) injection 2 mg (has no administration in time range)  fentaNYL (SUBLIMAZE) injection 50 mcg (50 mcg Intramuscular Given 12/05/21 1923)  diazepam (VALIUM) tablet 2 mg (2 mg Oral Given 12/05/21 2028)  dexamethasone (DECADRON) injection 20 mg (20 mg Intramuscular Given 12/05/21 2031)  ketorolac (TORADOL) 15 MG/ML injection 9.9 mg (9.9 mg Intramuscular Given 12/05/21 2034)  fentaNYL (SUBLIMAZE) injection 75 mcg (75 mcg Intramuscular Given 12/05/21 2028)  HYDROmorphone (DILAUDID) injection 1 mg (1 mg Intramuscular Given 12/05/21 2155)    ED Course/ Medical Decision Making/ A&P                           Medical Decision Making Amount and/or Complexity of Data Reviewed Labs: ordered. Radiology: ordered.  Risk Prescription drug management.   71 year old male presents to the ED for evaluation.  Please see HPI for further details.  On examination, the patient is afebrile and nontachycardic.  The patient lung sounds are clear bilaterally, he is not hypoxic on room air.  The patient abdomen is soft and compressible all 4 quadrants.  Patient neurological examination shows no focal neurodeficits.  The patient left medial thigh has no overlying skin change, deformity however there is excruciating tenderness to palpation.  Patient worked up  utilizing the following labs and imaging studies interpreted by me personally: - BMP with elevated creatinine of 1.7 which is in line with the patient's baseline creatinine - CBC with leukocytosis to 20.4.  The patient also with a decrease hemoglobin at 9.6 however this is in line with the patient's baseline - CK unremarkable at 113 - CRP pending - Sed rate pending - Plain film imaging of patient left femur shows no deformity but there are bone spurs noted - MR hip left, MR lumbar spine pending  The patient was provided with 9.9 mg Toradol due to patient decreased kidney function, 1 mg Dilaudid, 20 mg Decadron, 2 mg Valium, total of 125 mcg of fentanyl.  The patient states that after these medications were given he has no change in pain.  There is no alleviation of his symptoms.  At this time, after discussing with my attending Dr. Kathrynn Humble, we are unsure of the cause of the patient's symptoms.  There is some thought being given to the patient having septic Arthritis. The patient will be transferred to [facility] for further management and work-up, Dr. Doren Custard has agreed to accept the patient.  The patient is amenable to this plan, has been updated of the plan. Patient stable.    Final Clinical Impression(s) / ED Diagnoses Final diagnoses:  Left leg pain    Rx / DC Orders ED Discharge Orders     None         Azucena Cecil, PA-C 12/05/21 Crane, Ankit, MD 12/06/21 1521

## 2021-12-05 NOTE — ED Triage Notes (Signed)
Pt presents with left leg pain, radiating into groin area x 1 day.

## 2021-12-05 NOTE — ED Notes (Signed)
Patient transported to X-ray 

## 2021-12-05 NOTE — ED Provider Notes (Signed)
I provided a substantive portion of the care of this patient.  I personally performed the entirety of the history, exam, and medical decision making for this encounter.      Patient with history of rheumatoid arthritis and diabetes comes in with chief complaint of left leg pain.  He indicates that the pain started yesterday, it was tolerable.  He woke up with the pain, with no specific evoking reason.  He has no history of pain like this in the past.  As the day went on, the pain started getting worse.  This morning when he woke up, the pain was severe.  He went to his PCP and he was advised to come to the ER to rule out DVT.  Based on my exam, there is no concerns for DVT.  He does not have any symptoms of cauda equina.  On initial exam, patient is noted to have tenderness with palpation of the left hip and with passive leg raise.  Initial thought was that patient most likely has herniated disc, radiculopathy or severe hip arthritis that is driving the pain.  Patient received fentanyl and Dilaudid, however his pain remains severe.  [facility] had ordered x-ray of the femur which showed nothing too concerning.  We added lab work-up, and it was noted that patient has elevated white count over 20. He was reassessed, from pain perspective, and remains in significant pain.  With external and internal rotation of the hip he is having excruciating pain as well  At this time, plan is to get an MRI of his lumbar spine and MRI of his left hip.  I also think he will need arthrocentesis of his left hip to rule out septic arthritis.  Given that patient has diabetes and rheumatoid arthritis and is on immunosuppressant agents, occult infection is possible.  Case discussed with Dr. Doren Custard at [facility], they will accept.  Patient will be transferred.  He is made aware of the plan.    Varney Biles, MD 12/05/21 2351

## 2021-12-06 ENCOUNTER — Inpatient Hospital Stay (HOSPITAL_COMMUNITY): Payer: Medicare Other | Admitting: Certified Registered"

## 2021-12-06 ENCOUNTER — Emergency Department (HOSPITAL_COMMUNITY): Payer: Medicare Other

## 2021-12-06 ENCOUNTER — Ambulatory Visit (HOSPITAL_BASED_OUTPATIENT_CLINIC_OR_DEPARTMENT_OTHER): Payer: Self-pay | Admitting: Orthopaedic Surgery

## 2021-12-06 ENCOUNTER — Inpatient Hospital Stay (HOSPITAL_COMMUNITY): Payer: Medicare Other

## 2021-12-06 ENCOUNTER — Encounter (HOSPITAL_COMMUNITY): Payer: Self-pay | Admitting: Internal Medicine

## 2021-12-06 ENCOUNTER — Encounter (HOSPITAL_COMMUNITY)
Admission: EM | Disposition: A | Discharge status: Home Health | Payer: Self-pay | Source: Home / Self Care | Attending: Family Medicine

## 2021-12-06 ENCOUNTER — Other Ambulatory Visit: Payer: Self-pay

## 2021-12-06 DIAGNOSIS — N189 Chronic kidney disease, unspecified: Secondary | ICD-10-CM | POA: Diagnosis not present

## 2021-12-06 DIAGNOSIS — Z8249 Family history of ischemic heart disease and other diseases of the circulatory system: Secondary | ICD-10-CM | POA: Diagnosis not present

## 2021-12-06 DIAGNOSIS — R600 Localized edema: Secondary | ICD-10-CM | POA: Diagnosis not present

## 2021-12-06 DIAGNOSIS — Z7984 Long term (current) use of oral hypoglycemic drugs: Secondary | ICD-10-CM | POA: Diagnosis not present

## 2021-12-06 DIAGNOSIS — D62 Acute posthemorrhagic anemia: Secondary | ICD-10-CM | POA: Diagnosis not present

## 2021-12-06 DIAGNOSIS — I10 Essential (primary) hypertension: Secondary | ICD-10-CM | POA: Diagnosis not present

## 2021-12-06 DIAGNOSIS — E1122 Type 2 diabetes mellitus with diabetic chronic kidney disease: Secondary | ICD-10-CM

## 2021-12-06 DIAGNOSIS — M25452 Effusion, left hip: Secondary | ICD-10-CM | POA: Diagnosis not present

## 2021-12-06 DIAGNOSIS — N183 Chronic kidney disease, stage 3 unspecified: Secondary | ICD-10-CM

## 2021-12-06 DIAGNOSIS — N182 Chronic kidney disease, stage 2 (mild): Secondary | ICD-10-CM | POA: Diagnosis present

## 2021-12-06 DIAGNOSIS — I129 Hypertensive chronic kidney disease with stage 1 through stage 4 chronic kidney disease, or unspecified chronic kidney disease: Secondary | ICD-10-CM

## 2021-12-06 DIAGNOSIS — F419 Anxiety disorder, unspecified: Secondary | ICD-10-CM | POA: Diagnosis present

## 2021-12-06 DIAGNOSIS — Z91040 Latex allergy status: Secondary | ICD-10-CM | POA: Diagnosis not present

## 2021-12-06 DIAGNOSIS — Z888 Allergy status to other drugs, medicaments and biological substances status: Secondary | ICD-10-CM | POA: Diagnosis not present

## 2021-12-06 DIAGNOSIS — M069 Rheumatoid arthritis, unspecified: Secondary | ICD-10-CM | POA: Diagnosis present

## 2021-12-06 DIAGNOSIS — Z96642 Presence of left artificial hip joint: Secondary | ICD-10-CM | POA: Diagnosis not present

## 2021-12-06 DIAGNOSIS — D631 Anemia in chronic kidney disease: Secondary | ICD-10-CM | POA: Diagnosis not present

## 2021-12-06 DIAGNOSIS — M25559 Pain in unspecified hip: Secondary | ICD-10-CM | POA: Diagnosis present

## 2021-12-06 DIAGNOSIS — Z79899 Other long term (current) drug therapy: Secondary | ICD-10-CM | POA: Diagnosis not present

## 2021-12-06 DIAGNOSIS — M545 Low back pain, unspecified: Secondary | ICD-10-CM | POA: Diagnosis not present

## 2021-12-06 DIAGNOSIS — M25552 Pain in left hip: Secondary | ICD-10-CM | POA: Diagnosis not present

## 2021-12-06 DIAGNOSIS — E119 Type 2 diabetes mellitus without complications: Secondary | ICD-10-CM

## 2021-12-06 DIAGNOSIS — D849 Immunodeficiency, unspecified: Secondary | ICD-10-CM | POA: Diagnosis present

## 2021-12-06 DIAGNOSIS — E785 Hyperlipidemia, unspecified: Secondary | ICD-10-CM | POA: Diagnosis present

## 2021-12-06 DIAGNOSIS — M79605 Pain in left leg: Secondary | ICD-10-CM | POA: Diagnosis not present

## 2021-12-06 DIAGNOSIS — M009 Pyogenic arthritis, unspecified: Secondary | ICD-10-CM

## 2021-12-06 DIAGNOSIS — M1612 Unilateral primary osteoarthritis, left hip: Secondary | ICD-10-CM | POA: Diagnosis present

## 2021-12-06 DIAGNOSIS — N1831 Chronic kidney disease, stage 3a: Secondary | ICD-10-CM

## 2021-12-06 DIAGNOSIS — F32A Depression, unspecified: Secondary | ICD-10-CM | POA: Diagnosis present

## 2021-12-06 DIAGNOSIS — M11252 Other chondrocalcinosis, left hip: Secondary | ICD-10-CM | POA: Diagnosis present

## 2021-12-06 DIAGNOSIS — Z471 Aftercare following joint replacement surgery: Secondary | ICD-10-CM | POA: Diagnosis not present

## 2021-12-06 DIAGNOSIS — M7989 Other specified soft tissue disorders: Secondary | ICD-10-CM | POA: Diagnosis not present

## 2021-12-06 HISTORY — PX: HIP ARTHROPLASTY: SHX981

## 2021-12-06 LAB — URINALYSIS, ROUTINE W REFLEX MICROSCOPIC
Bacteria, UA: NONE SEEN
Bilirubin Urine: NEGATIVE
Glucose, UA: >=500 mg/dL — AB
Hgb urine dipstick: NEGATIVE
Ketones, ur: 20 mg/dL — AB
Leukocytes,Ua: NEGATIVE
Nitrite: NEGATIVE
Protein, ur: 100 mg/dL — AB
Specific Gravity, Urine: 1.025 (ref 1.005–1.030)
pH: 5 (ref 5.0–8.0)

## 2021-12-06 LAB — SURGICAL PCR SCREEN
MRSA, PCR: NEGATIVE
Staphylococcus aureus: NEGATIVE

## 2021-12-06 LAB — GLUCOSE, CAPILLARY
Glucose-Capillary: 127 mg/dL — ABNORMAL HIGH (ref 70–99)
Glucose-Capillary: 144 mg/dL — ABNORMAL HIGH (ref 70–99)
Glucose-Capillary: 156 mg/dL — ABNORMAL HIGH (ref 70–99)

## 2021-12-06 LAB — SYNOVIAL CELL COUNT + DIFF, W/ CRYSTALS
Eosinophils-Synovial: 0 % (ref 0–1)
Lymphocytes-Synovial Fld: 0 % (ref 0–20)
Monocyte-Macrophage-Synovial Fluid: 7 % — ABNORMAL LOW (ref 50–90)
Neutrophil, Synovial: 93 % — ABNORMAL HIGH (ref 0–25)
WBC, Synovial: 66150 /mm3 — ABNORMAL HIGH (ref 0–200)

## 2021-12-06 LAB — C-REACTIVE PROTEIN: CRP: 7.6 mg/dL — ABNORMAL HIGH (ref <1.0)

## 2021-12-06 LAB — SEDIMENTATION RATE: Sed Rate: 50 mm/hr — ABNORMAL HIGH (ref 0–16)

## 2021-12-06 SURGERY — HEMIARTHROPLASTY, HIP, DIRECT ANTERIOR APPROACH, FOR FRACTURE
Anesthesia: General | Site: Hip | Laterality: Left

## 2021-12-06 MED ORDER — LIDOCAINE 2% (20 MG/ML) 5 ML SYRINGE
INTRAMUSCULAR | Status: AC
  Filled 2021-12-06: qty 5

## 2021-12-06 MED ORDER — ACETAMINOPHEN 325 MG PO TABS
650.0000 mg | ORAL_TABLET | Freq: Four times a day (QID) | ORAL | Status: DC | PRN
  Administered 2021-12-08: 650 mg via ORAL
  Filled 2021-12-06: qty 2

## 2021-12-06 MED ORDER — ARTIFICIAL TEARS OPHTHALMIC OINT
TOPICAL_OINTMENT | OPHTHALMIC | Status: AC
  Filled 2021-12-06: qty 3.5

## 2021-12-06 MED ORDER — ROCURONIUM BROMIDE 100 MG/10ML IV SOLN
INTRAVENOUS | Status: DC | PRN
  Administered 2021-12-06: 50 mg via INTRAVENOUS

## 2021-12-06 MED ORDER — HYDROMORPHONE HCL 1 MG/ML IJ SOLN
0.5000 mg | Freq: Once | INTRAMUSCULAR | Status: AC
  Administered 2021-12-06: 0.5 mg via INTRAVENOUS
  Filled 2021-12-06: qty 1

## 2021-12-06 MED ORDER — INSULIN ASPART 100 UNIT/ML IJ SOLN
0.0000 [IU] | Freq: Three times a day (TID) | INTRAMUSCULAR | Status: DC
  Administered 2021-12-07: 8 [IU] via SUBCUTANEOUS
  Administered 2021-12-07: 2 [IU] via SUBCUTANEOUS
  Administered 2021-12-07: 3 [IU] via SUBCUTANEOUS
  Administered 2021-12-08: 2 [IU] via SUBCUTANEOUS
  Administered 2021-12-08: 8 [IU] via SUBCUTANEOUS
  Administered 2021-12-08 – 2021-12-09 (×2): 3 [IU] via SUBCUTANEOUS
  Administered 2021-12-09: 8 [IU] via SUBCUTANEOUS
  Administered 2021-12-09: 3 [IU] via SUBCUTANEOUS
  Administered 2021-12-10: 8 [IU] via SUBCUTANEOUS
  Administered 2021-12-10: 3 [IU] via SUBCUTANEOUS

## 2021-12-06 MED ORDER — PROPOFOL 10 MG/ML IV BOLUS
INTRAVENOUS | Status: DC | PRN
  Administered 2021-12-06: 170 mg via INTRAVENOUS
  Administered 2021-12-06: 100 mg via INTRAVENOUS

## 2021-12-06 MED ORDER — SODIUM CHLORIDE 0.9 % IR SOLN
Status: DC | PRN
  Administered 2021-12-06 (×2): 3000 mL

## 2021-12-06 MED ORDER — FENTANYL CITRATE (PF) 250 MCG/5ML IJ SOLN
INTRAMUSCULAR | Status: DC | PRN
  Administered 2021-12-06: 50 ug via INTRAVENOUS
  Administered 2021-12-06: 100 ug via INTRAVENOUS
  Administered 2021-12-06: 50 ug via INTRAVENOUS

## 2021-12-06 MED ORDER — EPINEPHRINE PF 1 MG/ML IJ SOLN
INTRAMUSCULAR | Status: AC
  Filled 2021-12-06: qty 2

## 2021-12-06 MED ORDER — MORPHINE SULFATE (PF) 2 MG/ML IV SOLN
2.0000 mg | INTRAVENOUS | Status: DC | PRN
  Administered 2021-12-06 – 2021-12-07 (×4): 2 mg via INTRAVENOUS
  Filled 2021-12-06 (×4): qty 1

## 2021-12-06 MED ORDER — ONDANSETRON HCL 4 MG/2ML IJ SOLN
INTRAMUSCULAR | Status: AC
  Filled 2021-12-06: qty 2

## 2021-12-06 MED ORDER — METHOCARBAMOL 1000 MG/10ML IJ SOLN
500.0000 mg | Freq: Once | INTRAVENOUS | Status: AC
  Administered 2021-12-06: 500 mg via INTRAVENOUS
  Filled 2021-12-06: qty 5

## 2021-12-06 MED ORDER — SUGAMMADEX SODIUM 200 MG/2ML IV SOLN
INTRAVENOUS | Status: DC | PRN
  Administered 2021-12-06: 200 mg via INTRAVENOUS

## 2021-12-06 MED ORDER — PROPOFOL 10 MG/ML IV BOLUS
INTRAVENOUS | Status: AC
Start: 2021-12-06 — End: ?
  Filled 2021-12-06: qty 20

## 2021-12-06 MED ORDER — PRAVASTATIN SODIUM 40 MG PO TABS
20.0000 mg | ORAL_TABLET | Freq: Every day | ORAL | Status: DC
  Administered 2021-12-07 – 2021-12-09 (×3): 20 mg via ORAL
  Filled 2021-12-06 (×4): qty 1

## 2021-12-06 MED ORDER — CHLORHEXIDINE GLUCONATE 0.12 % MT SOLN: 15.0000 mL | Freq: Once | OROMUCOSAL | Status: AC

## 2021-12-06 MED ORDER — METHOCARBAMOL 500 MG PO TABS
500.0000 mg | ORAL_TABLET | Freq: Four times a day (QID) | ORAL | Status: DC | PRN
  Administered 2021-12-08 – 2021-12-10 (×3): 500 mg via ORAL
  Filled 2021-12-06 (×3): qty 1

## 2021-12-06 MED ORDER — FENTANYL CITRATE (PF) 250 MCG/5ML IJ SOLN
INTRAMUSCULAR | Status: AC
  Filled 2021-12-06: qty 5

## 2021-12-06 MED ORDER — LIDOCAINE HCL (CARDIAC) PF 100 MG/5ML IV SOSY
PREFILLED_SYRINGE | INTRAVENOUS | Status: DC | PRN
  Administered 2021-12-06: 40 mg via INTRATRACHEAL

## 2021-12-06 MED ORDER — 0.9 % SODIUM CHLORIDE (POUR BTL) OPTIME
TOPICAL | Status: DC | PRN
  Administered 2021-12-06: 1000 mL

## 2021-12-06 MED ORDER — OXYCODONE HCL 5 MG PO TABS
5.0000 mg | ORAL_TABLET | ORAL | Status: DC | PRN
  Administered 2021-12-07 (×4): 10 mg via ORAL
  Administered 2021-12-08: 5 mg via ORAL
  Administered 2021-12-08 – 2021-12-10 (×5): 10 mg via ORAL
  Filled 2021-12-06: qty 1
  Filled 2021-12-06 (×10): qty 2

## 2021-12-06 MED ORDER — CEFAZOLIN SODIUM-DEXTROSE 2-4 GM/100ML-% IV SOLN
2.0000 g | INTRAVENOUS | Status: AC
  Administered 2021-12-06: 2 g via INTRAVENOUS
  Filled 2021-12-06: qty 100

## 2021-12-06 MED ORDER — FENTANYL CITRATE (PF) 100 MCG/2ML IJ SOLN: 25.0000 ug | INTRAMUSCULAR | Status: DC | PRN

## 2021-12-06 MED ORDER — HYDROMORPHONE HCL 1 MG/ML IJ SOLN
INTRAMUSCULAR | Status: AC
  Filled 2021-12-06: qty 1

## 2021-12-06 MED ORDER — PHENYLEPHRINE 80 MCG/ML (10ML) SYRINGE FOR IV PUSH (FOR BLOOD PRESSURE SUPPORT)
PREFILLED_SYRINGE | INTRAVENOUS | Status: AC
  Filled 2021-12-06: qty 10

## 2021-12-06 MED ORDER — DOCUSATE SODIUM 100 MG PO CAPS
100.0000 mg | ORAL_CAPSULE | Freq: Two times a day (BID) | ORAL | Status: DC
  Administered 2021-12-06 – 2021-12-10 (×7): 100 mg via ORAL
  Filled 2021-12-06 (×9): qty 1

## 2021-12-06 MED ORDER — POLYETHYLENE GLYCOL 3350 17 G PO PACK: 17.0000 g | PACK | Freq: Every day | ORAL | Status: DC | PRN

## 2021-12-06 MED ORDER — METHYLPREDNISOLONE SODIUM SUCC 40 MG IJ SOLR
40.0000 mg | Freq: Two times a day (BID) | INTRAMUSCULAR | Status: DC
Start: 2021-12-06 — End: 2021-12-10
  Administered 2021-12-07 – 2021-12-10 (×7): 40 mg via INTRAVENOUS
  Filled 2021-12-06 (×8): qty 1

## 2021-12-06 MED ORDER — ASPIRIN 325 MG PO TABS
325.0000 mg | ORAL_TABLET | Freq: Every day | ORAL | Status: DC
  Administered 2021-12-07 – 2021-12-10 (×4): 325 mg via ORAL
  Filled 2021-12-06 (×4): qty 1

## 2021-12-06 MED ORDER — CHLORHEXIDINE GLUCONATE 4 % EX LIQD
60.0000 mL | Freq: Once | CUTANEOUS | Status: AC
  Administered 2021-12-06: 4 via TOPICAL

## 2021-12-06 MED ORDER — ACETAMINOPHEN 10 MG/ML IV SOLN: 1000.0000 mg | Freq: Once | INTRAVENOUS | Status: DC | PRN

## 2021-12-06 MED ORDER — SODIUM CHLORIDE 0.9 % IV SOLN
2.0000 g | INTRAVENOUS | Status: DC
  Administered 2021-12-07 – 2021-12-08 (×2): 2 g via INTRAVENOUS
  Filled 2021-12-06 (×3): qty 20

## 2021-12-06 MED ORDER — FENTANYL CITRATE (PF) 100 MCG/2ML IJ SOLN
INTRAMUSCULAR | Status: AC
  Administered 2021-12-06: 50 ug
  Filled 2021-12-06: qty 2

## 2021-12-06 MED ORDER — TRANEXAMIC ACID-NACL 1000-0.7 MG/100ML-% IV SOLN
1000.0000 mg | INTRAVENOUS | Status: AC
  Administered 2021-12-06: 1000 mg via INTRAVENOUS
  Filled 2021-12-06: qty 100

## 2021-12-06 MED ORDER — PROPOFOL 10 MG/ML IV BOLUS
INTRAVENOUS | Status: AC
  Filled 2021-12-06: qty 20

## 2021-12-06 MED ORDER — BUPIVACAINE HCL (PF) 0.25 % IJ SOLN
INTRAMUSCULAR | Status: AC
  Filled 2021-12-06: qty 30

## 2021-12-06 MED ORDER — GADOBUTROL 1 MMOL/ML IV SOLN
7.4000 mL | Freq: Once | INTRAVENOUS | Status: AC | PRN
  Administered 2021-12-06: 7.4 mL via INTRAVENOUS

## 2021-12-06 MED ORDER — ORAL CARE MOUTH RINSE: 15.0000 mL | Freq: Once | OROMUCOSAL | Status: AC

## 2021-12-06 MED ORDER — ONDANSETRON HCL 4 MG/2ML IJ SOLN: 4.0000 mg | Freq: Four times a day (QID) | INTRAMUSCULAR | Status: DC | PRN

## 2021-12-06 MED ORDER — CHLORHEXIDINE GLUCONATE 0.12 % MT SOLN
OROMUCOSAL | Status: AC
  Administered 2021-12-06: 15 mL via OROMUCOSAL
  Filled 2021-12-06: qty 15

## 2021-12-06 MED ORDER — MUPIROCIN 2 % EX OINT
1.0000 | TOPICAL_OINTMENT | Freq: Two times a day (BID) | CUTANEOUS | Status: DC
  Administered 2021-12-07 – 2021-12-10 (×7): 1 via NASAL
  Filled 2021-12-06: qty 22

## 2021-12-06 MED ORDER — SUCCINYLCHOLINE CHLORIDE 200 MG/10ML IV SOSY
PREFILLED_SYRINGE | INTRAVENOUS | Status: AC
  Filled 2021-12-06: qty 10

## 2021-12-06 MED ORDER — BUPIVACAINE HCL (PF) 0.25 % IJ SOLN
INTRAMUSCULAR | Status: DC | PRN
  Administered 2021-12-06: 30 mL

## 2021-12-06 MED ORDER — INSULIN ASPART 100 UNIT/ML IJ SOLN
0.0000 [IU] | Freq: Every day | INTRAMUSCULAR | Status: DC
  Administered 2021-12-07: 2 [IU] via SUBCUTANEOUS

## 2021-12-06 MED ORDER — EPHEDRINE 5 MG/ML INJ
INTRAVENOUS | Status: AC
  Filled 2021-12-06: qty 5

## 2021-12-06 MED ORDER — ONDANSETRON HCL 4 MG/2ML IJ SOLN
INTRAMUSCULAR | Status: DC | PRN
  Administered 2021-12-06: 4 mg via INTRAVENOUS

## 2021-12-06 MED ORDER — SODIUM CHLORIDE 0.9 % IV SOLN: INTRAVENOUS | Status: DC

## 2021-12-06 MED ORDER — HYDROMORPHONE HCL 1 MG/ML IJ SOLN
1.0000 mg | Freq: Once | INTRAMUSCULAR | Status: AC
  Administered 2021-12-06: 1 mg via INTRAVENOUS
  Filled 2021-12-06: qty 1

## 2021-12-06 MED ORDER — CARVEDILOL 12.5 MG PO TABS
12.5000 mg | ORAL_TABLET | Freq: Two times a day (BID) | ORAL | Status: DC
Start: 2021-12-06 — End: 2021-12-10
  Administered 2021-12-06 – 2021-12-10 (×8): 12.5 mg via ORAL
  Filled 2021-12-06 (×8): qty 1

## 2021-12-06 MED ORDER — POLYVINYL ALCOHOL 1.4 % OP SOLN: 1.0000 [drp] | OPHTHALMIC | Status: DC | PRN

## 2021-12-06 MED ORDER — LIDOCAINE HCL (PF) 1 % IJ SOLN
5.0000 mL | Freq: Once | INTRAMUSCULAR | Status: AC
  Administered 2021-12-06: 5 mL via INTRADERMAL

## 2021-12-06 MED ORDER — LACTATED RINGERS IV SOLN
INTRAVENOUS | Status: DC
Start: 2021-12-06 — End: 2021-12-06

## 2021-12-06 MED ORDER — BISACODYL 5 MG PO TBEC: 5.0000 mg | DELAYED_RELEASE_TABLET | Freq: Every day | ORAL | Status: DC | PRN

## 2021-12-06 MED ORDER — METHOCARBAMOL 1000 MG/10ML IJ SOLN: 500.0000 mg | Freq: Four times a day (QID) | INTRAVENOUS | Status: DC | PRN

## 2021-12-06 MED ORDER — HYDROMORPHONE HCL 1 MG/ML IJ SOLN
0.2500 mg | INTRAMUSCULAR | Status: DC | PRN
  Administered 2021-12-06 (×4): 0.5 mg via INTRAVENOUS

## 2021-12-06 MED ORDER — HYDROXYZINE HCL 25 MG PO TABS
25.0000 mg | ORAL_TABLET | Freq: Every day | ORAL | Status: DC | PRN
Start: 2021-12-06 — End: 2021-12-10

## 2021-12-06 MED ORDER — HYDRALAZINE HCL 25 MG PO TABS
25.0000 mg | ORAL_TABLET | Freq: Two times a day (BID) | ORAL | Status: DC
  Administered 2021-12-06 – 2021-12-10 (×8): 25 mg via ORAL
  Filled 2021-12-06 (×9): qty 1

## 2021-12-06 MED ORDER — EPINEPHRINE PF 1 MG/ML IJ SOLN
INTRAMUSCULAR | Status: DC | PRN
  Administered 2021-12-06 (×2): 1 mg

## 2021-12-06 MED ORDER — LACTATED RINGERS IV SOLN: INTRAVENOUS | Status: DC | PRN

## 2021-12-06 MED ORDER — ROCURONIUM BROMIDE 10 MG/ML (PF) SYRINGE
PREFILLED_SYRINGE | INTRAVENOUS | Status: AC
  Filled 2021-12-06: qty 10

## 2021-12-06 MED ORDER — CLONAZEPAM 1 MG PO TABS: 1.0000 mg | ORAL_TABLET | ORAL | Status: DC | PRN

## 2021-12-06 MED ORDER — AMLODIPINE BESYLATE 10 MG PO TABS
10.0000 mg | ORAL_TABLET | Freq: Every day | ORAL | Status: DC
  Administered 2021-12-06 – 2021-12-10 (×5): 10 mg via ORAL
  Filled 2021-12-06 (×5): qty 1

## 2021-12-06 MED ORDER — ACETAMINOPHEN 10 MG/ML IV SOLN
INTRAVENOUS | Status: AC
  Administered 2021-12-06: 1000 mg
  Filled 2021-12-06: qty 100

## 2021-12-06 MED ORDER — MIDAZOLAM HCL 2 MG/2ML IJ SOLN
INTRAMUSCULAR | Status: AC
  Filled 2021-12-06: qty 2

## 2021-12-06 SURGICAL SUPPLY — 31 items
BAG COUNTER SPONGE SURGICOUNT (BAG) ×2 IMPLANT
BLADE SURG 11 STRL SS (BLADE) ×2 IMPLANT
CANNULA ARTHR TRIM-IT 8.25 (CANNULA) ×1 IMPLANT
CHLORAPREP W/TINT 26 (MISCELLANEOUS) ×2 IMPLANT
COVER SURGICAL LIGHT HANDLE (MISCELLANEOUS) ×2 IMPLANT
DRAPE C-ARM 42X72 X-RAY (DRAPES) ×2 IMPLANT
DRAPE STERI IOBAN 125X83 (DRAPES) ×2 IMPLANT
DRAPE U-SHAPE 47X51 STRL (DRAPES) ×3 IMPLANT
DRESSING MEPILEX FLEX 4X4 (GAUZE/BANDAGES/DRESSINGS) IMPLANT
DRSG MEPILEX FLEX 4X4 (GAUZE/BANDAGES/DRESSINGS) ×4
DW OUTFLOW CASSETTE/TUBE SET (MISCELLANEOUS) ×1 IMPLANT
GLOVE BIOGEL PI IND STRL 8 (GLOVE) ×2 IMPLANT
GLOVE BIOGEL PI INDICATOR 8 (GLOVE) ×1
GLOVE SURG SYN 7.5  E (GLOVE) ×1
GLOVE SURG SYN 7.5 E (GLOVE) ×1 IMPLANT
GLOVE SURG SYN 7.5 PF PI (GLOVE) ×3 IMPLANT
GOWN STRL REUS W/ TWL XL LVL3 (GOWN DISPOSABLE) ×1 IMPLANT
GOWN STRL REUS W/TWL XL LVL3 (GOWN DISPOSABLE) ×2
KIT BASIN OR (CUSTOM PROCEDURE TRAY) ×2 IMPLANT
KIT HIP ARTHROSCOPY (ORTHOPEDIC DISPOSABLE SUPPLIES) ×1 IMPLANT
KIT TURNOVER KIT B (KITS) ×2 IMPLANT
MANIFOLD NEPTUNE II (INSTRUMENTS) ×2 IMPLANT
NS IRRIG 1000ML POUR BTL (IV SOLUTION) ×2 IMPLANT
PACK SURGICAL SETUP 50X90 (CUSTOM PROCEDURE TRAY) ×2 IMPLANT
PAD ARMBOARD 7.5X6 YLW CONV (MISCELLANEOUS) ×4 IMPLANT
SPIKE FLUID TRANSFER (MISCELLANEOUS) ×2 IMPLANT
SPONGE T-LAP 18X18 ~~LOC~~+RFID (SPONGE) ×2 IMPLANT
SUT ETHILON 3 0 PS 1 (SUTURE) ×1 IMPLANT
TOWEL GREEN STERILE (TOWEL DISPOSABLE) ×2 IMPLANT
TUBE CONNECTING 12X1/4 (SUCTIONS) ×3 IMPLANT
TUBING ARTHROSCOPY IRRIG 16FT (MISCELLANEOUS) ×2 IMPLANT

## 2021-12-06 NOTE — Anesthesia Procedure Notes (Signed)
Procedure Name: Intubation Date/Time: 12/06/2021 8:25 PM  Performed by: Clovis Cao, CRNAPre-anesthesia Checklist: Patient identified, Emergency Drugs available, Suction available and Patient being monitored Patient Re-evaluated:Patient Re-evaluated prior to induction Oxygen Delivery Method: Circle system utilized Preoxygenation: Pre-oxygenation with 100% oxygen Induction Type: IV induction Ventilation: Mask ventilation without difficulty LMA: LMA inserted LMA Size: 4.0 Number of attempts: 1 Placement Confirmation: positive ETCO2 and breath sounds checked- equal and bilateral Tube secured with: Tape Dental Injury: Teeth and Oropharynx as per pre-operative assessment

## 2021-12-06 NOTE — Anesthesia Procedure Notes (Signed)
Procedure Name: Intubation Date/Time: 12/06/2021 8:26 PM  Performed by: Clovis Cao, CRNAPre-anesthesia Checklist: Patient identified, Emergency Drugs available, Suction available and Patient being monitored Patient Re-evaluated:Patient Re-evaluated prior to induction Oxygen Delivery Method: Circle system utilized Preoxygenation: Pre-oxygenation with 100% oxygen Ventilation: Mask ventilation without difficulty Laryngoscope Size: Miller and 2 Grade View: Grade I Tube type: Oral Tube size: 7.0 mm Number of attempts: 1 Airway Equipment and Method: Stylet Placement Confirmation: ETT inserted through vocal cords under direct vision, positive ETCO2 and breath sounds checked- equal and bilateral Secured at: 23 cm Tube secured with: Tape Dental Injury: Teeth and Oropharynx as per pre-operative assessment

## 2021-12-06 NOTE — Anesthesia Postprocedure Evaluation (Signed)
Anesthesia Post Note  Patient: Kevin Lopez  Procedure(s) Performed: LEFT HIP ARTHROSCOPY IRRIGATION AND DEBRIDEMENT. (Left: Hip)     Patient location during evaluation: PACU Anesthesia Type: General Level of consciousness: awake and alert Pain management: pain level controlled Vital Signs Assessment: post-procedure vital signs reviewed and stable Respiratory status: spontaneous breathing, nonlabored ventilation, respiratory function stable and patient connected to nasal cannula oxygen Cardiovascular status: blood pressure returned to baseline and stable Postop Assessment: no apparent nausea or vomiting Anesthetic complications: no   No notable events documented.  Last Vitals:  Vitals:   12/06/21 2147 12/06/21 2200  BP:  (!) 152/81  Pulse: 73 72  Resp: 17 11  Temp:    SpO2: 97% 93%    Last Pain:  Vitals:   12/06/21 2145  TempSrc:   PainSc: 7                  [name] P Gable Odonohue

## 2021-12-06 NOTE — Anesthesia Preprocedure Evaluation (Addendum)
Anesthesia Evaluation  Patient identified by MRN, date of birth, ID band Patient awake    Reviewed: Allergy & Precautions, NPO status , Patient's Chart, lab work & pertinent test results  Airway Mallampati: II  TM Distance: >3 FB Neck ROM: Full    Dental no notable dental hx.    Pulmonary neg pulmonary ROS,    Pulmonary exam normal        Cardiovascular hypertension, Pt. on medications and Pt. on home beta blockers  Rhythm:Regular Rate:Normal     Neuro/Psych negative neurological ROS  negative psych ROS   GI/Hepatic negative GI ROS, Neg liver ROS,   Endo/Other  diabetes  Renal/GU CRFRenal disease  negative genitourinary   Musculoskeletal  (+) Arthritis , Rheumatoid disorders,    Abdominal Normal abdominal exam  (+)   Peds  Hematology  (+) Blood dyscrasia, anemia ,   Anesthesia Other Findings   Reproductive/Obstetrics                            Anesthesia Physical Anesthesia Plan  ASA: 3  Anesthesia Plan: General   Post-op Pain Management:    Induction: Intravenous  PONV Risk Score and Plan: 2 and Ondansetron and Dexamethasone  Airway Management Planned: Mask and LMA  Additional Equipment: None  Intra-op Plan:   Post-operative Plan: Extubation in OR  Informed Consent: I have reviewed the patients History and Physical, chart, labs and discussed the procedure including the risks, benefits and alternatives for the proposed anesthesia with the patient or authorized representative who has indicated his/her understanding and acceptance.     Dental advisory given  Plan Discussed with: CRNA  Anesthesia Plan Comments:         Anesthesia Quick Evaluation

## 2021-12-06 NOTE — Brief Op Note (Signed)
   Brief Op Note  Date of Surgery: 12/06/2021  Preoperative Diagnosis: LEFT HIP PAIN  Postoperative Diagnosis: same  Procedure: Procedure(s): LEFT HIP ARTHROSCOPY IRRIGATION AND DEBRIDEMENT.  Implants: * No implants in log *  Surgeons: Surgeon(s): Vanetta Mulders, MD  Anesthesia: General    Estimated Blood Loss: See anesthesia record  Complications: None  Condition to PACU: Stable  Yevonne Pax, MD 12/06/2021 8:48 PM

## 2021-12-06 NOTE — Transfer of Care (Signed)
Immediate Anesthesia Transfer of Care Note  Patient: Kevin Lopez  Procedure(s) Performed: LEFT HIP ARTHROSCOPY IRRIGATION AND DEBRIDEMENT. (Left: Hip)  Patient Location: PACU  Anesthesia Type:General  Level of Consciousness: drowsy  Airway & Oxygen Therapy: Patient Spontanous Breathing and Patient connected to face mask oxygen  Post-op Assessment: Report given to RN and Post -op Vital signs reviewed and stable  Post vital signs: Reviewed and stable  Last Vitals:  Vitals Value Taken Time  BP 157/106 12/06/21 2105  Temp    Pulse 73 12/06/21 2107  Resp 14 12/06/21 2107  SpO2 100 % 12/06/21 2107  Vitals shown include unvalidated device data.  Last Pain:  Vitals:   12/06/21 1853  TempSrc: Oral  PainSc: 9          Complications: No notable events documented.

## 2021-12-06 NOTE — Progress Notes (Signed)
Update:  Dr. Sammuel Hines came to evaluate the patient and has decided to take the patient to the OR for a washout.  Plan otherwise as prior.  Continue antibiotics and steroids for now.  If cultures are negative, antibiotics can be stopped.   Carlyon Shadow, M.D.

## 2021-12-06 NOTE — ED Triage Notes (Signed)
Pt here via [facility] from [facility] for MRI. Pt co L leg pain from lower back into grown and down into foot, CT and xray unremarkable however labs showed WBC 20. 22g RAC, '1mg'$  dilaudid given in route but pain is still 10/10. Pt unable to stand or ambulate due to pain. Pt denies injury, sensation intact. 156/80, 79HR, 18RR, 98% RA

## 2021-12-06 NOTE — H&P (Signed)
History and Physical    Patient: Kevin Lopez LNL:[id number] DOB: [birth year] DOA: 12/05/2021 DOS: the patient was seen and examined on 12/06/2021 PCP: [name], MD  Patient coming from: Home - lives alone; NOK: [place], [place], [phone]   Chief Complaint: hip pain  HPI: Kevin Lopez is a 71 y.o. male with medical history significant of DM; HTN; HLD; RA; SBO; and CKD presenting with L hip pain with groin radiation.  Tuesday, he woke up with mild groin pain.  He took Tylenol and used heat/cold.   It got worse that night and it was killing him Wednesday.  He wrapped it, trying compression.  The pain got worse.  He called his PCP and rheumatologist and was told to go to the ER to r/o DVT.  He went to [facility] yesterday and had xrays, concerned for pinched nerve but WBC was high.  He was given pain meds without improvement.  MRI here and they are suspicious for septic arthritis.  He can't even bear weight.  Pain from the groin down all the way up to the other side.  No fever.  No injury or precursor illness.      ER Course:  [facility] transfer due to concern for septic hip.  Has RA, immunosuppressed.  2 days of bad L hip pain, can't walk.  Needs arthrocentesis, IR to tap.  Pain control for now.     Review of Systems: As mentioned in the history of present illness. All other systems reviewed and are negative. Past Medical History:  Diagnosis Date   Chronic kidney disease    Diabetes mellitus without complication (HCC)    Diverticulosis    Hyperlipidemia    Hypertension    RA (rheumatoid arthritis) ([place])    SBO (small bowel obstruction) s/p ex lap in past 12/15/2015   Past Surgical History:  Procedure Laterality Date   APPENDECTOMY     COLOSTOMY TAKEDOWN     [place]   FLEXIBLE SIGMOIDOSCOPY N/A 12/15/2015   Procedure: FLEXIBLE SIGMOIDOSCOPY;  Surgeon: Danie Binder, MD;  Location: [facility];  Service: Endoscopy;  Laterality: N/A;   KNEE SURGERY Right     LEFT COLECTOMY     [place]   LYSIS OF ADHESION     SBO   Social History:  reports that he has never smoked. He has never used smokeless tobacco. He reports current alcohol use. He reports that he does not use drugs.  Allergies  Allergen Reactions   Latex Shortness Of Breath   Irbesartan Hives    swelling    Metformin Diarrhea, Nausea And Vomiting and Swelling    Family History  Problem Relation Age of Onset   Cancer Mother    Heart attack Father    Hypertension Sister    Hypertension Brother     Prior to Admission medications   Medication Sig Start Date End Date Taking? Authorizing Provider  Abatacept (ORENCIA) 125 MG/ML SOSY Inject into the skin every 30 (thirty) days.    [provider]  albuterol (PROVENTIL HFA;VENTOLIN HFA) 108 (90 Base) MCG/ACT inhaler Inhale 2 puffs into the lungs every 6 (six) hours as needed for wheezing or shortness of breath.    [provider]  amLODipine (NORVASC) 10 MG tablet Take 10 mg by mouth daily.    [provider]  Ascorbic Acid (VITAMIN C) 1000 MG tablet Take 1,000 mg by mouth daily.    [provider]  carvedilol (COREG) 12.5 MG tablet Take 12.5 mg  by mouth 2 (two) times daily with a meal.    [provider]  clonazePAM (KLONOPIN) 1 MG tablet Take 1 mg by mouth as needed for anxiety.    [provider]  Coenzyme Q10 (CO Q10) 100 MG CAPS Take 100 mg by mouth as needed.    [provider]  dapagliflozin propanediol (FARXIGA) 5 MG TABS tablet Take 5 mg by mouth daily.    [provider]  ergocalciferol (VITAMIN D2) 1.25 MG (50000 UT) capsule ergocalciferol (vitamin D2) 1,250 mcg (50,000 unit) capsule    [provider]  fluticasone (FLONASE) 50 MCG/ACT nasal spray 1 spray in each nostril 06/21/21   [provider]  hydrALAZINE (APRESOLINE) 25 MG tablet Take 25 mg by mouth in the morning and at bedtime.    [provider]  hydrOXYzine (ATARAX/VISTARIL) 25  MG tablet Take 25 mg by mouth daily as needed.    [provider]  ketoconazole (NIZORAL) 2 % cream Apply 1 application topically daily as needed for irritation.  11/22/15   [provider]  linagliptin (TRADJENTA) 5 MG TABS tablet 1 tablet    [provider]  lovastatin (MEVACOR) 20 MG tablet Take 20 mg by mouth daily at 12 noon. 05/04/19   [provider]  Multiple Vitamin (MULTIVITAMIN WITH MINERALS) TABS tablet Take 1 tablet by mouth daily.    [provider]  tamsulosin (FLOMAX) 0.4 MG CAPS capsule  08/17/19   [provider]    Physical Exam: Vitals:   12/06/21 1015 12/06/21 1245 12/06/21 1248 12/06/21 1548  BP: (!) 149/92 (!) 150/71  (!) 166/74  Pulse: 73 69  64  Resp: 20 18  18   Temp:   98 F (36.7 C) 98.4 F (36.9 C)  TempSrc:    Oral  SpO2: 100% 100%  100%  Weight:      Height:       General:  Appears calm and comfortable and is in NAD; repeatedly asking to eat Eyes:  EOMI, normal lids, iris ENT:  grossly normal hearing, lips & tongue, mildly dry mm Neck:  no LAD, masses or thyromegaly Cardiovascular:  RRR, no m/r/g. No LE edema.  Respiratory:   CTA bilaterally with no wheezes/rales/rhonchi.  Normal respiratory effort. Abdomen:  soft, NT, ND Skin:  no rash or induration seen on limited exam Musculoskeletal:  marked L groin TTP with mild-moderate TTP along L anterior thigh; very limited ROM due to pain Lower extremity:  No LE edema.  Limited foot exam with no ulcerations.  2+ distal pulses. Psychiatric:  blunted mood and affect, speech fluent and appropriate other than repeatedly asking about food/drink despite multiple explanations, AOx3 Neurologic:  CN 2-12 grossly intact, moves all extremities in coordinated fashion other than LLE   Radiological Exams on Admission: Independently reviewed - see discussion in A/P where applicable  DG FLUORO GUIDED NEEDLE PLC ASPIRATION/INJECTION LOC  Result Date: 12/06/2021 CLINICAL  DATA:  Left hip joint effusion, concern for infection. EXAM: LEFT HIP ARTHROCENTESIS UNDER FLUOROSCOPY COMPARISON:  MRI of the left hip 12/06/2021. FLUOROSCOPY: Fluoroscopy time: 15 seconds (0.90 mGy). PROCEDURE: Clovia Cuff, PA-C obtained informed consent from the patient prior to the procedure. This process included a discussion of procedural risks. An appropriate skin entry site was determined under fluoroscopy and marked. A time-out was performed. The operator donned sterile gloves and a mask. The skin entry site was prepped with Betadine, draped in the usual sterile fashion and infiltrated locally with buffered lidocaine. Under fluoroscopic  guidance, a 20-gauge needle was advanced into the joint space at the superolateral aspect of the femoral head/neck junction. 12 mL of synovial fluid was aspirated from the joint space. The needle was removed and a dressing was applied to the skin entry site. The patient tolerated the procedure well, and no immediate post-procedure complication was apparent. The examination was performed by Clovia Cuff, PA-C, and with supervised and interpreted by Kellie Simmering, D.O. IMPRESSION: Technically successful fluoroscopically-guided left hip arthrocentesis. 12 mL of synovial fluid aspirated and sent to the laboratory for analysis. No immediate post-procedure complication. Electronically Signed   By: Kellie Simmering D.O.   On: 12/06/2021 14:22   MR HIP LEFT W WO CONTRAST  Result Date: 12/06/2021 CLINICAL DATA:  Severe left leg pain. No injury. History of rheumatoid arthritis. EXAM: MRI OF THE LEFT HIP WITHOUT AND WITH CONTRAST TECHNIQUE: Multiplanar, multisequence MR imaging was performed both before and after administration of intravenous contrast. CONTRAST:  7.10m GADAVIST GADOBUTROL 1 MMOL/ML IV SOLN COMPARISON:  Left femur x-rays from yesterday. CT abdomen pelvis dated December 16, 2015. FINDINGS: Bones: There is no evidence of acute fracture, dislocation or avascular necrosis. No  erosions. No focal bone lesion. The visualized sacroiliac joints and symphysis pubis appear normal. Articular cartilage and labrum Articular cartilage: No focal chondral defect or subchondral signal abnormality identified. Labrum: Small tear of the left anterior superior labrum. No paralabral abnormality. Joint or bursal effusion Joint effusion: Small left hip joint effusion with synovial enhancement. Bursae: Trace fluid in both greater trochanteric bursae. Muscles and tendons Muscles and tendons: Periarticular muscle edema and soft tissue swelling about the left hip. The visualized gluteus, hamstring and iliopsoas tendons appear normal. No muscle atrophy. Other findings Miscellaneous: Trace free fluid in the pelvis. The visualized internal pelvic contents appear unremarkable. IMPRESSION: 1. Small left hip joint effusion with synovitis and periarticular inflammatory changes. No erosions. Differential considerations include inflammatory arthropathy and septic arthritis. If there is concern for infection, recommend arthrocentesis. 2. No osteomyelitis or abscess. Electronically Signed   By: WTitus DubinM.D.   On: 12/06/2021 08:10   MR Lumbar Spine W Wo Contrast  Result Date: 12/06/2021 CLINICAL DATA:  Low back pain with spondyloarthropathy suspected EXAM: MRI LUMBAR SPINE WITHOUT AND WITH CONTRAST TECHNIQUE: Multiplanar and multiecho pulse sequences of the lumbar spine were obtained without and with intravenous contrast. CONTRAST:  7.46mGADAVIST GADOBUTROL 1 MMOL/ML IV SOLN COMPARISON:  06/13/2017 report, images currently not available FINDINGS: Segmentation:  5 lumbar type vertebrae Alignment:  Mild dextroscoliosis Vertebrae: Benign heterogeneity of marrow. No fracture, discitis, or aggressive bone lesion Conus medullaris and cauda equina: Conus extends to the L1 level. Conus and cauda equina appear normal. Paraspinal and other soft tissues: Negative for perispinal mass or inflammation Disc levels: T12- L1:  Unremarkable. L1-L2: Disc narrowing and bulging.  Posterior annular fissure. L2-L3: Right foraminal and extraforaminal herniation without nerve root compression. L3-L4: Disc collapse with endplate ridging and endplate degeneration eccentric to the left where there is also greater facet spurring. Left foraminal impingement with spur and disc compressing the L3 nerve root on sagittal images. Moderate spinal stenosis L4-L5: Disc narrowing and bulging with right inferior foraminal protrusion. Accentuated right facet spurring. Right foraminal impingement. L5-S1:Disc narrowing and mild bulging. IMPRESSION: 1. No acute finding. No significant change when compared to MRI report from 2019. 2. Degenerative foraminal impingement on the left at L3-4 and right at L4-5. 3. L3-4 moderate spinal stenosis. Electronically Signed   By: JoGilford Silvius.  On: 12/06/2021 05:57   DG Femur Min 2 Views Left  Result Date: 12/05/2021 CLINICAL DATA:  Pain EXAM: LEFT FEMUR 2 VIEWS COMPARISON:  None Available. FINDINGS: No fracture or dislocation is seen. Degenerative changes with small bony spurs are seen in left knee. Minimal bony spurs are seen in the medial aspect of the left hip joint. There are no focal lytic lesions. IMPRESSION: No fracture or dislocation is seen. Degenerative changes are noted in left knee. Minimal bony spurs are seen in left hip. Electronically Signed   By: Elmer Picker M.D.   On: 12/05/2021 19:52    EKG: not done   Labs on Admission: I have personally reviewed the available labs and imaging studies at the time of the admission.  Pertinent labs:    Glucose 135 BUN 32/Creatinine 1.70/GFR 43 - stable CK 113 CRP 7.6 WBC 20.4 Hgb 9.6 ESR 50 UA: >500 glucose, 20 ketones, 100 protein Hip aspirate - WBC 66,150, 93% neutrophils, calcium pyrophosphate crystals   Assessment and Plan: Principal Problem:   Hip pain Active Problems:   Hypertension   RA (rheumatoid arthritis) (HCC)   CKD  (chronic kidney disease) stage 3, GFR 30-59 ml/min (HCC)   Diabetes mellitus type 2 in nonobese (HCC)   Dyslipidemia    Septic arthritis of L hip -Patient presenting with acute onset of L hip/groin pain Tuesday, progressively worsening -Evaluation and imaging suggestive of septic arthritis of the hip after IR performed joint aspiration, could also be pseudogout -Synovial fluid cultures are pending -Will order GC/Chl -CRP mildly elevated.  WBC is often <15 with septic arthritis but is markedly elevated. -Ortho recommends just following cultures for now without plan for washout -Can give antibiotics to be safe, per ortho -Will also give steroids, per ortho -Will admit to Med Surg. -Empiric coverage with Rocephin/Solumedrol for now -Dr. Sammuel Hines will consult -He is immunocompromised so is at increased risk for infection  DM -Will check A1c -hold Farxiga, [place] with moderate-scale SSI   HTN -Continue amlodipine, carvedilol  HLD -Continue lovastatin  RA -Hold Orencia for now  Stage 3a/b CKD -right on the a/b line -Appears to be stable -Mild ketonuria so maybe creatinine will improve with gentle hydration  Anxiety -Continue Klonopin, hydroxyzine    Advance Care Planning:   Code Status: Full Code   Consults: IR; orthopedics; nutrition; TOC team  DVT Prophylaxis: SCDs  Family Communication: None present; he is capable of communicating with family at this time  Severity of Illness: The appropriate patient status for this patient is INPATIENT. Inpatient status is judged to be reasonable and necessary in order to provide the required intensity of service to ensure the patient's safety. The patient's presenting symptoms, physical exam findings, and initial radiographic and laboratory data in the context of their chronic comorbidities is felt to place them at high risk for further clinical deterioration. Furthermore, it is not anticipated that the patient will be  medically stable for discharge from the hospital within 2 midnights of admission.   * I certify that at the point of admission it is my clinical judgment that the patient will require inpatient hospital care spanning beyond 2 midnights from the point of admission due to high intensity of service, high risk for further deterioration and high frequency of surveillance required.*  Author: Karmen Bongo, MD 12/06/2021 4:57 PM  For on call review [email or web address].

## 2021-12-06 NOTE — ED Provider Notes (Signed)
MRI L hip shows inflammation/infection   IMPRESSION:  1. Small left hip joint effusion with synovitis and periarticular  inflammatory changes. No erosions. Differential considerations  include inflammatory arthropathy and septic arthritis. If there is  concern for infection, recommend arthrocentesis.  2. No osteomyelitis or abscess.      Electronically Signed    MRI lumbar spine IMPRESSION:  1. No acute finding. No significant change when compared to MRI  report from 2019.  2. Degenerative foraminal impingement on the left at L3-4 and right  at L4-5.  3. L3-4 moderate spinal stenosis.     Discussed with [facility] - will take for arthrocentesis at 1pm  Discussed with Dr. Lorin Mercy who will admit.    Pati Gallo [place], [place] 12/06/21 1221    Wyvonnia Dusky, MD 12/06/21 1255

## 2021-12-06 NOTE — Op Note (Addendum)
Date of Surgery: 12/06/2021  INDICATIONS: Kevin Lopez is a 71 y.o.-year-old male with left hip pain and effusion as well as a hip aspiration which was concerning for pseudogout superinfection.  I explained the risks and benefits of arthroscopic intervention for irrigation debridement of the ultimately elected for this.  The risk and benefits of the procedure were discussed in detail and documented in the pre-operative evaluation.   PREOPERATIVE DIAGNOSIS: 1.  Left hip suspected pyogenic arthritis in the setting of calcium pyrophosphate superinfection  POSTOPERATIVE DIAGNOSIS: Same.  PROCEDURE: 1.  Left hip arthroscopy with irrigation and debridement  SURGEON: Yevonne Pax MD  ASSISTANT: Toney Rakes, Scrub Technician  ANESTHESIA:  general  IV FLUIDS AND URINE: See anesthesia record.  ANTIBIOTICS: Ancef  ESTIMATED BLOOD LOSS: 5 mL.  IMPLANTS:  * No implants in log *  DRAINS: None  CULTURES: None  COMPLICATIONS: none  DESCRIPTION OF PROCEDURE:  Cartilage High grade cartilage lesion: no The remainder of the femoral and acetabular cartilage was normal.   Labrum There was a somewhat degenerative appearing labral without frank tearing  There was overall appearing murky type fluid upon placement of the initial spinal needle.  The patient was brought to the operating room, placed supine on the operating table, and bony prominences were padded.  The traction boots were applied with padding to ensure that safe traction could be applied through the feet.  The contralateral limb was abducted maximally and light traction was applied.  The operative leg was brought into neutral position.  The flouroscopic c-arm was brought between the legs for an AP image.  The patient was prepped and draped in a sterile fashion.  Time-out was performed and landmarks were identified. Traction was obtained and care was taken to ensure the least amount of force necessary to allow safe access to the  joint of 8-86m.  This was checked with fluoroscopy.    Next we placed an anterolateral portal under the assistance of fluoroscopy.  First, fluoroscopy was used to estimate the trajectory and starting point.  A 544mincision with a #11 blade was made and a straight hemostat was used to dilate the portal through the appropriate tract.  We then placed a 14-gauge hypodermic needle with careful technique to be as close to the femoral head as possible and parallel to the sorcele to ensure no iatrogenic damage to the labrum.  This released the negative pressure environment and the amount of traction was adjusted to maintain the 8-1065mf distraction.  A nitinol wire was placed through the needle and flouroscopy was used to ensure it extended to the medial wall of the acetabulum.  An arthroscopic cannula was placed over the wire and the nitinol wire was retracted to just inside the capsule during insertion of the dilator and cannula to minimize the risk of breakage. The arthroscope was placed next and we visualized the anterior triangle.    We then placed the anterior portal under direct visualization using the technique described above.  This was safely placed as well without damage to the labrum or femoral head.  A total of 6 L of normal saline was then irrigated through the wound.  Following this all of the contents of the hip were clear.  Traction was let down with a good suction seal of the hip   We then removed the arthroscope and closed the incisions with 4-0 nylon simple stitches.  A sterile dressing was applied..  The patient was awakened from anesthesia and transferred  to PACU in stable condition.   POSTOPERATIVE PLAN: He will be weightbearing and activity as tolerated on the left leg.  I will plan to follow the cultures from his hip aspirate.  If these remain negative we will plan to discontinue antibiotics.  He will be treated for pseudogout as well.  He will perform physical therapy while in the  hospital  Yevonne Pax, MD 8:48 PM

## 2021-12-06 NOTE — H&P (Signed)
ORTHOPAEDIC CONSULTATION  REQUESTING PHYSICIAN: Karmen Bongo, MD  Chief Complaint: Left hip pain  HPI: Kevin Lopez is a 71 y.o. male who presents with presents with left hip pain that has been ongoing for 2 days and progressively worsening.  Of note he does have a history of rheumatoid arthritis for which he takes an IV immunosuppressive medication.  He states that he does not have any known history of gout or pseudogout and he does see rheumatology regularly for his rheumatoid arthritis.  He states that he has never had joint pain similar to this.The left hip he is experiencing the majority of his pain in the groin and there is some radiation along the medial aspect of the thigh.  He is not able to bear weight on the hip whatsoever.  He initially presented to [facility] where he was found to have an elevated white count of 20 and transferred to [facility] for IR guided aspiration.  Results of this showed 66,000 white blood cells with greater than 90% differential of neutrophils.  He overall is very active.  He does still enjoy playing volleyball.  He has borderline diabetes.  He is not taking any insulin.  He has several daughters 1 of which is in medical school at the [employer].  Past Medical History:  Diagnosis Date   Chronic kidney disease    Diabetes mellitus without complication (HCC)    Diverticulosis    Hyperlipidemia    Hypertension    RA (rheumatoid arthritis) ([place])    SBO (small bowel obstruction) s/p ex lap in past 12/15/2015   Past Surgical History:  Procedure Laterality Date   APPENDECTOMY     COLOSTOMY TAKEDOWN     [place]   FLEXIBLE SIGMOIDOSCOPY N/A 12/15/2015   Procedure: FLEXIBLE SIGMOIDOSCOPY;  Surgeon: Danie Binder, MD;  Location: [facility];  Service: Endoscopy;  Laterality: N/A;   KNEE SURGERY Right    LEFT COLECTOMY     [place]   LYSIS OF ADHESION     SBO   Social History   Socioeconomic History   Marital status: Divorced     Spouse name: Not on file   Number of children: 3   Years of education: Not on file   Highest education level: Not on file  Occupational History   Occupation: retired  Tobacco Use   Smoking status: Never   Smokeless tobacco: Never  Vaping Use   Vaping Use: Never used  Substance and Sexual Activity   Alcohol use: Yes    Comment: occ.    Drug use: No   Sexual activity: Not on file  Other Topics Concern   Not on file  Social History Narrative   Not on file   Social Determinants of Health   Financial Resource Strain: High Risk (04/18/2020)   Overall Financial Resource Strain (CARDIA)    Difficulty of Paying Living Expenses: Very hard  Food Insecurity: Food Insecurity Present (04/18/2020)   Hunger Vital Sign    Worried About Running Out of Food in the Last Year: Sometimes true    Ran Out of Food in the Last Year: Sometimes true  Transportation Needs: No Transportation Needs (04/18/2020)   PRAPARE - Hydrologist (Medical): No    Lack of Transportation (Non-Medical): No  Physical Activity: Insufficiently Active (04/18/2020)   Exercise Vital Sign    Days of Exercise per Week: 2 days    Minutes of Exercise per Session: 20  min  Stress: Stress Concern Present (04/18/2020)   [place]    Feeling of Stress : Rather much  Social Connections: Moderately Isolated (04/18/2020)   Social Connection and Isolation Panel [NHANES]    Frequency of Communication with Friends and Family: More than three times a week    Frequency of Social Gatherings with Friends and Family: Once a week    Attends Religious Services: More than 4 times per year    Active Member of [employer] or Organizations: No    Attends Music therapist: Never    Marital Status: Divorced   Family History  Problem Relation Age of Onset   Cancer Mother    Heart attack Father    Hypertension Sister    Hypertension  Brother    - negative except otherwise stated in the family history section Allergies  Allergen Reactions   Latex Shortness Of Breath   Irbesartan Hives    swelling    Metformin Diarrhea, Nausea And Vomiting and Swelling   Prior to Admission medications   Medication Sig Start Date End Date Taking? Authorizing Provider  Abatacept (ORENCIA) 125 MG/ML SOSY Inject 125 mg into the skin every 30 (thirty) days.   Yes [provider]  acetaminophen (TYLENOL) 500 MG tablet Take 500 mg by mouth every 6 (six) hours as needed for moderate pain.   Yes [provider]  amLODipine (NORVASC) 10 MG tablet Take 10 mg by mouth daily.   Yes [provider]  Ascorbic Acid (VITAMIN C) 1000 MG tablet Take 1,000 mg by mouth daily.   Yes [provider]  carvedilol (COREG) 12.5 MG tablet Take 12.5 mg by mouth 2 (two) times daily with a meal.   Yes [provider]  clonazePAM (KLONOPIN) 1 MG tablet Take 1 mg by mouth as needed for anxiety.   Yes [provider]  Cyanocobalamin (B-12 PO) Take 1 capsule by mouth daily.   Yes [provider]  dapagliflozin propanediol (FARXIGA) 5 MG TABS tablet Take 5 mg by mouth daily.   Yes [provider]  ergocalciferol (VITAMIN D2) 1.25 MG (50000 UT) capsule Take 50,000 Units by mouth once a week.   Yes [provider]  fluticasone (FLONASE) 50 MCG/ACT nasal spray Place 1 spray into both nostrils daily as needed for allergies. 06/21/21  Yes [provider]  hydrALAZINE (APRESOLINE) 25 MG tablet Take 25 mg by mouth in the morning and at bedtime.   Yes [provider]  hydrOXYzine (ATARAX/VISTARIL) 25 MG tablet Take 25 mg by mouth daily as needed for anxiety.   Yes [provider]  linagliptin (TRADJENTA) 5 MG TABS tablet Take 5 mg by mouth daily.   Yes [provider]  lovastatin (MEVACOR) 20 MG tablet Take 20 mg by mouth daily at 12 noon. 05/04/19  Yes [provider]  Multiple Vitamin (MULTIVITAMIN WITH MINERALS) TABS tablet Take 1 tablet by mouth daily.   Yes [provider]   DG FLUORO GUIDED NEEDLE PLC ASPIRATION/INJECTION LOC  Result Date: 12/06/2021 CLINICAL DATA:  Left hip joint effusion, concern for infection. EXAM: LEFT HIP ARTHROCENTESIS UNDER FLUOROSCOPY COMPARISON:  MRI of the left hip 12/06/2021. FLUOROSCOPY: Fluoroscopy time: 15 seconds (0.90 mGy). PROCEDURE: Clovia Cuff, PA-C obtained informed consent from the patient prior to the procedure. This process included a discussion of procedural risks. An appropriate skin entry site was determined under fluoroscopy and marked. A time-out was performed. The operator  donned sterile gloves and a mask. The skin entry site was prepped with Betadine, draped in the usual sterile fashion and infiltrated locally with buffered lidocaine. Under fluoroscopic guidance, a 20-gauge needle was advanced into the joint space at the superolateral aspect of the femoral head/neck junction. 12 mL of synovial fluid was aspirated from the joint space. The needle was removed and a dressing was applied to the skin entry site. The patient tolerated the procedure well, and no immediate post-procedure complication was apparent. The examination was performed by Clovia Cuff, PA-C, and with supervised and interpreted by Kellie Simmering, D.O. IMPRESSION: Technically successful fluoroscopically-guided left hip arthrocentesis. 12 mL of synovial fluid aspirated and sent to the laboratory for analysis. No immediate post-procedure complication. Electronically Signed   By: Kellie Simmering D.O.   On: 12/06/2021 14:22   MR HIP LEFT W WO CONTRAST  Result Date: 12/06/2021 CLINICAL DATA:  Severe left leg pain. No injury. History of rheumatoid arthritis. EXAM: MRI OF THE LEFT HIP WITHOUT AND WITH CONTRAST TECHNIQUE: Multiplanar, multisequence MR imaging was performed both before and after administration of intravenous contrast.  CONTRAST:  7.77m GADAVIST GADOBUTROL 1 MMOL/ML IV SOLN COMPARISON:  Left femur x-rays from yesterday. CT abdomen pelvis dated December 16, 2015. FINDINGS: Bones: There is no evidence of acute fracture, dislocation or avascular necrosis. No erosions. No focal bone lesion. The visualized sacroiliac joints and symphysis pubis appear normal. Articular cartilage and labrum Articular cartilage: No focal chondral defect or subchondral signal abnormality identified. Labrum: Small tear of the left anterior superior labrum. No paralabral abnormality. Joint or bursal effusion Joint effusion: Small left hip joint effusion with synovial enhancement. Bursae: Trace fluid in both greater trochanteric bursae. Muscles and tendons Muscles and tendons: Periarticular muscle edema and soft tissue swelling about the left hip. The visualized gluteus, hamstring and iliopsoas tendons appear normal. No muscle atrophy. Other findings Miscellaneous: Trace free fluid in the pelvis. The visualized internal pelvic contents appear unremarkable. IMPRESSION: 1. Small left hip joint effusion with synovitis and periarticular inflammatory changes. No erosions. Differential considerations include inflammatory arthropathy and septic arthritis. If there is concern for infection, recommend arthrocentesis. 2. No osteomyelitis or abscess. Electronically Signed   By: WTitus DubinM.D.   On: 12/06/2021 08:10   MR Lumbar Spine W Wo Contrast  Result Date: 12/06/2021 CLINICAL DATA:  Low back pain with spondyloarthropathy suspected EXAM: MRI LUMBAR SPINE WITHOUT AND WITH CONTRAST TECHNIQUE: Multiplanar and multiecho pulse sequences of the lumbar spine were obtained without and with intravenous contrast. CONTRAST:  7.457mGADAVIST GADOBUTROL 1 MMOL/ML IV SOLN COMPARISON:  06/13/2017 report, images currently not available FINDINGS: Segmentation:  5 lumbar type vertebrae Alignment:  Mild dextroscoliosis Vertebrae: Benign heterogeneity of marrow. No fracture,  discitis, or aggressive bone lesion Conus medullaris and cauda equina: Conus extends to the L1 level. Conus and cauda equina appear normal. Paraspinal and other soft tissues: Negative for perispinal mass or inflammation Disc levels: T12- L1: Unremarkable. L1-L2: Disc narrowing and bulging.  Posterior annular fissure. L2-L3: Right foraminal and extraforaminal herniation without nerve root compression. L3-L4: Disc collapse with endplate ridging and endplate degeneration eccentric to the left where there is also greater facet spurring. Left foraminal impingement with spur and disc compressing the L3 nerve root on sagittal images. Moderate spinal stenosis L4-L5: Disc narrowing and bulging with right inferior foraminal protrusion. Accentuated right facet spurring. Right foraminal impingement. L5-S1:Disc narrowing and mild bulging. IMPRESSION: 1. No acute finding. No significant change when compared to MRI report from 2019.  2. Degenerative foraminal impingement on the left at L3-4 and right at L4-5. 3. L3-4 moderate spinal stenosis. Electronically Signed   By: Jorje Guild M.D.   On: 12/06/2021 05:57   DG Femur Min 2 Views Left  Result Date: 12/05/2021 CLINICAL DATA:  Pain EXAM: LEFT FEMUR 2 VIEWS COMPARISON:  None Available. FINDINGS: No fracture or dislocation is seen. Degenerative changes with small bony spurs are seen in left knee. Minimal bony spurs are seen in the medial aspect of the left hip joint. There are no focal lytic lesions. IMPRESSION: No fracture or dislocation is seen. Degenerative changes are noted in left knee. Minimal bony spurs are seen in left hip. Electronically Signed   By: Elmer Picker M.D.   On: 12/05/2021 19:52     Positive ROS: All other systems have been reviewed and were otherwise negative with the exception of those mentioned in the HPI and as above.  Physical Exam: General: No acute distress Cardiovascular: No pedal edema Respiratory: No cyanosis, no use of accessory  musculature GI: No organomegaly, abdomen is soft and non-tender Skin: No lesions in the area of chief complaint Neurologic: Sensation intact distally Psychiatric: Patient is at baseline mood and affect Lymphatic: No axillary or cervical lymphadenopathy  MUSCULOSKELETAL:  Tenderness about the left groin.  He has exquisite tenderness with 10 degrees internal and external rotation logroll of the hip.  There is pain and radiation in the obturator distribution medially.  He is able to move the left knee painlessly.  Distal neurosensory exam is intact.  Independent Imaging Review: X-ray [facility] pelvis, MRI left hip: There is a left hip effusion that is localized to the femoral acetabular joint.  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  On my examination he is exquisitely tender with even 10 degrees of internal and external rotation.  He does also have a white count of 20 with 66,000 nucleated cells in his hip aspiration and greater than 90% of them are neutrophils.  Given the fact that he is immunosuppressed I did have a long conversation with him about treatment.  We discussed that we could potentially wait for the cultures to come back although given all of his findings and the fact that he is currently immunocompromise I do believe infection to be quite high on the differential.  That effect I have discussed with him the possibility of arthroscopic irrigation and debridement.  After discussion of this he has elected to proceed with this.  We did discuss this findings and the specific with hip arthroscopy and the downside of a "negative" surgery although there are a number of findings that speak towards this being a superinfection to that effect he would like to aggressively treat it.  Plan: Plan for left hip arthroscopy with irrigation and debridement   After a lengthy discussion of treatment  options, including risks, benefits, alternatives, complications of surgical and nonsurgical conservative options, the patient elected surgical repair.   The patient  is aware of the material risks  and complications including, but not limited to injury to adjacent structures, neurovascular injury, infection, numbness, bleeding, implant failure, thermal burns, stiffness, persistent pain, failure to heal, disease transmission from allograft, need for further surgery, dislocation, anesthetic risks, blood clots, risks of death,and others. The probabilities of surgical success and failure discussed with patient given their particular co-morbidities.The time and nature of expected rehabilitation and recovery was discussed.The patient's questions  were all answered preoperatively.  No barriers to understanding were noted. I explained the natural history of the disease process and Rx rationale.  I explained to the patient what I considered to be reasonable expectations given their personal situation.  The final treatment plan was arrived at through a shared patient decision making process model.   Thank you for the consult and the opportunity to see Mr. Milagros Reap, MD [facility] 6:03 PM

## 2021-12-06 NOTE — Discharge Instructions (Signed)
     Discharge Instructions    Attending Surgeon: Vanetta Mulders, MD Office Phone Number: [phone]   Diagnosis and Procedures:    Surgeries Performed: Left hip irrigation and debridement  Discharge Plan:   GENERAL INSTRUCTIONS: 1.  Please apply ice to your wound to help with swelling and inflammation. This will improve your comfort and your overall recovery following surgery.     2. Please call Dr. Eddie Dibbles office at [phone] with questions Monday-Friday during business hours. If no one answers, please leave a message and someone should get back to the patient within 24 hours. For emergencies please call 911 or proceed to the emergency room.   3. Patient to notify surgical team if experiences any of the following: Bowel/Bladder dysfunction, uncontrolled pain, nerve/muscle weakness, incision with increased drainage or redness, nausea/vomiting and Fever greater than 101.0 F.  Be alert for signs of infection including redness, streaking, odor, fever or chills. Be alert for excessive pain or bleeding and notify your surgeon immediately.  WOUND INSTRUCTIONS:   Leave your dressing, cast, or splint in place until your post operative visit.  Keep it clean and dry.  Always keep the incision clean and dry until the staples/sutures are removed. If there is no drainage from the incision you should keep it open to air. If there is drainage from the incision you must keep it covered at all times until the drainage stops  Do not soak in a bath tub, hot tub, pool, lake or other body of water until 21 days after your surgery and your incision is completely dry and healed.  If you have removable sutures (or staples) they must be removed 10-14 days (unless otherwise instructed) from the day of your surgery.     1)  Elevate the extremity as much as possible.  2)  Keep the dressing clean and dry.  3)  Please call us if the dressing becomes wet or dirty.  4)  If you are experiencing worsening  pain or worsening swelling, please call.

## 2021-12-06 NOTE — Interval H&P Note (Signed)
History and Physical Interval Note:  12/06/2021 6:42 PM  Kevin Lopez  has presented today for surgery, with the diagnosis of LEFT HIP PAIN.  The various methods of treatment have been discussed with the patient and family. After consideration of risks, benefits and other options for treatment, the patient has consented to  Procedure(s): LEFT HIP ARTHROSCOPY IRRIGATION AND DEBRIDEMENT. (Left) as a surgical intervention.  The patient's history has been reviewed, patient examined, no change in status, stable for surgery.  I have reviewed the patient's chart and labs.  Questions were answered to the patient's satisfaction.     Vanetta Mulders

## 2021-12-06 NOTE — ED Notes (Signed)
CALLED PATIENT X3 NO ANSWER

## 2021-12-07 ENCOUNTER — Encounter (HOSPITAL_COMMUNITY): Payer: Self-pay | Admitting: Orthopaedic Surgery

## 2021-12-07 DIAGNOSIS — M25552 Pain in left hip: Secondary | ICD-10-CM | POA: Diagnosis not present

## 2021-12-07 LAB — BASIC METABOLIC PANEL
Anion gap: 10 (ref 5–15)
BUN: 26 mg/dL — ABNORMAL HIGH (ref 8–23)
CO2: 22 mmol/L (ref 22–32)
Calcium: 8.6 mg/dL — ABNORMAL LOW (ref 8.9–10.3)
Chloride: 105 mmol/L (ref 98–111)
Creatinine, Ser: 1.41 mg/dL — ABNORMAL HIGH (ref 0.61–1.24)
GFR, Estimated: 54 mL/min — ABNORMAL LOW (ref >60)
Glucose, Bld: 128 mg/dL — ABNORMAL HIGH (ref 70–99)
Potassium: 4.3 mmol/L (ref 3.5–5.1)
Sodium: 137 mmol/L (ref 135–145)

## 2021-12-07 LAB — HIV ANTIBODY (ROUTINE TESTING W REFLEX): HIV Screen 4th Generation wRfx: NONREACTIVE

## 2021-12-07 LAB — GLUCOSE, CAPILLARY
Glucose-Capillary: 126 mg/dL — ABNORMAL HIGH (ref 70–99)
Glucose-Capillary: 254 mg/dL — ABNORMAL HIGH (ref 70–99)
Glucose-Capillary: 169 mg/dL — ABNORMAL HIGH (ref 70–99)
Glucose-Capillary: 221 mg/dL — ABNORMAL HIGH (ref 70–99)

## 2021-12-07 LAB — CBC
HCT: 28.6 % — ABNORMAL LOW (ref 39.0–52.0)
Hemoglobin: 9.3 g/dL — ABNORMAL LOW (ref 13.0–17.0)
MCH: 25.7 pg — ABNORMAL LOW (ref 26.0–34.0)
MCHC: 32.5 g/dL (ref 30.0–36.0)
MCV: 79 fL — ABNORMAL LOW (ref 80.0–100.0)
Platelets: 404 10*3/uL — ABNORMAL HIGH (ref 150–400)
RBC: 3.62 MIL/uL — ABNORMAL LOW (ref 4.22–5.81)
RDW: 17.4 % — ABNORMAL HIGH (ref 11.5–15.5)
WBC: 20.9 10*3/uL — ABNORMAL HIGH (ref 4.0–10.5)
nRBC: 0 % (ref 0.0–0.2)

## 2021-12-07 LAB — HEMOGLOBIN A1C
Hgb A1c MFr Bld: 5.8 % — ABNORMAL HIGH (ref 4.8–5.6)
Mean Plasma Glucose: 119.76 mg/dL

## 2021-12-07 LAB — GLUCOSE, BODY FLUID OTHER: Glucose, Body Fluid Other: 66 mg/dL

## 2021-12-07 LAB — PROTEIN, BODY FLUID (OTHER): Total Protein, Body Fluid Other: 3.4 g/dL

## 2021-12-07 MED ORDER — HYDROMORPHONE HCL 1 MG/ML IJ SOLN
1.0000 mg | Freq: Once | INTRAMUSCULAR | Status: AC
  Administered 2021-12-07: 1 mg via INTRAVENOUS
  Filled 2021-12-07: qty 1

## 2021-12-07 MED ORDER — COLCHICINE 0.6 MG PO TABS
0.6000 mg | ORAL_TABLET | Freq: Two times a day (BID) | ORAL | Status: DC
  Administered 2021-12-07 – 2021-12-10 (×7): 0.6 mg via ORAL
  Filled 2021-12-07 (×8): qty 1

## 2021-12-07 MED ORDER — FENTANYL CITRATE PF 50 MCG/ML IJ SOSY
12.5000 ug | PREFILLED_SYRINGE | Freq: Once | INTRAMUSCULAR | Status: AC
  Administered 2021-12-07: 12.5 ug via INTRAVENOUS
  Filled 2021-12-07: qty 1

## 2021-12-07 MED ORDER — DEXTROSE 50 % IV SOLN
INTRAVENOUS | Status: AC
  Filled 2021-12-07: qty 50

## 2021-12-07 MED ORDER — ADULT MULTIVITAMIN W/MINERALS CH
1.0000 | ORAL_TABLET | Freq: Every day | ORAL | Status: DC
  Administered 2021-12-08 – 2021-12-10 (×3): 1 via ORAL
  Filled 2021-12-07 (×3): qty 1

## 2021-12-07 MED ORDER — HYDROMORPHONE HCL 1 MG/ML IJ SOLN
2.0000 mg | INTRAMUSCULAR | Status: DC | PRN
  Administered 2021-12-07 – 2021-12-09 (×7): 2 mg via INTRAVENOUS
  Filled 2021-12-07 (×7): qty 2

## 2021-12-07 MED ORDER — ENSURE ENLIVE PO LIQD
237.0000 mL | Freq: Two times a day (BID) | ORAL | Status: DC
  Administered 2021-12-08 – 2021-12-10 (×5): 237 mL via ORAL

## 2021-12-07 NOTE — Progress Notes (Signed)
PROGRESS NOTE   Kevin Lopez  NAT:[id number] DOB: [birth year] DOA: 12/05/2021 PCP: [name], MD  Brief Narrative:  71 year old black male known HTN, rheumatoid arthritis follows with rheumatology Dr.Tauseef Dossie Der, CKD 2 Prior abdominal surgeries resulting with SBO's and colonic ileus in the past--also had a Presented to [facility] 7/19 had x-rays n episode of ischemic colitis back in 2017 Presented to ER after 3 days of mild right groin pain with compression  WBC was high-transferred to [facility] for concerns of septic On MRI  Hospital-Problem based course  Septic arthritis vs Pseudogout Await cultures--fluid Does show Calcium Pyrophosphate additionally-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 raises risk for this being Infectious  superimposed upon crystallarthropathy Would continue at this time Ceftriaxone for Possible infection If no growth would stop Pain is uncontrolled--adding Dilaudid 2 mg q2 prn for severe pain Is on Solumedrol 40 q12--would use cautiously Add Colchicine for pain management and continue oxy ir for moderate pain RH Arthritis on monthly Orencia as OP Would continue management as OP with rheumatologist 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 this admit As OP on linagliptin 5 mg, farxiga 5 mg daily Continue SSI--do not expect to be well controlled on steroids---monitor tredns CKD II Continue saline 75 cc/h--no specific AKI at this time Htn Continue Amlodipine 10, Coreg 12.5 bid, Hydralazine 25 bid Anxiety/depression Cont Atarax 25 qd prn, Clonopin 1 mg qd  DVT prophylaxis: scd Code Status: Full Family Communication: none present Disposition:  Status is: Inpatient Remains inpatient appropriate because:   Needs further work-up   Consultants:  [facility]  Procedures:   Antimicrobials: as above    Subjective: In severe pain--received IV pain meds No fever no chills Can hardly raise leg up however  Objective: Vitals:   12/06/21 2230 12/06/21 2315  12/07/21 0435 12/07/21 0725  BP: (!) 150/73 (!) 150/76 (!) 147/64 (!) 154/71  Pulse: 72 72 68 70  Resp: '10 12 15   '$ Temp:  98.5 F (36.9 C) 97.9 F (36.6 C) 98.8 F (37.1 C)  TempSrc:  Oral Oral Oral  SpO2: 99% 97% 100% 99%  Weight:      Height:        Intake/Output Summary (Last 24 hours) at 12/07/2021 0746 Last data filed at 12/06/2021 2051 Gross per 24 hour  Intake 1200 ml  Output 220 ml  Net 980 ml   Filed Weights   12/05/21 1826  Weight: 73.9 kg    Examination:  Eomi ncat no focal deficit Cta b no rales rhonchi wheeze Abd soft nt nd no rebound no gaurd S1 s 2no m/r/g Rom grossly intact  Data Reviewed: personally reviewed   CBC    Component Value Date/Time   WBC 20.4 (H) 12/05/2021 1955   RBC 3.71 (L) 12/05/2021 1955   HGB 9.6 (L) 12/05/2021 1955   HGB 10.2 (L) 07/31/2021 1426   HCT 30.2 (L) 12/05/2021 1955   HCT 31.8 (L) 07/31/2021 1426   PLT 400 12/05/2021 1955   MCV 81.4 12/05/2021 1955   MCH 25.9 (L) 12/05/2021 1955   MCHC 31.8 12/05/2021 1955   RDW 17.5 (H) 12/05/2021 1955   LYMPHSABS 4.0 04/18/2020 1331   MONOABS 0.7 04/18/2020 1331   EOSABS 0.3 04/18/2020 1331   BASOSABS 0.1 04/18/2020 1331      Latest Ref Rng & Units 12/05/2021    7:55 PM 08/06/2021   11:57 AM 04/18/2020    1:31 PM  CMP  Glucose 70 - 99 mg/dL 135  186  133  BUN 8 - 23 mg/dL 32  29  28   Creatinine 0.61 - 1.24 mg/dL 1.70  1.70  1.67   Sodium 135 - 145 mmol/L 137  140  139   Potassium 3.5 - 5.1 mmol/L 4.1  5.3  5.0   Chloride 98 - 111 mmol/L 107  103  105   CO2 22 - 32 mmol/L '21  19  25   '$ Calcium 8.9 - 10.3 mg/dL 9.2  9.9  9.7   Total Protein 6.5 - 8.1 g/dL   7.7   Total Bilirubin 0.3 - 1.2 mg/dL   0.6   Alkaline Phos 38 - 126 U/L   51   AST 15 - 41 U/L   19   ALT 0 - 44 U/L   26      Radiology Studies: DG HIP UNILAT WITH PELVIS 1V LEFT  Result Date: 12/06/2021 CLINICAL DATA:  Left hip arthroscopy, irrigation and debridement. EXAM: DG HIP (WITH OR WITHOUT PELVIS)  1V*L* COMPARISON:  12/05/2021. FINDINGS: Three fluoroscopic images were obtained intraoperatively. Total fluoroscopy time is 45 seconds. Dose: 6.04 mGy. Images demonstrate surgical instruments at the left hip. Please see operative report for additional information. IMPRESSION: Intraoperative utilization of fluoroscopy. Electronically Signed   By: Brett Fairy M.D.   On: 12/06/2021 20:54   DG C-Arm 1-60 Min-No Report  Result Date: 12/06/2021 Fluoroscopy was utilized by the requesting physician.  No radiographic interpretation.   DG FLUORO GUIDED NEEDLE PLC ASPIRATION/INJECTION LOC  Result Date: 12/06/2021 CLINICAL DATA:  Left hip joint effusion, concern for infection. EXAM: LEFT HIP ARTHROCENTESIS UNDER FLUOROSCOPY COMPARISON:  MRI of the left hip 12/06/2021. FLUOROSCOPY: Fluoroscopy time: 15 seconds (0.90 mGy). PROCEDURE: Clovia Cuff, PA-C obtained informed consent from the patient prior to the procedure. This process included a discussion of procedural risks. An appropriate skin entry site was determined under fluoroscopy and marked. A time-out was performed. The operator donned sterile gloves and a mask. The skin entry site was prepped with Betadine, draped in the usual sterile fashion and infiltrated locally with buffered lidocaine. Under fluoroscopic guidance, a 20-gauge needle was advanced into the joint space at the superolateral aspect of the femoral head/neck junction. 12 mL of synovial fluid was aspirated from the joint space. The needle was removed and a dressing was applied to the skin entry site. The patient tolerated the procedure well, and no immediate post-procedure complication was apparent. The examination was performed by Clovia Cuff, PA-C, and with supervised and interpreted by Kellie Simmering, D.O. IMPRESSION: Technically successful fluoroscopically-guided left hip arthrocentesis. 12 mL of synovial fluid aspirated and sent to the laboratory for analysis. No immediate post-procedure  complication. Electronically Signed   By: Kellie Simmering D.O.   On: 12/06/2021 14:22   MR HIP LEFT W WO CONTRAST  Result Date: 12/06/2021 CLINICAL DATA:  Severe left leg pain. No injury. History of rheumatoid arthritis. EXAM: MRI OF THE LEFT HIP WITHOUT AND WITH CONTRAST TECHNIQUE: Multiplanar, multisequence MR imaging was performed both before and after administration of intravenous contrast. CONTRAST:  7.9m GADAVIST GADOBUTROL 1 MMOL/ML IV SOLN COMPARISON:  Left femur x-rays from yesterday. CT abdomen pelvis dated December 16, 2015. FINDINGS: Bones: There is no evidence of acute fracture, dislocation or avascular necrosis. No erosions. No focal bone lesion. The visualized sacroiliac joints and symphysis pubis appear normal. Articular cartilage and labrum Articular cartilage: No focal chondral defect or subchondral signal abnormality identified. Labrum: Small tear of the left anterior superior labrum. No paralabral abnormality. Joint or  bursal effusion Joint effusion: Small left hip joint effusion with synovial enhancement. Bursae: Trace fluid in both greater trochanteric bursae. Muscles and tendons Muscles and tendons: Periarticular muscle edema and soft tissue swelling about the left hip. The visualized gluteus, hamstring and iliopsoas tendons appear normal. No muscle atrophy. Other findings Miscellaneous: Trace free fluid in the pelvis. The visualized internal pelvic contents appear unremarkable. IMPRESSION: 1. Small left hip joint effusion with synovitis and periarticular inflammatory changes. No erosions. Differential considerations include inflammatory arthropathy and septic arthritis. If there is concern for infection, recommend arthrocentesis. 2. No osteomyelitis or abscess. Electronically Signed   By: Titus Dubin M.D.   On: 12/06/2021 08:10   MR Lumbar Spine W Wo Contrast  Result Date: 12/06/2021 CLINICAL DATA:  Low back pain with spondyloarthropathy suspected EXAM: MRI LUMBAR SPINE WITHOUT AND WITH  CONTRAST TECHNIQUE: Multiplanar and multiecho pulse sequences of the lumbar spine were obtained without and with intravenous contrast. CONTRAST:  7.71m GADAVIST GADOBUTROL 1 MMOL/ML IV SOLN COMPARISON:  06/13/2017 report, images currently not available FINDINGS: Segmentation:  5 lumbar type vertebrae Alignment:  Mild dextroscoliosis Vertebrae: Benign heterogeneity of marrow. No fracture, discitis, or aggressive bone lesion Conus medullaris and cauda equina: Conus extends to the L1 level. Conus and cauda equina appear normal. Paraspinal and other soft tissues: Negative for perispinal mass or inflammation Disc levels: T12- L1: Unremarkable. L1-L2: Disc narrowing and bulging.  Posterior annular fissure. L2-L3: Right foraminal and extraforaminal herniation without nerve root compression. L3-L4: Disc collapse with endplate ridging and endplate degeneration eccentric to the left where there is also greater facet spurring. Left foraminal impingement with spur and disc compressing the L3 nerve root on sagittal images. Moderate spinal stenosis L4-L5: Disc narrowing and bulging with right inferior foraminal protrusion. Accentuated right facet spurring. Right foraminal impingement. L5-S1:Disc narrowing and mild bulging. IMPRESSION: 1. No acute finding. No significant change when compared to MRI report from 2019. 2. Degenerative foraminal impingement on the left at L3-4 and right at L4-5. 3. L3-4 moderate spinal stenosis. Electronically Signed   By: JJorje GuildM.D.   On: 12/06/2021 05:57   DG Femur Min 2 Views Left  Result Date: 12/05/2021 CLINICAL DATA:  Pain EXAM: LEFT FEMUR 2 VIEWS COMPARISON:  None Available. FINDINGS: No fracture or dislocation is seen. Degenerative changes with small bony spurs are seen in left knee. Minimal bony spurs are seen in the medial aspect of the left hip joint. There are no focal lytic lesions. IMPRESSION: No fracture or dislocation is seen. Degenerative changes are noted in left knee.  Minimal bony spurs are seen in left hip. Electronically Signed   By: PElmer PickerM.D.   On: 12/05/2021 19:52     Scheduled Meds:  amLODipine  10 mg Oral Daily   aspirin  325 mg Oral Daily   carvedilol  12.5 mg Oral BID WC   docusate sodium  100 mg Oral BID   hydrALAZINE  25 mg Oral BID   HYDROmorphone       HYDROmorphone       insulin aspart  0-15 Units Subcutaneous TID WC   insulin aspart  0-5 Units Subcutaneous QHS   methylPREDNISolone (SOLU-MEDROL) injection  40 mg Intravenous Q12H   mupirocin ointment  1 Application Nasal BID   pravastatin  20 mg Oral q1800   Continuous Infusions:  sodium chloride 75 mL/hr at 12/06/21 2237   cefTRIAXone (ROCEPHIN)  IV     methocarbamol (ROBAXIN) IV       LOS:  1 day   Time spent: [place], MD [facility] To contact the attending provider between 7A-7P or the covering provider during after hours 7P-7A, please log into the web site [email or web address] and access using universal [place] password for that web site. If you do not have the password, please call the hospital operator.  12/07/2021, 7:46 AM

## 2021-12-07 NOTE — Progress Notes (Signed)
Initial Nutrition Assessment  DOCUMENTATION CODES:   Not applicable  INTERVENTION:   -Ensure Enlive po BID, each supplement provides 350 kcal and 20 grams of protein -MVI with minerals daily -Hot tea and prune juice with meals per pt request  NUTRITION DIAGNOSIS:   Increased nutrient needs related to post-op healing as evidenced by estimated needs.  GOAL:   Patient will meet greater than or equal to 90% of their needs  MONITOR:   PO intake, Supplement acceptance  REASON FOR ASSESSMENT:   Consult Assessment of nutrition requirement/status, Hip fracture protocol  ASSESSMENT:   Pt with medical history significant of DM; HTN; HLD; RA; SBO; and CKD presenting with L hip pain with groin radiation  Pt admitted with septic arthritis of lt hip.   7/20- s/p Left hip arthroscopy with irrigation and debridement  Reviewed I/O's: +980 ml x 24 hours  UOP: 200 ml x 24 hours  Spoke with pt over the phone, who complains of slight pain. He shares that he completed 100% of his breakfast this morning (oatmeal and eggs). PTA pt had a good appetite, consuming 3 meals per day (Breakfast: [place] bacon, eggs, oatmeal/ raisin bran; Lunch: sandwich; Dinner: chicken/ fish, sweet potato, and broccoli).   Reviewed wt hx; pt has experienced a 1.9% wt loss over the past 4 months, which is not significant for time frame. Per pt, his UBW is around 172# and thinks he weight is now around 155%. He estimates a 10-15# wt loss over the past month, but is unsure why he has lost weight. He expresses concern about weight loss, stating "it's starting to get noticeable".   Discussed importance of good meal and supplement intake to promote post-operative healing. Pt amenable to supplements. He is also requesting hot tea and prune juice with meals to help with BMs. Pt had no further questions or needs at this time, but expressed gratitude for visit.   Medications reviewed and include colace, solu-medrol, and 0.9%  sodium chloride infusion @ 75 ml/hr.   Lab Results  Component Value Date   HGBA1C 5.8 (H) 12/07/2021   PTA DM medications are 5 mg dapagliflozin daily.   Labs reviewed: CBGS: 126-254 (inpatient orders for glycemic control are 0-15 units insulin aspart TID with meals and 0-5 units insulin aspart daily at bedtime).    Diet Order:   Diet Order             Diet regular Room service appropriate? Yes; Fluid consistency: Thin  Diet effective now                   EDUCATION NEEDS:   Education needs have been addressed  Skin:  Skin Assessment: Skin Integrity Issues: Skin Integrity Issues:: Incisions Incisions: closed lt hip  Last BM:  12/05/21  Height:   Ht Readings from Last 1 Encounters:  12/05/21 '5\' 9"'$  (1.753 m)    Weight:   Wt Readings from Last 1 Encounters:  12/05/21 73.9 kg    Ideal Body Weight:  72.7 kg  BMI:  Body mass index is 24.07 kg/m.  Estimated Nutritional Needs:   Kcal:  2000-2200  Protein:  105-120 grams  Fluid:  > 2 L    Loistine Chance, RD, LDN, [place] Registered Dietitian II Certified Diabetes Care and Education Specialist Please refer to [facility] for RD and/or RD on-call/weekend/after hours pager

## 2021-12-07 NOTE — Plan of Care (Signed)
  Problem: Clinical Measurements: Goal: Diagnostic test results will improve Outcome: Progressing   

## 2021-12-07 NOTE — Progress Notes (Signed)
   Subjective:  Patient reports pain as severe this morning.  He is tolerating diet.  Pending physical therapy.  Voiding.  Denies any numbness in the lower extremity   objective:   VITALS:   Vitals:   12/06/21 2230 12/06/21 2315 12/07/21 0435 12/07/21 0725  BP: (!) 150/73 (!) 150/76 (!) 147/64 (!) 154/71  Pulse: 72 72 68 70  Resp: '10 12 15   '$ Temp:  98.5 F (36.9 C) 97.9 F (36.6 C) 98.8 F (37.1 C)  TempSrc:  Oral Oral Oral  SpO2: 99% 97% 100% 99%  Weight:      Height:       Left hip dressings are clean dry intact.  Able to fire tibialis anterior as well as EHL and gastrocsoleus.  Sensation is intact in all distributions lower extremity.  2+ dorsalis pedis pulse.  He does have some tenderness with bilateral although this is improved compared to preop.  Improved  Lab Results  Component Value Date   WBC 20.4 (H) 12/05/2021   HGB 9.6 (L) 12/05/2021   HCT 30.2 (L) 12/05/2021   MCV 81.4 12/05/2021   PLT 400 12/05/2021     Assessment/Plan:  1 Day Post-Op   - Expected postop acute blood loss anemia - will monitor for symptoms - Patient to work with PT/OT to optimize mobilization safely - DVT ppx - SCDs, ambulation, aspirin 325 - Agree with antibiotics pending negative cultures for 2 days and steroids to treat pseudogout - WBAT operative extremity - Pain control - multimodal pain management, ATC acetaminophen in conjunction with as needed narcotic (oxycodone), although this should be minimized with other modalities   Katelynn Heidler 12/07/2021, 8:26 AM

## 2021-12-07 NOTE — Evaluation (Signed)
Physical Therapy Evaluation Patient Details Name: Kevin Lopez MRN: [id number] DOB: [birth year] Today's Date: 12/07/2021  History of Present Illness  Kevin Lopez is a 71 y.o. male admitted 7/19  presenting with L hip pain with groin radiation. Pt with septic hip and underwent left hip arthroscopy with I&D 7/20.  PMH:  DM; HTN; HLD; RA; SBO and CKD  Clinical Impression  Pt admitted with above diagnosis. Pt was able to ambulate with RW and was performing NWB left LE as he would not put foot on the floor.  Pt was steady with RW and states he will have assist at home. If so should be safe to go home with HHPT and equipment as below. Will follow acutely.  Pt currently with functional limitations due to the deficits listed below (see PT Problem List). Pt will benefit from skilled PT to increase their independence and safety with mobility to allow discharge to the venue listed below.          Recommendations for follow up therapy are one component of a multi-disciplinary discharge planning process, led by the attending physician.  Recommendations may be updated based on patient status, additional functional criteria and insurance authorization.  Follow Up Recommendations Home health PT (states daughter will be there 24 hours and needs this initially.)      Assistance Recommended at Discharge Intermittent Supervision/Assistance  Patient can return home with the following  A little help with walking and/or transfers;A little help with bathing/dressing/bathroom;Assistance with cooking/housework;Assist for transportation;Help with stairs or ramp for entrance    Equipment Recommendations Rolling walker (2 wheels);BSC/3in1  Recommendations for Other Services       Functional Status Assessment Patient has had a recent decline in their functional status and demonstrates the ability to make significant improvements in function in a reasonable and predictable amount of time.     Precautions /  Restrictions Precautions Precautions: Fall Restrictions LLE Weight Bearing: Weight bearing as tolerated      Mobility  Bed Mobility Overal bed mobility: Needs Assistance Bed Mobility: Supine to Sit     Supine to sit: Mod assist     General bed mobility comments: Mod assist to move LEs off bed and to move pt with pad as he was very painful with movement    Transfers Overall transfer level: Needs assistance Equipment used: Rolling walker (2 wheels) Transfers: Sit to/from Stand, Bed to chair/wheelchair/BSC Sit to Stand: Min assist           General transfer comment: Pt was able to stand with cues for hand placement amd a little assist to power up.    Ambulation/Gait Ambulation/Gait assistance: Min assist, Min guard Gait Distance (Feet): 50 Feet Assistive device: Rolling walker (2 wheels) Gait Pattern/deviations: Step-to pattern   Gait velocity interpretation: 1.31 - 2.62 ft/sec, indicative of limited community ambulator   General Gait Details: Pt could not place left foot on floor therefore held the left LE off floor and was able to ambulate with RW in room with overall good safety. Pt with a lot of pain in left hip limiting his ability to place the foot on the floor.  Stairs            Wheelchair Mobility    Modified Rankin (Stroke Patients Only)       Balance Overall balance assessment: Needs assistance Sitting-balance support: No upper extremity supported, Feet supported Sitting balance-Leahy Scale: Good     Standing balance support: Bilateral upper extremity supported, During functional activity Standing balance-Leahy  Scale: Poor Standing balance comment: relies on UE support as he is performing NWB on left LE due to pain                             Pertinent Vitals/Pain Pain Assessment Pain Assessment: Faces Faces Pain Scale: Hurts worst Pain Location: left hip Pain Descriptors / Indicators: Aching, Discomfort, Grimacing,  Guarding Pain Intervention(s): Limited activity within patient's tolerance, Monitored during session, Repositioned, Premedicated before session    Home Living Family/patient expects to be discharged to:: Private residence Living Arrangements: Children Available Help at Discharge: Family;Available 24 hours/day (lives wtih daughter and brother can help, wife works in [place] and flies back and forth) Type of Home: House Home Access: Stairs to enter Entrance Stairs-Rails: Right;Left;Can reach both Technical brewer of Steps: [place]: Multi-level;Able to live on main level with bedroom/bathroom;Full bath on main level Home Equipment: None      Prior Function Prior Level of Function : Independent/Modified Independent;Driving;Working/employed (retired but was active, works out on bike and treadmill 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, newspaper   Dominant Hand: Right    Extremity/Trunk Assessment   Upper Extremity Assessment Upper Extremity Assessment: Defer to OT evaluation    Lower Extremity Assessment Lower Extremity Assessment: LLE deficits/detail LLE: Unable to fully assess due to pain    Cervical / Trunk Assessment Cervical / Trunk Assessment: Normal  Communication   Communication: No difficulties  Cognition Arousal/Alertness: Awake/alert Behavior During Therapy: WFL for tasks assessed/performed Overall Cognitive Status: Within Functional Limits for tasks assessed                                          General Comments      Exercises General Exercises - Lower Extremity Ankle Circles/Pumps: AROM, Both, 10 reps, Seated Quad Sets: AROM, Both, 10 reps, Supine Long Arc Quad: AROM, Both, 10 reps, Seated   Assessment/Plan    PT Assessment Patient needs continued PT services  PT Problem List Decreased activity tolerance;Decreased balance;Decreased mobility;Decreased knowledge of use of DME;Decreased safety awareness;Decreased  knowledge of precautions;Decreased strength;Decreased range of motion;Pain       PT Treatment Interventions DME instruction;Gait training;Functional mobility training;Stair training;Therapeutic activities;Therapeutic exercise;Balance training;Patient/family education    PT Goals (Current goals can be found in the Care Plan section)  Acute Rehab PT Goals Patient Stated Goal: to go home PT Goal Formulation: With patient Time For Goal Achievement: 12/21/21 Potential to Achieve Goals: Good    Frequency Min 6X/week     Co-evaluation               AM-PAC PT "6 Clicks" Mobility  Outcome Measure Help needed turning from your back to your side while in a flat bed without using bedrails?: A Lot Help needed moving from lying on your back to sitting on the side of a flat bed without using bedrails?: A Lot Help needed moving to and from a bed to a chair (including a wheelchair)?: A Little Help needed standing up from a chair using your arms (e.g., wheelchair or bedside chair)?: A Lot Help needed to walk in hospital room?: A Little Help needed climbing 3-5 steps with a railing? : A Lot 6 Click Score: 14    End  of Session Equipment Utilized During Treatment: Gait belt Activity Tolerance: Patient limited by fatigue Patient left: in chair;with call bell/phone within reach;with chair alarm set Nurse Communication: Mobility status PT Visit Diagnosis: Muscle weakness (generalized) (M62.81);Unsteadiness on feet (R26.81);Pain Pain - Right/Left: Left Pain - part of body: Hip    Time: 9968-9570 PT Time Calculation (min) (ACUTE ONLY): 25 min   Charges:   PT Evaluation $PT Eval Moderate Complexity: 1 Mod PT Treatments $Gait Training: 8-22 mins        [facility] M,PT Acute Rehab Services [phone]   Alvira Philips 12/07/2021, 3:45 PM

## 2021-12-08 DIAGNOSIS — M25552 Pain in left hip: Secondary | ICD-10-CM | POA: Diagnosis not present

## 2021-12-08 LAB — GLUCOSE, CAPILLARY
Glucose-Capillary: 276 mg/dL — ABNORMAL HIGH (ref 70–99)
Glucose-Capillary: 193 mg/dL — ABNORMAL HIGH (ref 70–99)
Glucose-Capillary: 138 mg/dL — ABNORMAL HIGH (ref 70–99)
Glucose-Capillary: 178 mg/dL — ABNORMAL HIGH (ref 70–99)

## 2021-12-08 LAB — CBC
HCT: 26.8 % — ABNORMAL LOW (ref 39.0–52.0)
Hemoglobin: 8.6 g/dL — ABNORMAL LOW (ref 13.0–17.0)
MCH: 25.9 pg — ABNORMAL LOW (ref 26.0–34.0)
MCHC: 32.1 g/dL (ref 30.0–36.0)
MCV: 80.7 fL (ref 80.0–100.0)
Platelets: 239 10*3/uL (ref 150–400)
RBC: 3.32 MIL/uL — ABNORMAL LOW (ref 4.22–5.81)
RDW: 17.7 % — ABNORMAL HIGH (ref 11.5–15.5)
WBC: 16.5 10*3/uL — ABNORMAL HIGH (ref 4.0–10.5)
nRBC: 0 % (ref 0.0–0.2)

## 2021-12-08 LAB — BASIC METABOLIC PANEL
Anion gap: 10 (ref 5–15)
BUN: 30 mg/dL — ABNORMAL HIGH (ref 8–23)
CO2: 17 mmol/L — ABNORMAL LOW (ref 22–32)
Calcium: 8 mg/dL — ABNORMAL LOW (ref 8.9–10.3)
Chloride: 108 mmol/L (ref 98–111)
Creatinine, Ser: 1.35 mg/dL — ABNORMAL HIGH (ref 0.61–1.24)
GFR, Estimated: 57 mL/min — ABNORMAL LOW (ref >60)
Glucose, Bld: 223 mg/dL — ABNORMAL HIGH (ref 70–99)
Potassium: 4.5 mmol/L (ref 3.5–5.1)
Sodium: 135 mmol/L (ref 135–145)

## 2021-12-08 NOTE — Progress Notes (Signed)
Physical Therapy Treatment Patient Details Name: Kevin Lopez MRN: [id number] DOB: June 13, 1950 Today's Date: 12/08/2021   History of Present Illness Heyden Jaber is a 71 y.o. male admitted 7/19  presenting with L hip pain with groin radiation. Pt with septic hip and underwent left hip arthroscopy with I&D 7/20.  PMH:  DM; HTN; HLD; RA; SBO and CKD    PT Comments    Patient progressing well and highly motivated to regain independence. Pt eager to work with PT although very sore from recent ambulation with mobility tech. PT educated pt on benefits of mobilizing but not overdoing to manage pain during recovery. Pt ambulated to rehab gym and initiated stair training. Pt required min guarding and cues to step pattern. EOS returned to bed to rest and instructed to continue ankle pumps for circulation. Will continue to progress in acute setting.    Recommendations for follow up therapy are one component of a multi-disciplinary discharge planning process, led by the attending physician.  Recommendations may be updated based on patient status, additional functional criteria and insurance authorization.  Follow Up Recommendations  Home health PT     Assistance Recommended at Discharge Intermittent Supervision/Assistance  Patient can return home with the following A little help with walking and/or transfers;A little help with bathing/dressing/bathroom;Assistance with cooking/housework;Assist for transportation;Help with stairs or ramp for entrance   Equipment Recommendations  Rolling walker (2 wheels);BSC/3in1    Recommendations for Other Services       Precautions / Restrictions Precautions Precautions: Fall Restrictions Weight Bearing Restrictions: No LLE Weight Bearing: Weight bearing as tolerated     Mobility  Bed Mobility Overal bed mobility: Needs Assistance Bed Mobility: Sit to Supine     Supine to sit: Mod assist     General bed mobility comments: Mod assist to  bring bil LE's back onto bed at EOS.    Transfers Overall transfer level: Needs assistance Equipment used: Rolling walker (2 wheels) Transfers: Sit to/from Stand Sit to Stand: Min guard           General transfer comment: cues to power up with UE on armrests, gaurding for safety. pt able to initiate and complete rise with extra time. pt with good reach back to sit EOB at EOS.    Ambulation/Gait Ambulation/Gait assistance: Min guard Gait Distance (Feet): 400 Feet Assistive device: Rolling walker (2 wheels) Gait Pattern/deviations: Step-through pattern, Decreased stride length, Knee flexed in stance - left, Decreased weight shift to left, Antalgic Gait velocity: decr     General Gait Details: guarding for safety, pt maintained safe proximity to RW, no LOB noted. stride length improved over distance and Rt foot with improved heel to toe pattern with distance.   Stairs Stairs: Yes Stairs assistance: Min guard Stair Management: Step to pattern, Two rails, Forwards Number of Stairs: 3 (2x3) General stair comments: cues for pattern "up with good, down with bad" and no overt LOB noted. pt completed 2x, gaurding for safety.   Wheelchair Mobility    Modified Rankin (Stroke Patients Only)       Balance Overall balance assessment: Needs assistance Sitting-balance support: No upper extremity supported, Feet supported Sitting balance-Leahy Scale: Good     Standing balance support: Bilateral upper extremity supported, During functional activity Standing balance-Leahy Scale: Poor                              Cognition Arousal/Alertness: Awake/alert Behavior During Therapy: WFL for tasks assessed/performed  Overall Cognitive Status: Within Functional Limits for tasks assessed                                          Exercises Total Joint Exercises Ankle Circles/Pumps: AROM, Both, 20 reps    General Comments        Pertinent Vitals/Pain  Pain Assessment Pain Assessment: 0-10 Pain Score: 10-Worst pain ever Pain Location: left hip Pain Descriptors / Indicators: Discomfort, Grimacing, Shooting Pain Intervention(s): Limited activity within patient's tolerance, Monitored during session    Home Living Family/patient expects to be discharged to:: Private residence Living Arrangements: Children Available Help at Discharge: Family;Available 24 hours/day (lives wtih daughter and brother can help, wife works in [place] and flies back and forth) Type of Home: House Home Access: Stairs to enter Entrance Stairs-Rails: Right;Left;Can reach both Technical brewer of Steps: [place]: Multi-level;Able to live on main level with bedroom/bathroom;Full bath on main level Home Equipment: None      Prior Function            PT Goals (current goals can now be found in the care plan section) Acute Rehab PT Goals Patient Stated Goal: to go home PT Goal Formulation: With patient Time For Goal Achievement: 12/21/21 Potential to Achieve Goals: Good Progress towards PT goals: Progressing toward goals    Frequency    Min 6X/week      PT Plan Current plan remains appropriate    Co-evaluation              AM-PAC PT "6 Clicks" Mobility   Outcome Measure  Help needed turning from your back to your side while in a flat bed without using bedrails?: A Lot Help needed moving from lying on your back to sitting on the side of a flat bed without using bedrails?: A Lot Help needed moving to and from a bed to a chair (including a wheelchair)?: A Little Help needed standing up from a chair using your arms (e.g., wheelchair or bedside chair)?: A Little Help needed to walk in hospital room?: A Little Help needed climbing 3-5 steps with a railing? : A Little 6 Click Score: 16    End of Session Equipment Utilized During Treatment: Gait belt Activity Tolerance: Patient tolerated treatment well Patient left: in bed;with  call bell/phone within reach;with bed alarm set Nurse Communication: Mobility status PT Visit Diagnosis: Muscle weakness (generalized) (M62.81);Unsteadiness on feet (R26.81);Pain Pain - Right/Left: Left Pain - part of body: Hip     Time: 7482-7078 PT Time Calculation (min) (ACUTE ONLY): 34 min  Charges:  $Gait Training: 23-37 mins                     Verner Mould, DPT Acute Rehabilitation Services Office [phone] Pager [phone]  12/08/21 3:38 PM

## 2021-12-08 NOTE — Progress Notes (Signed)
   Subjective:  Patient reports pain as better controlled today.  He is tolerating diet.  Was up and walking with PT.  objective:   VITALS:   Vitals:   12/07/21 1558 12/07/21 1728 12/07/21 2012 12/08/21 0357  BP: 129/60 140/66 (!) 147/73 (!) 169/74  Pulse: 65 71 65 65  Resp: '18  17 19  '$ Temp: 99 F (37.2 C)  97.9 F (36.6 C) 97.8 F (36.6 C)  TempSrc: Oral     SpO2: 100%  100% 100%  Weight:      Height:       Left hip dressings are clean dry intact.  Able to fire tibialis anterior as well as EHL and gastrocsoleus.  Sensation is intact in all distributions lower extremity.  2+ dorsalis pedis pulse.  He does have some tenderness with bilateral although this is improved compared to preop. 20  IR/ER hip in bed relatively well today  Lab Results  Component Value Date   WBC 16.5 (H) 12/08/2021   HGB 8.6 (L) 12/08/2021   HCT 26.8 (L) 12/08/2021   MCV 80.7 12/08/2021   PLT 239 12/08/2021     Assessment/Plan:  2 Days Post-Op s/p left hip arthroscopy for irrigation and debridement  - Expected postop acute blood loss anemia - will monitor for symptoms - Patient to work with PT/OT to optimize mobilization safely - DVT ppx - SCDs, ambulation, aspirin 325 - Agree with antibiotics pending negative cultures for 2 days and steroids to treat pseudogout - WBAT operative extremity - Pain control - multimodal pain management, ATC acetaminophen in conjunction with as needed narcotic (oxycodone), although this should be minimized with other modalities   Tywana Robotham 12/08/2021, 7:42 AM

## 2021-12-08 NOTE — Progress Notes (Signed)
PROGRESS NOTE   Kevin Lopez  ZOX:[id number] DOB: [birth year] DOA: 12/05/2021 PCP: [name], MD  Brief Narrative:  71 year old black male known HTN, rheumatoid arthritis follows with rheumatology Dr.Tauseef Dossie Der, CKD 2 Prior abdominal surgeries resulting with SBO's and colonic ileus in the past--also had a Presented to [facility] 7/19 had x-rays n episode of ischemic colitis back in 2017 Presented to ER after 3 days of mild right groin pain with compression  WBC was high-transferred to [facility] for concerns of septic On MRI  Hospital-Problem based course  Septic arthritis vs Pseudogout Await cultures--fluid Does show Calcium Pyrophosphate additionally-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 raises risk for this being Infectious  superimposed upon crystallarthropathy Continue ceftriaxone at this time--White count is improving although this may be just inflammatory Still requiring Dilaudid 2 mg q2 prn for severe pain in addition to oral meds  Is on Solumedrol 40 q12--would use cautiously Colchicine 0.6 twice daily was added to Oxy IR 5-10 every 4 as needed RH Arthritis on monthly Orencia as OP Would continue management as OP with rheumatologist 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 Htn Continue Amlodipine 10, Coreg 12.5 bid, Hydralazine 25 bid Anxiety/depression Cont Atarax 25 qd prn, Clonopin 1 mg qd  DVT prophylaxis: scd Code Status: Full Family Communication: none present Disposition:  Status is: Inpatient Remains inpatient appropriate because:   Needs further work-up   Consultants:  [facility]  Procedures:   Antimicrobials: as above    Subjective:  Pain seems better controlled well is able to move around but still required some IV pain  meds No chest pain no fever No nausea no vomiting Eating and drinking Able to dorsi and plantarflex at the foot I did not examine the wound today  Objective: Vitals:   12/07/21 1728 12/07/21 2012 12/08/21 0357 12/08/21 0852  BP: 140/66 (!) 147/73 (!) 169/74 (!) 170/78  Pulse: 71 65 65 67  Resp:  '17 19 17  '$ Temp:  97.9 F (36.6 C) 97.8 F (36.6 C) 98.6 F (37 C)  TempSrc:    Oral  SpO2:  100% 100% 100%  Weight:      Height:        Intake/Output Summary (Last 24 hours) at 12/08/2021 0933 Last data filed at 12/08/2021 0856 Gross per 24 hour  Intake 2515.21 ml  Output 1950 ml  Net 565.21 ml    Filed Weights   12/05/21 1826  Weight: 73.9 kg    Examination:  EOMI NCAT no focal deficit CTA B no added sound wheeze Abdomen soft no rebound S1-S2 no murmur Neurologically intact  Data Reviewed: personally reviewed   CBC    Component Value Date/Time   WBC 16.5 (H) 12/08/2021 0343   RBC 3.32 (L) 12/08/2021 0343   HGB 8.6 (L) 12/08/2021 0343   HGB 10.2 (L) 07/31/2021 1426   HCT 26.8 (L) 12/08/2021 0343   HCT 31.8 (L) 07/31/2021 1426   PLT 239 12/08/2021 0343   MCV 80.7 12/08/2021 0343   MCH 25.9 (L) 12/08/2021 0343   MCHC 32.1 12/08/2021 0343   RDW 17.7 (H) 12/08/2021 0343   LYMPHSABS 4.0 04/18/2020 1331   MONOABS 0.7 04/18/2020 1331   EOSABS 0.3 04/18/2020 1331   BASOSABS 0.1 04/18/2020 1331  Latest Ref Rng & Units 12/08/2021    3:43 AM 12/07/2021    5:30 AM 12/05/2021    7:55 PM  CMP  Glucose 70 - 99 mg/dL 223  128  135   BUN 8 - 23 mg/dL 30  26  32   Creatinine 0.61 - 1.24 mg/dL 1.35  1.41  1.70   Sodium 135 - 145 mmol/L 135  137  137   Potassium 3.5 - 5.1 mmol/L 4.5  4.3  4.1   Chloride 98 - 111 mmol/L 108  105  107   CO2 22 - 32 mmol/L '17  22  21   '$ Calcium 8.9 - 10.3 mg/dL 8.0  8.6  9.2      Radiology Studies: DG HIP UNILAT WITH PELVIS 1V LEFT  Result Date: 12/06/2021 CLINICAL DATA:  Left hip arthroscopy, irrigation and debridement. EXAM: DG  HIP (WITH OR WITHOUT PELVIS) 1V*L* COMPARISON:  12/05/2021. FINDINGS: Three fluoroscopic images were obtained intraoperatively. Total fluoroscopy time is 45 seconds. Dose: 6.04 mGy. Images demonstrate surgical instruments at the left hip. Please see operative report for additional information. IMPRESSION: Intraoperative utilization of fluoroscopy. Electronically Signed   By: Brett Fairy M.D.   On: 12/06/2021 20:54   DG C-Arm 1-60 Min-No Report  Result Date: 12/06/2021 Fluoroscopy was utilized by the requesting physician.  No radiographic interpretation.   DG FLUORO GUIDED NEEDLE PLC ASPIRATION/INJECTION LOC  Result Date: 12/06/2021 CLINICAL DATA:  Left hip joint effusion, concern for infection. EXAM: LEFT HIP ARTHROCENTESIS UNDER FLUOROSCOPY COMPARISON:  MRI of the left hip 12/06/2021. FLUOROSCOPY: Fluoroscopy time: 15 seconds (0.90 mGy). PROCEDURE: Clovia Cuff, PA-C obtained informed consent from the patient prior to the procedure. This process included a discussion of procedural risks. An appropriate skin entry site was determined under fluoroscopy and marked. A time-out was performed. The operator donned sterile gloves and a mask. The skin entry site was prepped with Betadine, draped in the usual sterile fashion and infiltrated locally with buffered lidocaine. Under fluoroscopic guidance, a 20-gauge needle was advanced into the joint space at the superolateral aspect of the femoral head/neck junction. 12 mL of synovial fluid was aspirated from the joint space. The needle was removed and a dressing was applied to the skin entry site. The patient tolerated the procedure well, and no immediate post-procedure complication was apparent. The examination was performed by Clovia Cuff, PA-C, and with supervised and interpreted by Kellie Simmering, D.O. IMPRESSION: Technically successful fluoroscopically-guided left hip arthrocentesis. 12 mL of synovial fluid aspirated and sent to the laboratory for analysis. No  immediate post-procedure complication. Electronically Signed   By: Kellie Simmering D.O.   On: 12/06/2021 14:22     Scheduled Meds:  amLODipine  10 mg Oral Daily   aspirin  325 mg Oral Daily   carvedilol  12.5 mg Oral BID WC   colchicine  0.6 mg Oral BID   docusate sodium  100 mg Oral BID   feeding supplement  237 mL Oral BID BM   hydrALAZINE  25 mg Oral BID   insulin aspart  0-15 Units Subcutaneous TID WC   insulin aspart  0-5 Units Subcutaneous QHS   methylPREDNISolone (SOLU-MEDROL) injection  40 mg Intravenous Q12H   multivitamin with minerals  1 tablet Oral Daily   mupirocin ointment  1 Application Nasal BID   pravastatin  20 mg Oral q1800   Continuous Infusions:  sodium chloride 75 mL/hr at 12/08/21 0814   cefTRIAXone (ROCEPHIN)  IV 2 g (12/07/21 1755)  methocarbamol (ROBAXIN) IV       LOS: 2 days   Time spent: 45  Nita Sells, MD [facility] To contact the attending provider between 7A-7P or the covering provider during after hours 7P-7A, please log into the web site [email or web address] and access using universal [place] password for that web site. If you do not have the password, please call the hospital operator.  12/08/2021, 9:33 AM

## 2021-12-08 NOTE — Progress Notes (Signed)
Mobility Specialist Criteria Algorithm Info.   12/08/21 1340  Pain Assessment  Pain Assessment 0-10  Pain Score 9  Pain Descriptors / Indicators Discomfort;Grimacing;Shooting  Pain Intervention(s) Limited activity within patient's tolerance;Monitored during session;Premedicated before session  Mobility  Activity Ambulated with assistance in hallway;Ambulated with assistance to bathroom  Range of Motion/Exercises Active;All extremities  Level of Assistance Standby assist, set-up cues, supervision of patient - no hands on  Assistive Device Front wheel walker  LLE Weight Bearing WBAT  Distance Ambulated (ft) 300 ft  Activity Response Tolerated well   Patient received in recliner chair agreeable to participate in mobility. Ambulated supervision level with slow steady gait. Tolerated bearing weight through LLE better today but with heavy reliance of UE's on RW. Required standing rest break x2 second to UE fatigue. Returned to room without complaint or incident. Was left in recliner chair with all needs met, call bell in reach.   12/08/2021 1:41 PM  Kevin Lopez, [place], [place]  CKFWB:[phone] Office: [phone]

## 2021-12-08 NOTE — Evaluation (Signed)
Occupational Therapy Evaluation Patient Details Name: Danielle Lento MRN: [id number] DOB: Sep 29, 1950 Today's Date: 12/08/2021   History of Present Illness Kevin Lopez is a 71 y.o. male admitted 7/19  presenting with L hip pain with groin radiation. Pt with septic hip and underwent left hip arthroscopy with I&D 7/20.  PMH:  DM; HTN; HLD; RA; SBO and CKD   Clinical Impression   Pt is s/p L hip arthroscopy resulting in the deficits listed below (see OT Problem List).  Pt will benefit from skilled OT to increase their safety and independence with ADL and functional mobility for ADL to facilitate discharge to venue listed below.          Recommendations for follow up therapy are one component of a multi-disciplinary discharge planning process, led by the attending physician.  Recommendations may be updated based on patient status, additional functional criteria and insurance authorization.   Follow Up Recommendations  No OT follow up    Assistance Recommended at Discharge PRN  Patient can return home with the following Assistance with cooking/housework    Functional Status Assessment  Patient has had a recent decline in their functional status and demonstrates the ability to make significant improvements in function in a reasonable and predictable amount of time.  Equipment Recommendations  BSC/3in1    Recommendations for Other Services       Precautions / Restrictions Precautions Precautions: Fall Restrictions Weight Bearing Restrictions: No LLE Weight Bearing: Weight bearing as tolerated      Mobility Bed Mobility Overal bed mobility: Needs Assistance Bed Mobility: Supine to Sit, Sit to Supine     Supine to sit: Mod assist Sit to supine: Mod assist        Transfers Overall transfer level: Needs assistance Equipment used: Rolling walker (2 wheels) Transfers: Sit to/from Stand Sit to Stand: Min assist                  Balance Overall balance  assessment: Needs assistance Sitting-balance support: No upper extremity supported, Feet supported Sitting balance-Leahy Scale: Good     Standing balance support: Bilateral upper extremity supported, During functional activity Standing balance-Leahy Scale: Poor                             ADL either performed or assessed with clinical judgement   ADL Overall ADL's : Needs assistance/impaired Eating/Feeding: Independent;Sitting   Grooming: Set up;Sitting   Upper Body Bathing: Set up;Sitting   Lower Body Bathing: Moderate assistance;Sit to/from stand;Cueing for safety;Cueing for sequencing   Upper Body Dressing : Set up;Sitting   Lower Body Dressing: Moderate assistance;Sit to/from stand;Cueing for safety;Cueing for sequencing   Toilet Transfer: Minimal assistance;BSC/3in1;Rolling walker (2 wheels);Ambulation   Toileting- Clothing Manipulation and Hygiene: Minimal assistance;Sit to/from stand         General ADL Comments: pt will have some A at home but desires to be mod I     Vision   Vision Assessment?: No apparent visual deficits            Pertinent Vitals/Pain Pain Assessment Pain Score: 7  Pain Location: left hip Pain Descriptors / Indicators: Discomfort, Grimacing, Shooting Pain Intervention(s): Limited activity within patient's tolerance, Ice applied     Hand Dominance Right   Extremity/Trunk Assessment Upper Extremity Assessment Upper Extremity Assessment: Overall WFL for tasks assessed           Communication Communication Communication: No difficulties   Cognition Arousal/Alertness: Awake/alert  Behavior During Therapy: WFL for tasks assessed/performed Overall Cognitive Status: Within Functional Limits for tasks assessed                                                  Home Living Family/patient expects to be discharged to:: Private residence Living Arrangements: Children Available Help at Discharge:  Family;Available 24 hours/day (lives wtih daughter and brother can help, wife works in [place] and flies back and forth) Type of Home: House Home Access: Stairs to enter Technical brewer of Steps: 3 Entrance Stairs-Rails: Right;Left;Can reach both Home Layout: Multi-level;Able to live on main level with bedroom/bathroom;Full bath on main level     Bathroom Shower/Tub: Occupational psychologist: Standard     Home Equipment: None          Prior Functioning/Environment Prior Level of Function : Independent/Modified Independent;Driving;Working/employed (retired but was active, works out on bike and treadmill at home)                        OT Problem List: Decreased strength;Pain;Decreased knowledge of use of DME or AE      OT Treatment/Interventions: Self-care/ADL training;Patient/family education;DME and/or AE instruction    OT Goals(Current goals can be found in the care plan section) Acute Rehab OT Goals Patient Stated Goal: get well OT Goal Formulation: With patient Time For Goal Achievement: 12/22/21 ADL Goals Pt Will Perform Lower Body Dressing: (P) with modified independence;sit to/from stand Pt Will Transfer to Toilet: (P) with modified independence;ambulating Pt Will Perform Toileting - Clothing Manipulation and hygiene: (P) with modified independence;sit to/from stand Pt Will Perform Tub/Shower Transfer: (P) with modified independence;ambulating  OT Frequency: Min 2X/week       AM-PAC OT "6 Clicks" Daily Activity     Outcome Measure Help from another person eating meals?: None Help from another person taking care of personal grooming?: None   Help from another person bathing (including washing, rinsing, drying)?: A Little Help from another person to put on and taking off regular upper body clothing?: None Help from another person to put on and taking off regular lower body clothing?: A Little 6 Click Score: 18   End of Session Equipment  Utilized During Treatment: Rolling walker (2 wheels) Nurse Communication: Mobility status  Activity Tolerance: Patient limited by pain Patient left: in bed  OT Visit Diagnosis: Unsteadiness on feet (R26.81);Pain Pain - part of body: Hip                   Charges:  OT General Charges $OT Visit: 1 Visit OT Evaluation $OT Eval Low Complexity: 1 Low    Jaymen Fetch, Thereasa Parkin 12/08/2021, 3:42 PM

## 2021-12-09 DIAGNOSIS — M25552 Pain in left hip: Secondary | ICD-10-CM | POA: Diagnosis not present

## 2021-12-09 LAB — BODY FLUID CULTURE W GRAM STAIN: Culture: NO GROWTH

## 2021-12-09 LAB — CBC
HCT: 26.9 % — ABNORMAL LOW (ref 39.0–52.0)
Hemoglobin: 8.8 g/dL — ABNORMAL LOW (ref 13.0–17.0)
MCH: 26 pg (ref 26.0–34.0)
MCHC: 32.7 g/dL (ref 30.0–36.0)
MCV: 79.4 fL — ABNORMAL LOW (ref 80.0–100.0)
Platelets: 380 10*3/uL (ref 150–400)
RBC: 3.39 MIL/uL — ABNORMAL LOW (ref 4.22–5.81)
RDW: 17.3 % — ABNORMAL HIGH (ref 11.5–15.5)
WBC: 18 10*3/uL — ABNORMAL HIGH (ref 4.0–10.5)
nRBC: 0 % (ref 0.0–0.2)

## 2021-12-09 LAB — GLUCOSE, CAPILLARY
Glucose-Capillary: 154 mg/dL — ABNORMAL HIGH (ref 70–99)
Glucose-Capillary: 267 mg/dL — ABNORMAL HIGH (ref 70–99)
Glucose-Capillary: 160 mg/dL — ABNORMAL HIGH (ref 70–99)
Glucose-Capillary: 132 mg/dL — ABNORMAL HIGH (ref 70–99)

## 2021-12-09 LAB — BASIC METABOLIC PANEL
Anion gap: 6 (ref 5–15)
BUN: 25 mg/dL — ABNORMAL HIGH (ref 8–23)
CO2: 19 mmol/L — ABNORMAL LOW (ref 22–32)
Calcium: 8.4 mg/dL — ABNORMAL LOW (ref 8.9–10.3)
Chloride: 112 mmol/L — ABNORMAL HIGH (ref 98–111)
Creatinine, Ser: 1.28 mg/dL — ABNORMAL HIGH (ref 0.61–1.24)
GFR, Estimated: >60 mL/min (ref >60)
Glucose, Bld: 167 mg/dL — ABNORMAL HIGH (ref 70–99)
Potassium: 4.6 mmol/L (ref 3.5–5.1)
Sodium: 137 mmol/L (ref 135–145)

## 2021-12-09 NOTE — Progress Notes (Signed)
PROGRESS NOTE   Kevin Lopez  RAX:[id number] DOB: [birth year] DOA: 12/05/2021 PCP: [name], MD  Brief Narrative:  71 year old black male known HTN, rheumatoid arthritis follows with rheumatology Dr.Tauseef Dossie Der, CKD 2 Prior abdominal surgeries resulting with SBO's and colonic ileus in the past--also had a Presented to [facility] 7/19 had x-rays n episode of ischemic colitis back in 2017 Presented to ER after 3 days of mild right groin pain with compression  WBC was high-transferred to [facility] for concerns of septic On MRI  Hospital-Problem based course  Pseudogout Aspirate fluid Does show Calcium Pyrophosphate  Ceftriaxone discontinued 7/23 as cultures x2 are negative--if continue to be negative would attribute all of this to pseudogout Continuing Dilaudid 2 mg q2 prn for severe pain Is on Solumedrol 40 q12--would use cautiously start weaning steroids in a.m. 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 this admit As OP on linagliptin 5 mg, farxiga 5 mg daily CBG 1 50-200 CKD II Continue saline 75 cc/h--no specific AKI at this time Htn Continue Amlodipine 10, Coreg 12.5 bid, Hydralazine 25 bid Anxiety/depression Cont Atarax 25 qd prn, Clonopin 1 mg qd  DVT prophylaxis: scd Code Status: Full Family Communication: none present Disposition:  Status is: Inpatient Remains inpatient appropriate because:   Needs further work-up   Consultants:  [facility]  Procedures:   Antimicrobials: as above    Subjective:  Overall is improving some-has been ambulatory walked around the unit X2 yesterday No chest pain no fever   Objective: Vitals:   12/08/21 0852 12/08/21 1722 12/08/21 1923 12/09/21 0919  BP: (!) 170/78 (!) 150/72 (!) 163/81 (!) 167/74  Pulse: 67 66 73 (!) 49  Resp: '17  17 18  '$ Temp: 98.6 F (37 C) 98.3 F (36.8 C) 98.4 F (36.9  C) 98.6 F (37 C)  TempSrc: Oral Oral  Oral  SpO2: 100% 100% 98% 99%  Weight:      Height:        Intake/Output Summary (Last 24 hours) at 12/09/2021 1013 Last data filed at 12/09/2021 0352 Gross per 24 hour  Intake 1447.82 ml  Output 1200 ml  Net 247.82 ml    Filed Weights   12/05/21 1826  Weight: 73.9 kg    Examination:  Eomi ncat no focal deficit Cta b no rales rhonchi wheeze Abd soft nt nd no rebound no gaurd S1 s 2no m/r/g Rom grossly intact--he has a little bit of tenderness with external rotation at the left hip  Data Reviewed: personally reviewed   CBC    Component Value Date/Time   WBC 18.0 (H) 12/09/2021 0702   RBC 3.39 (L) 12/09/2021 0702   HGB 8.8 (L) 12/09/2021 0702   HGB 10.2 (L) 07/31/2021 1426   HCT 26.9 (L) 12/09/2021 0702   HCT 31.8 (L) 07/31/2021 1426   PLT 380 12/09/2021 0702   MCV 79.4 (L) 12/09/2021 0702   MCH 26.0 12/09/2021 0702   MCHC 32.7 12/09/2021 0702   RDW 17.3 (H) 12/09/2021 0702   LYMPHSABS 4.0 04/18/2020 1331   MONOABS 0.7 04/18/2020 1331   EOSABS 0.3 04/18/2020 1331   BASOSABS 0.1 04/18/2020 1331      Latest Ref Rng & Units 12/09/2021    7:02 AM 12/08/2021    3:43 AM 12/07/2021    5:30 AM  CMP  Glucose 70 - 99 mg/dL 167  223  128  BUN 8 - 23 mg/dL '25  30  26   '$ Creatinine 0.61 - 1.24 mg/dL 1.28  1.35  1.41   Sodium 135 - 145 mmol/L 137  135  137   Potassium 3.5 - 5.1 mmol/L 4.6  4.5  4.3   Chloride 98 - 111 mmol/L 112  108  105   CO2 22 - 32 mmol/L '19  17  22   '$ Calcium 8.9 - 10.3 mg/dL 8.4  8.0  8.6      Radiology Studies: No results found.   Scheduled Meds:  amLODipine  10 mg Oral Daily   aspirin  325 mg Oral Daily   carvedilol  12.5 mg Oral BID WC   colchicine  0.6 mg Oral BID   docusate sodium  100 mg Oral BID   feeding supplement  237 mL Oral BID BM   hydrALAZINE  25 mg Oral BID   insulin aspart  0-15 Units Subcutaneous TID WC   insulin aspart  0-5 Units Subcutaneous QHS   methylPREDNISolone  (SOLU-MEDROL) injection  40 mg Intravenous Q12H   multivitamin with minerals  1 tablet Oral Daily   mupirocin ointment  1 Application Nasal BID   pravastatin  20 mg Oral q1800   Continuous Infusions:  sodium chloride 75 mL/hr at 12/09/21 0322   methocarbamol (ROBAXIN) IV       LOS: 3 days   Time spent: 37  Nita Sells, MD [facility] To contact the attending provider between 7A-7P or the covering provider during after hours 7P-7A, please log into the web site [email or web address] and access using universal [place] password for that web site. If you do not have the password, please call the hospital operator.  12/09/2021, 10:13 AM

## 2021-12-09 NOTE — Progress Notes (Signed)
Mobility Specialist Criteria Algorithm Info.   12/09/21 1535  Pain Assessment  Pain Assessment 0-10  Pain Score 8  Pain Location LLE/HIP  Pain Descriptors / Indicators Discomfort;Grimacing;Guarding  Pain Intervention(s) Patient requesting pain meds-RN notified;Limited activity within patient's tolerance  Mobility  Activity Ambulated with assistance in hallway  Range of Motion/Exercises Active;All extremities  Level of Assistance  (to chair after ambulation)  Assistive Device Front wheel walker  LLE Weight Bearing WBAT  Distance Ambulated (ft) 570 ft  Activity Response Tolerated well   Patient received in supine eager to participate in mobility despite 9/10 pain. Ambulated min guard with slow steady gait. Patient requested stair training midway during ambulation. Was able to negotiate stairs with supervision and minimal pain. Returned to room without complaint or incident. Was left in recliner chair with all needs met, call bell in reach.   12/09/2021 5:01 PM  Kevin Lopez, [place], [place]  ILUYY:[phone] Office: [phone]

## 2021-12-09 NOTE — Plan of Care (Signed)

## 2021-12-10 DIAGNOSIS — M25552 Pain in left hip: Secondary | ICD-10-CM | POA: Diagnosis not present

## 2021-12-10 LAB — BASIC METABOLIC PANEL
Anion gap: 6 (ref 5–15)
BUN: 27 mg/dL — ABNORMAL HIGH (ref 8–23)
CO2: 20 mmol/L — ABNORMAL LOW (ref 22–32)
Calcium: 8.4 mg/dL — ABNORMAL LOW (ref 8.9–10.3)
Chloride: 109 mmol/L (ref 98–111)
Creatinine, Ser: 1.3 mg/dL — ABNORMAL HIGH (ref 0.61–1.24)
GFR, Estimated: 59 mL/min — ABNORMAL LOW (ref >60)
Glucose, Bld: 286 mg/dL — ABNORMAL HIGH (ref 70–99)
Potassium: 5 mmol/L (ref 3.5–5.1)
Sodium: 135 mmol/L (ref 135–145)

## 2021-12-10 LAB — CBC WITH DIFFERENTIAL/PLATELET
Abs Immature Granulocytes: 0.19 10*3/uL — ABNORMAL HIGH (ref 0.00–0.07)
Basophils Absolute: 0 10*3/uL (ref 0.0–0.1)
Basophils Relative: 0 %
Eosinophils Absolute: 0 10*3/uL (ref 0.0–0.5)
Eosinophils Relative: 0 %
HCT: 26.7 % — ABNORMAL LOW (ref 39.0–52.0)
Hemoglobin: 8.7 g/dL — ABNORMAL LOW (ref 13.0–17.0)
Immature Granulocytes: 1 %
Lymphocytes Relative: 14 %
Lymphs Abs: 2.2 10*3/uL (ref 0.7–4.0)
MCH: 25.4 pg — ABNORMAL LOW (ref 26.0–34.0)
MCHC: 32.6 g/dL (ref 30.0–36.0)
MCV: 78.1 fL — ABNORMAL LOW (ref 80.0–100.0)
Monocytes Absolute: 0.8 10*3/uL (ref 0.1–1.0)
Monocytes Relative: 5 %
Neutro Abs: 12.8 10*3/uL — ABNORMAL HIGH (ref 1.7–7.7)
Neutrophils Relative %: 80 %
Platelets: 392 10*3/uL (ref 150–400)
RBC: 3.42 MIL/uL — ABNORMAL LOW (ref 4.22–5.81)
RDW: 17.2 % — ABNORMAL HIGH (ref 11.5–15.5)
WBC: 15.9 10*3/uL — ABNORMAL HIGH (ref 4.0–10.5)
nRBC: 0 % (ref 0.0–0.2)

## 2021-12-10 LAB — GLUCOSE, CAPILLARY
Glucose-Capillary: 192 mg/dL — ABNORMAL HIGH (ref 70–99)
Glucose-Capillary: 267 mg/dL — ABNORMAL HIGH (ref 70–99)

## 2021-12-10 MED ORDER — OXYCODONE HCL 5 MG PO TABS: 5.0000 mg | ORAL_TABLET | ORAL | 0 refills | Status: DC | PRN

## 2021-12-10 MED ORDER — PREDNISONE 50 MG PO TABS: ORAL_TABLET | ORAL | 0 refills | Status: DC

## 2021-12-10 MED ORDER — METHOCARBAMOL 500 MG PO TABS
500.0000 mg | ORAL_TABLET | Freq: Four times a day (QID) | ORAL | 0 refills | Status: DC | PRN
Start: 2021-12-10 — End: 2022-07-08

## 2021-12-10 MED ORDER — COLCHICINE 0.6 MG PO TABS: 0.6000 mg | ORAL_TABLET | Freq: Two times a day (BID) | ORAL | 0 refills | Status: DC

## 2021-12-10 NOTE — Care Management Important Message (Signed)
Important Message  Patient Details  Name: Kevin Lopez MRN: [id number] Date of Birth: [birth year]   Medicare Important Message Given:  Yes     Aishwarya Shiplett [address] 12/10/2021, 3:49 PM

## 2021-12-10 NOTE — Discharge Summary (Signed)
Physician Discharge Summary  Emily Forse IOX:[id number] DOB: 06/26/1950 DOA: 12/05/2021  PCP: [name], MD  Admit date: 12/05/2021 Discharge date: 12/10/2021  Time spent: 36 minutes  Recommendations for Outpatient Follow-up:  Needs outpatient follow-up in about 1 week and get labs CBC Chem-12 Recommend continuation of Orencia in the outpatient setting  Discharge Diagnoses:  MAIN problem for hospitalization   Pseudogout in left hip Well controlled DM TY 2 CKD 2 HTN Depression  Please see below for itemized issues addressed in HOpsital- refer to other progress notes for clarity if needed  Discharge Condition: Improved  Diet recommendation: Diabetic heart healthy  Filed Weights   12/05/21 1826  Weight: 73.9 kg    History of present illness:  71 year old black male known HTN, rheumatoid arthritis follows with rheumatology Dr.Tauseef Dossie Der, CKD 2 Prior abdominal surgeries resulting with SBO's and colonic ileus in the past--also had a Presented to [facility] 7/19 had x-rays n episode of ischemic colitis back in 2017 Presented to ER after 3 days of mild right groin pain with compression  WBC was high-transferred to [facility] for concerns of septic On MRI  Hospital Course:  Pseudogout Patient was transferred from [facility] and had hip aspiration --aspirate fluid Does show Calcium Pyrophosphate no organisms and this is not a septic Ceftriaxone discontinued 7/23 as cultures x2 are negative Patient was moderately controlled in terms of pain on oxycodone methocarbamol and a prescription was given for this Solu-Medrol was transitioned to prednisone 50 for 6 days Continue colchicine 0.6 twice daily RH Arthritis on monthly Orencia as OP Would continue management as OP with rheumatologist 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 Sammuel Hines of orthopedics  Discharge Exam: Vitals:   12/09/21 1945 12/10/21 0747  BP: (!) 148/65 (!) 186/80  Pulse: 63 63  Resp: 17 18  Temp: 99 F (37.2 C) 98 F (36.7 C)  SpO2: 99% 100%    Subj on day of d/c   Awake coherent no distress No icterus no pallor neck soft supple S1-S2 no murmur Abdomen soft no rebound no guarding No lower extremity edema   Discharge Instructions   Discharge Instructions     Diet - low sodium heart healthy   Complete by: As directed    Discharge instructions   Complete by: As directed    This hospitalization you were diagnosed with crystals in your joint which caused pain- We feel that this is probably something called pseudogout-it is treated like gout with steroids such as prednisone and a medication called colchicine which brings down the pain-it would take several days for the pain to completely be under control as you are walking around able to bear weight we will call in a prescription of both of these medications to your pharmacy Once your pain is in control-you can stop the colchicine until then use it scheduled twice daily We have prescribed oxycodone and also a muscle relaxant methocarbamol for your pain as well-these can be taken as needed in addition to the meds as above Please follow-up in the outpatient setting with your regular primary care physician and rheumatologist  If  you have high fevers chills or other issues please return to the emergency room immediately   Increase activity slowly   Complete by: As directed    No wound care   Complete by: As directed       Allergies as of 12/10/2021       Reactions   Latex Shortness Of Breath   Irbesartan Hives   swelling   Metformin Diarrhea, Nausea And  Vomiting, Swelling        Medication List     TAKE these medications    acetaminophen 500 MG tablet Commonly known as: TYLENOL Take 500 mg by mouth every 6 (six) hours as needed for moderate pain.   amLODipine 10 MG tablet Commonly known as: NORVASC Take 10 mg by mouth daily.   B-12 PO Take 1 capsule by mouth daily.   carvedilol 12.5 MG tablet Commonly known as: COREG Take 12.5 mg by mouth 2 (two) times daily with a meal.   clonazePAM 1 MG tablet Commonly known as: KLONOPIN Take 1 mg by mouth as needed for anxiety.   colchicine 0.6 MG tablet Take 1 tablet (0.6 mg total) by mouth 2 (two) times daily.   dapagliflozin propanediol 5 MG Tabs tablet Commonly known as: FARXIGA Take 5 mg by mouth daily.   ergocalciferol 1.25 MG (50000 UT) capsule Commonly known as: VITAMIN D2 Take 50,000 Units by mouth once a week.   fluticasone 50 MCG/ACT nasal spray Commonly known as: FLONASE Place 1 spray into both nostrils daily as needed for allergies.   hydrALAZINE 25 MG tablet Commonly known as: APRESOLINE Take 25 mg by mouth in the morning and at bedtime.   hydrOXYzine 25 MG tablet Commonly known as: ATARAX Take 25 mg by mouth daily as needed for anxiety.   linagliptin 5 MG Tabs tablet Commonly known as: TRADJENTA Take 5 mg by mouth daily.   lovastatin 20 MG tablet Commonly known as: MEVACOR Take 20 mg by mouth daily at 12 noon.   methocarbamol 500 MG tablet Commonly known as: ROBAXIN Take 1 tablet (500 mg total) by mouth every 6 (six) hours as needed for muscle spasms.   multivitamin with minerals Tabs tablet Take 1 tablet by mouth daily.   Orencia 125 MG/ML Sosy Generic drug: Abatacept Inject 125 mg into the skin every 30 (thirty) days.   oxyCODONE 5 MG immediate release tablet Commonly known as: Oxy IR/ROXICODONE Take 1-2 tablets (5-10 mg total) by mouth every 4 (four) hours as needed for breakthrough pain ((for MODERATE breakthrough pain)).   predniSONE  50 MG tablet Commonly known as: DELTASONE Take 1 daily till all completed   vitamin C 1000 MG tablet Take 1,000 mg by mouth daily.       Allergies  Allergen Reactions   Latex Shortness Of Breath   Irbesartan Hives    swelling    Metformin Diarrhea, Nausea And Vomiting and Swelling    Follow-up Information     Vanetta Mulders, MD Follow up.   Specialty: Orthopedic Surgery Contact information: [address] [place] [address] [phone]                  The results of significant diagnostics from this hospitalization (including imaging, microbiology, ancillary and laboratory) are listed below for reference.    Significant Diagnostic Studies: DG HIP UNILAT WITH PELVIS 1V LEFT  Result Date: 12/06/2021 CLINICAL DATA:  Left hip arthroscopy, irrigation and debridement. EXAM: DG HIP (WITH OR WITHOUT PELVIS) 1V*L* COMPARISON:  12/05/2021. FINDINGS: Three fluoroscopic images were obtained intraoperatively. Total fluoroscopy time is 45 seconds. Dose: 6.04 mGy. Images demonstrate surgical instruments at the left hip. Please see operative report for additional information. IMPRESSION: Intraoperative utilization of fluoroscopy. Electronically Signed   By: Brett Fairy M.D.   On: 12/06/2021 20:54   DG C-Arm 1-60 Min-No Report  Result Date: 12/06/2021 Fluoroscopy was utilized by the requesting physician.  No radiographic interpretation.   DG FLUORO GUIDED NEEDLE PLC ASPIRATION/INJECTION LOC  Result Date: 12/06/2021 CLINICAL DATA:  Left hip joint effusion, concern for infection. EXAM: LEFT HIP ARTHROCENTESIS UNDER FLUOROSCOPY COMPARISON:  MRI of the left hip 12/06/2021. FLUOROSCOPY: Fluoroscopy time: 15 seconds (0.90 mGy). PROCEDURE: Clovia Cuff, PA-C obtained informed consent from the patient prior to the procedure. This process included a discussion of procedural risks. An appropriate skin entry site was determined under fluoroscopy and marked. A time-out was  performed. The operator donned sterile gloves and a mask. The skin entry site was prepped with Betadine, draped in the usual sterile fashion and infiltrated locally with buffered lidocaine. Under fluoroscopic guidance, a 20-gauge needle was advanced into the joint space at the superolateral aspect of the femoral head/neck junction. 12 mL of synovial fluid was aspirated from the joint space. The needle was removed and a dressing was applied to the skin entry site. The patient tolerated the procedure well, and no immediate post-procedure complication was apparent. The examination was performed by Clovia Cuff, PA-C, and with supervised and interpreted by Kellie Simmering, D.O. IMPRESSION: Technically successful fluoroscopically-guided left hip arthrocentesis. 12 mL of synovial fluid aspirated and sent to the laboratory for analysis. No immediate post-procedure complication. Electronically Signed   By: Kellie Simmering D.O.   On: 12/06/2021 14:22   MR HIP LEFT W WO CONTRAST  Result Date: 12/06/2021 CLINICAL DATA:  Severe left leg pain. No injury. History of rheumatoid arthritis. EXAM: MRI OF THE LEFT HIP WITHOUT AND WITH CONTRAST TECHNIQUE: Multiplanar, multisequence MR imaging was performed both before and after administration of intravenous contrast. CONTRAST:  7.46m GADAVIST GADOBUTROL 1 MMOL/ML IV SOLN COMPARISON:  Left femur x-rays from yesterday. CT abdomen pelvis dated December 16, 2015. FINDINGS: Bones: There is no evidence of acute fracture, dislocation or avascular necrosis. No erosions. No focal bone lesion. The visualized sacroiliac joints and symphysis pubis appear normal. Articular cartilage and labrum Articular cartilage: No focal chondral defect or subchondral signal abnormality identified. Labrum: Small tear of the left anterior superior labrum. No paralabral abnormality. Joint or bursal effusion Joint effusion: Small left hip joint effusion with synovial enhancement. Bursae: Trace fluid in both greater  trochanteric bursae. Muscles and tendons Muscles and tendons: Periarticular muscle edema and soft tissue swelling about the left hip. The visualized gluteus, hamstring and iliopsoas tendons appear normal. No muscle atrophy. Other findings Miscellaneous: Trace free fluid in the pelvis. The visualized internal pelvic contents appear unremarkable. IMPRESSION: 1. Small left hip joint effusion with synovitis and periarticular inflammatory changes. No erosions. Differential considerations include inflammatory arthropathy and septic arthritis. If there is concern for infection, recommend arthrocentesis. 2. No osteomyelitis or abscess. Electronically Signed   By: WTitus DubinM.D.   On: 12/06/2021 08:10   MR Lumbar Spine W Wo Contrast  Result Date: 12/06/2021 CLINICAL DATA:  Low back pain with spondyloarthropathy suspected EXAM: MRI LUMBAR SPINE WITHOUT AND WITH CONTRAST TECHNIQUE: Multiplanar and multiecho pulse sequences of the lumbar spine were obtained without and with intravenous contrast. CONTRAST:  7.43mGADAVIST GADOBUTROL 1 MMOL/ML IV SOLN COMPARISON:  06/13/2017 report, images currently not available FINDINGS: Segmentation:  5 lumbar type vertebrae Alignment:  Mild dextroscoliosis Vertebrae: Benign heterogeneity of marrow. No fracture, discitis, or aggressive bone lesion Conus medullaris and cauda equina: Conus extends to the L1 level. Conus and cauda equina appear normal. Paraspinal and other soft tissues: Negative for perispinal mass or inflammation Disc levels: T12- L1: Unremarkable. L1-L2: Disc narrowing and bulging.  Posterior annular fissure. L2-L3: Right foraminal and extraforaminal herniation without nerve root compression. L3-L4: Disc collapse with endplate ridging and endplate degeneration eccentric to the left where there is also greater facet spurring. Left foraminal impingement with spur and disc compressing the L3 nerve root on sagittal images. Moderate spinal stenosis L4-L5: Disc narrowing  and bulging with right inferior foraminal protrusion. Accentuated right facet spurring. Right foraminal impingement. L5-S1:Disc narrowing and mild bulging. IMPRESSION: 1. No acute finding. No significant change when compared to MRI report from 2019. 2. Degenerative foraminal impingement on the left at L3-4 and right at L4-5. 3. L3-4 moderate spinal stenosis. Electronically Signed   By: Jorje Guild M.D.   On: 12/06/2021 05:57   DG Femur Min 2 Views Left  Result Date: 12/05/2021 CLINICAL DATA:  Pain EXAM: LEFT FEMUR 2 VIEWS COMPARISON:  None Available. FINDINGS: No fracture or dislocation is seen. Degenerative changes with small bony spurs are seen in left knee. Minimal bony spurs are seen in the medial aspect of the left hip joint. There are no focal lytic lesions. IMPRESSION: No fracture or dislocation is seen. Degenerative changes are noted in left knee. Minimal bony spurs are seen in left hip. Electronically Signed   By: Elmer Picker M.D.   On: 12/05/2021 19:52   PCV MYOCARDIAL PERFUSION WO LEXISCAN  Result Date: 11/17/2021 Exercise Myoview stress test 11/14/2021: Exercise nuclear stress test was performed using Bruce protocol.  1 Day Rest and Stress images. Exercise time 5 minutes 55 seconds, achieved 7.05 METS, 85% APMHR. Stress ECG negative for ischemia -of note, rare PVCs at rest and in recovery. Normal myocardial perfusion without evidence of reversible myocardial ischemia or prior infarct. Left ventricular size dilated, no significant regional wall motion abnormalities, calculated LVEF 61%. No prior studies for comparison. Low risk study.    Microbiology: Recent Results (from the past 240 hour(s))  Body fluid culture w Gram Stain     Status: None   Collection Time: 12/06/21  1:46 PM   Specimen: PATH Cytology Misc. fluid; Synovial Fluid  Result Value Ref Range Status   Specimen Description SYNOVIAL  Final   Special Requests RIGHT HIP  Final   Gram Stain   Final    FEW WBC PRESENT,  PREDOMINANTLY PMN NO ORGANISMS SEEN    Culture   Final    NO GROWTH 3 DAYS Performed at [facility], [address] [place], [place] [address]    Report Status 12/09/2021 FINAL  Final  Surgical PCR screen     Status: None   Collection Time: 12/06/21  6:01 PM   Specimen: Nasal Mucosa; Nasal Swab  Result Value Ref Range Status   MRSA, PCR NEGATIVE NEGATIVE Final   Staphylococcus aureus NEGATIVE NEGATIVE Final    Comment: (NOTE) The Xpert SA Assay (FDA approved for NASAL specimens in patients 71 years of age and older), is one component of a comprehensive surveillance program. It is not intended to diagnose infection nor to guide or monitor treatment. Performed at [facility], [place] [address], [place], [place] [address]  Labs: Basic Metabolic Panel: Recent Labs  Lab 12/05/21 1955 12/07/21 0530 12/08/21 0343 12/09/21 0702 12/10/21 0216  NA 137 137 135 137 135  K 4.1 4.3 4.5 4.6 5.0  CL 107 105 108 112* 109  CO2 21* 22 17* 19* 20*  GLUCOSE 135* 128* 223* 167* 286*  BUN 32* 26* 30* 25* 27*  CREATININE 1.70* 1.41* 1.35* 1.28* 1.30*  CALCIUM 9.2 8.6* 8.0* 8.4* 8.4*   Liver Function Tests: No results for input(s): "AST", "ALT", "ALKPHOS", "BILITOT", "PROT", "ALBUMIN" in the last 168 hours. No results for input(s): "LIPASE", "AMYLASE" in the last 168 hours. No results for input(s): "AMMONIA" in the last 168 hours. CBC: Recent Labs  Lab 12/05/21 1955 12/07/21 0530 12/08/21 0343 12/09/21 0702 12/10/21 0216  WBC 20.4* 20.9* 16.5* 18.0* 15.9*  NEUTROABS  --   --   --   --  12.8*  HGB 9.6* 9.3* 8.6* 8.8* 8.7*  HCT 30.2* 28.6* 26.8* 26.9* 26.7*  MCV 81.4 79.0* 80.7 79.4* 78.1*  PLT 400 404* 239 380 392   Cardiac Enzymes: Recent Labs  Lab 12/05/21 1955  CKTOTAL 113   BNP: BNP (last 3 results) No results for input(s): "BNP" in the last 8760 hours.  ProBNP (last 3 results) Recent Labs    08/06/21 1153  PROBNP 327    CBG: Recent Labs  Lab  12/09/21 0739 12/09/21 1125 12/09/21 1703 12/09/21 1945 12/10/21 0751  GLUCAP 154* 267* 160* 132* 192*       Signed:  Nita Sells MD   [facility] 12/10/2021, 9:18 AM

## 2021-12-10 NOTE — Progress Notes (Signed)
  Mobility Specialist Criteria Algorithm Info.    12/10/21 1220  Mobility  Activity Ambulated with assistance in hallway (in recliner before and after ambulation)  Range of Motion/Exercises Active;All extremities  Level of Assistance Standby assist, set-up cues, supervision of patient - no hands on  Assistive Device Front wheel walker  LLE Weight Bearing WBAT  Distance Ambulated (ft) 800 ft  Activity Response Tolerated well   Patient received in recliner eager to participate in mobility. Ambulated in hallway and negotiated steps x3 with supervision. Pain better managed and tolerating bearing weight through LLE better. Returned to room without complaint or incident. Was left in recliner chair with all needs met, call bell in reach.   12/10/2021 12:20PM  Kevin Lopez, [place], [place]  DNRJF:[phone] Office: [phone]

## 2021-12-10 NOTE — Plan of Care (Signed)

## 2021-12-10 NOTE — TOC Transition Note (Signed)
Transition of Care [facility]) - CM/SW Discharge Note   Patient Details  Name: Kevin Lopez MRN: [id number] Date of Birth: Mar 28, 1951  [facility] CM/SW Contact:  Bartholomew Crews, RN Phone Number: [phone] 12/10/2021, 10:54 AM   Clinical Narrative:     Spoke with patient at the bedside to discuss post acute transition later today. He has transportation home for 3pm. He is agreeable to DME recs for RW and 3N1 - referral to [employer] for delivery to the room. He is agreeable to [facility] PT. Choice offered - no preference. Referral accepted by [facility]. Reviewed DC medications and where to pick up. No further TOC needs identified at this time.   Final next level of care: [place] Barriers to Discharge: No Barriers Identified   Patient Goals and CMS Choice Patient states their goals for this hospitalization and ongoing recovery are:: return home CMS Medicare.gov Compare Post Acute Care list provided to:: Patient Choice offered to / list presented to : Patient  Discharge Placement                       Discharge Plan and Services                DME Arranged: 3-N-1, Walker rolling DME Agency: [employer] Date DME Agency Contacted: 12/10/21 Time DME Agency Contacted: [id number] Representative spoke with at DME Agency: Freda Munro HH Arranged: PT Early: [place] Date [place]: 12/10/21 Time [place]: 1053 Representative spoke with at [place]: Tommi Rumps  Social Determinants of Health ([place]) Interventions     Readmission Risk Interventions     No data to display

## 2021-12-10 NOTE — Progress Notes (Signed)
   Subjective:  Minimal pain today, was able to ambulate up and down hall multiple times. Cultures are no growth.  objective:   VITALS:   Vitals:   12/08/21 1923 12/09/21 0919 12/09/21 1610 12/09/21 1945  BP: (!) 163/81 (!) 167/74 (!) 151/74 (!) 148/65  Pulse: 73 (!) 49 (!) 58 63  Resp: '17 18 20 17  '$ Temp: 98.4 F (36.9 C) 98.6 F (37 C) 98.4 F (36.9 C) 99 F (37.2 C)  TempSrc:  Oral Oral   SpO2: 98% 99%  99%  Weight:      Height:       Left hip dressings are clean dry intact.  Able to fire tibialis anterior as well as EHL and gastrocsoleus.  Sensation is intact in all distributions lower extremity.  2+ dorsalis pedis pulse.  He does have some tenderness with bilateral although this is improved compared to preop. 20  IR/ER hip in bed relatively well today  Lab Results  Component Value Date   WBC 15.9 (H) 12/10/2021   HGB 8.7 (L) 12/10/2021   HCT 26.7 (L) 12/10/2021   MCV 78.1 (L) 12/10/2021   PLT 392 12/10/2021     Assessment/Plan:  4 Days Post-Op s/p left hip arthroscopy for irrigation and debridement  - Patient to work with PT/OT to optimize mobilization safely - DVT ppx - SCDs, ambulation, aspirin 325 for 2 weeks on DC - May DC antibiotics and DC with a steroid taper - WBAT operative extremity - Pain control - multimodal pain management, ATC acetaminophen in conjunction with as needed narcotic (oxycodone), although this should be minimized with other modalities   Kevin Lopez 12/10/2021, 7:01 AM

## 2021-12-12 DIAGNOSIS — M48061 Spinal stenosis, lumbar region without neurogenic claudication: Secondary | ICD-10-CM | POA: Diagnosis not present

## 2021-12-12 DIAGNOSIS — Z7984 Long term (current) use of oral hypoglycemic drugs: Secondary | ICD-10-CM | POA: Diagnosis not present

## 2021-12-12 DIAGNOSIS — M659 Synovitis and tenosynovitis, unspecified: Secondary | ICD-10-CM | POA: Diagnosis not present

## 2021-12-12 DIAGNOSIS — K5790 Diverticulosis of intestine, part unspecified, without perforation or abscess without bleeding: Secondary | ICD-10-CM | POA: Diagnosis not present

## 2021-12-12 DIAGNOSIS — N183 Chronic kidney disease, stage 3 unspecified: Secondary | ICD-10-CM | POA: Diagnosis not present

## 2021-12-12 DIAGNOSIS — E1122 Type 2 diabetes mellitus with diabetic chronic kidney disease: Secondary | ICD-10-CM | POA: Diagnosis not present

## 2021-12-12 DIAGNOSIS — F32A Depression, unspecified: Secondary | ICD-10-CM | POA: Diagnosis not present

## 2021-12-12 DIAGNOSIS — Z7952 Long term (current) use of systemic steroids: Secondary | ICD-10-CM | POA: Diagnosis not present

## 2021-12-12 DIAGNOSIS — M069 Rheumatoid arthritis, unspecified: Secondary | ICD-10-CM | POA: Diagnosis not present

## 2021-12-12 DIAGNOSIS — M779 Enthesopathy, unspecified: Secondary | ICD-10-CM | POA: Diagnosis not present

## 2021-12-12 DIAGNOSIS — E785 Hyperlipidemia, unspecified: Secondary | ICD-10-CM | POA: Diagnosis not present

## 2021-12-12 DIAGNOSIS — M25452 Effusion, left hip: Secondary | ICD-10-CM | POA: Diagnosis not present

## 2021-12-12 DIAGNOSIS — F419 Anxiety disorder, unspecified: Secondary | ICD-10-CM | POA: Diagnosis not present

## 2021-12-12 DIAGNOSIS — D631 Anemia in chronic kidney disease: Secondary | ICD-10-CM | POA: Diagnosis not present

## 2021-12-12 DIAGNOSIS — I493 Ventricular premature depolarization: Secondary | ICD-10-CM | POA: Diagnosis not present

## 2021-12-12 DIAGNOSIS — Z96651 Presence of right artificial knee joint: Secondary | ICD-10-CM | POA: Diagnosis not present

## 2021-12-12 DIAGNOSIS — K559 Vascular disorder of intestine, unspecified: Secondary | ICD-10-CM | POA: Diagnosis not present

## 2021-12-12 DIAGNOSIS — M11252 Other chondrocalcinosis, left hip: Secondary | ICD-10-CM | POA: Diagnosis not present

## 2021-12-12 DIAGNOSIS — Z9181 History of falling: Secondary | ICD-10-CM | POA: Diagnosis not present

## 2021-12-12 DIAGNOSIS — I129 Hypertensive chronic kidney disease with stage 1 through stage 4 chronic kidney disease, or unspecified chronic kidney disease: Secondary | ICD-10-CM | POA: Diagnosis not present

## 2021-12-12 DIAGNOSIS — M1612 Unilateral primary osteoarthritis, left hip: Secondary | ICD-10-CM | POA: Diagnosis not present

## 2021-12-14 ENCOUNTER — Telehealth: Payer: Self-pay

## 2021-12-14 DIAGNOSIS — M25452 Effusion, left hip: Secondary | ICD-10-CM | POA: Diagnosis not present

## 2021-12-14 DIAGNOSIS — M48061 Spinal stenosis, lumbar region without neurogenic claudication: Secondary | ICD-10-CM | POA: Diagnosis not present

## 2021-12-14 DIAGNOSIS — M11252 Other chondrocalcinosis, left hip: Secondary | ICD-10-CM | POA: Diagnosis not present

## 2021-12-14 DIAGNOSIS — M659 Synovitis and tenosynovitis, unspecified: Secondary | ICD-10-CM | POA: Diagnosis not present

## 2021-12-14 DIAGNOSIS — M1612 Unilateral primary osteoarthritis, left hip: Secondary | ICD-10-CM | POA: Diagnosis not present

## 2021-12-14 DIAGNOSIS — I129 Hypertensive chronic kidney disease with stage 1 through stage 4 chronic kidney disease, or unspecified chronic kidney disease: Secondary | ICD-10-CM | POA: Diagnosis not present

## 2021-12-14 NOTE — Telephone Encounter (Signed)
Triage phone call for HHPT pt is s/p a left hip scope calling to request orders 2 wk 4 and 1 wk 3 also asking for OT eval and treat for ADL retraining and bathroom safety. Can call Tressia Danas [phone] ok to LM ON VM it is a secure line

## 2021-12-17 ENCOUNTER — Other Ambulatory Visit (HOSPITAL_COMMUNITY): Payer: Self-pay | Admitting: *Deleted

## 2021-12-17 DIAGNOSIS — E1122 Type 2 diabetes mellitus with diabetic chronic kidney disease: Secondary | ICD-10-CM | POA: Diagnosis not present

## 2021-12-17 DIAGNOSIS — I129 Hypertensive chronic kidney disease with stage 1 through stage 4 chronic kidney disease, or unspecified chronic kidney disease: Secondary | ICD-10-CM | POA: Diagnosis not present

## 2021-12-17 DIAGNOSIS — N182 Chronic kidney disease, stage 2 (mild): Secondary | ICD-10-CM | POA: Diagnosis not present

## 2021-12-17 DIAGNOSIS — E782 Mixed hyperlipidemia: Secondary | ICD-10-CM | POA: Diagnosis not present

## 2021-12-18 ENCOUNTER — Encounter (HOSPITAL_COMMUNITY)
Admission: RE | Admit: 2021-12-18 | Discharge: 2021-12-18 | Disposition: A | Discharge status: Home / Self Care | Payer: Medicare Other | Source: Ambulatory Visit | Attending: Internal Medicine | Admitting: Internal Medicine

## 2021-12-18 DIAGNOSIS — E1121 Type 2 diabetes mellitus with diabetic nephropathy: Secondary | ICD-10-CM | POA: Diagnosis not present

## 2021-12-18 DIAGNOSIS — D631 Anemia in chronic kidney disease: Secondary | ICD-10-CM | POA: Insufficient documentation

## 2021-12-18 DIAGNOSIS — E782 Mixed hyperlipidemia: Secondary | ICD-10-CM | POA: Diagnosis not present

## 2021-12-18 DIAGNOSIS — I10 Essential (primary) hypertension: Secondary | ICD-10-CM | POA: Diagnosis not present

## 2021-12-18 MED ORDER — SODIUM CHLORIDE 0.9 % IV SOLN
510.0000 mg | INTRAVENOUS | Status: DC
  Administered 2021-12-18: 510 mg via INTRAVENOUS
  Filled 2021-12-18: qty 510

## 2021-12-19 DIAGNOSIS — M1612 Unilateral primary osteoarthritis, left hip: Secondary | ICD-10-CM | POA: Diagnosis not present

## 2021-12-19 DIAGNOSIS — I129 Hypertensive chronic kidney disease with stage 1 through stage 4 chronic kidney disease, or unspecified chronic kidney disease: Secondary | ICD-10-CM | POA: Diagnosis not present

## 2021-12-19 DIAGNOSIS — M25452 Effusion, left hip: Secondary | ICD-10-CM | POA: Diagnosis not present

## 2021-12-19 DIAGNOSIS — M659 Synovitis and tenosynovitis, unspecified: Secondary | ICD-10-CM | POA: Diagnosis not present

## 2021-12-19 DIAGNOSIS — M48061 Spinal stenosis, lumbar region without neurogenic claudication: Secondary | ICD-10-CM | POA: Diagnosis not present

## 2021-12-19 DIAGNOSIS — M11252 Other chondrocalcinosis, left hip: Secondary | ICD-10-CM | POA: Diagnosis not present

## 2021-12-20 ENCOUNTER — Ambulatory Visit (INDEPENDENT_AMBULATORY_CARE_PROVIDER_SITE_OTHER): Payer: Medicare Other | Admitting: Orthopaedic Surgery

## 2021-12-20 DIAGNOSIS — I129 Hypertensive chronic kidney disease with stage 1 through stage 4 chronic kidney disease, or unspecified chronic kidney disease: Secondary | ICD-10-CM | POA: Diagnosis not present

## 2021-12-20 DIAGNOSIS — M11252 Other chondrocalcinosis, left hip: Secondary | ICD-10-CM | POA: Diagnosis not present

## 2021-12-20 DIAGNOSIS — M48061 Spinal stenosis, lumbar region without neurogenic claudication: Secondary | ICD-10-CM | POA: Diagnosis not present

## 2021-12-20 DIAGNOSIS — M009 Pyogenic arthritis, unspecified: Secondary | ICD-10-CM

## 2021-12-20 DIAGNOSIS — M25452 Effusion, left hip: Secondary | ICD-10-CM | POA: Diagnosis not present

## 2021-12-20 DIAGNOSIS — M659 Synovitis and tenosynovitis, unspecified: Secondary | ICD-10-CM | POA: Diagnosis not present

## 2021-12-20 DIAGNOSIS — M1612 Unilateral primary osteoarthritis, left hip: Secondary | ICD-10-CM | POA: Diagnosis not present

## 2021-12-20 NOTE — Progress Notes (Deleted)
NO SHOW

## 2021-12-20 NOTE — Progress Notes (Signed)
Post Operative Evaluation    Procedure/Date of Surgery: Left open hip arthroscopy and debridement 7/20  Interval History:   Today 2 weeks status post the above procedure.  Overall he is doing extremely well.  He has been compliant with anticoagulation usage.  He is now walking with just a cane.  He states that overall his anterior groin pain is dramatically better.  She is experiencing some pain and weakness about the lateral aspect of the thigh.   PMH/PSH/Family History/Social History/Meds/Allergies:    Past Medical History:  Diagnosis Date   Chronic kidney disease    Diabetes mellitus without complication (HCC)    Diverticulosis    Hyperlipidemia    Hypertension    RA (rheumatoid arthritis) ([place])    SBO (small bowel obstruction) s/p ex lap in past 12/15/2015   Past Surgical History:  Procedure Laterality Date   APPENDECTOMY     COLOSTOMY TAKEDOWN     [place]   FLEXIBLE SIGMOIDOSCOPY N/A 12/15/2015   Procedure: FLEXIBLE SIGMOIDOSCOPY;  Surgeon: Danie Binder, MD;  [facility];  Service: Endoscopy;  Laterality: N/A;   HIP ARTHROPLASTY Left 12/06/2021   Procedure: LEFT HIP ARTHROSCOPY IRRIGATION AND DEBRIDEMENT.;  Surgeon: Vanetta Mulders, MD;  Location: [place];  Service: Orthopedics;  Laterality: Left;   KNEE SURGERY Right    LEFT COLECTOMY     [place]   LYSIS OF ADHESION     SBO   Social History   Socioeconomic History   Marital status: Divorced    Spouse name: Not on file   Number of children: 3   Years of education: Not on file   Highest education level: Not on file  Occupational History   Occupation: retired  Tobacco Use   Smoking status: Never   Smokeless tobacco: Never  Vaping Use   Vaping Use: Never used  Substance and Sexual Activity   Alcohol use: Yes    Comment: occ.    Drug use: No   Sexual activity: Not on file  Other Topics Concern   Not on file  Social History Narrative   Not on file   Social  Determinants of Health   Financial Resource Strain: High Risk (04/18/2020)   Overall Financial Resource Strain (CARDIA)    Difficulty of Paying Living Expenses: Very hard  Food Insecurity: Food Insecurity Present (04/18/2020)   Hunger Vital Sign    Worried About Running Out of Food in the Last Year: Sometimes true    Ran Out of Food in the Last Year: Sometimes true  Transportation Needs: No Transportation Needs (04/18/2020)   PRAPARE - Hydrologist (Medical): No    Lack of Transportation (Non-Medical): No  Physical Activity: Insufficiently Active (04/18/2020)   Exercise Vital Sign    Days of Exercise per Week: 2 days    Minutes of Exercise per Session: 20 min  Stress: Stress Concern Present (04/18/2020)   [place]    Feeling of Stress : Rather much  Social Connections: Moderately Isolated (04/18/2020)   Social Connection and Isolation Panel [NHANES]    Frequency of Communication with Friends and Family: More than three times a week    Frequency of Social Gatherings with Friends and Family: Once a week    Attends Religious  Services: More than 4 times per year    Active Member of Clubs or Organizations: No    Attends Archivist Meetings: Never    Marital Status: Divorced   Family History  Problem Relation Age of Onset   Cancer Mother    Heart attack Father    Hypertension Sister    Hypertension Brother    Allergies  Allergen Reactions   Latex Shortness Of Breath   Irbesartan Hives    swelling    Metformin Diarrhea, Nausea And Vomiting and Swelling   Current Outpatient Medications  Medication Sig Dispense Refill   Abatacept (ORENCIA) 125 MG/ML SOSY Inject 125 mg into the skin every 30 (thirty) days.     acetaminophen (TYLENOL) 500 MG tablet Take 500 mg by mouth every 6 (six) hours as needed for moderate pain.     amLODipine (NORVASC) 10 MG tablet Take 10 mg by mouth  daily.     Ascorbic Acid (VITAMIN C) 1000 MG tablet Take 1,000 mg by mouth daily.     carvedilol (COREG) 12.5 MG tablet Take 12.5 mg by mouth 2 (two) times daily with a meal.     clonazePAM (KLONOPIN) 1 MG tablet Take 1 mg by mouth as needed for anxiety.     colchicine 0.6 MG tablet Take 1 tablet (0.6 mg total) by mouth 2 (two) times daily. 60 tablet 0   Cyanocobalamin (B-12 PO) Take 1 capsule by mouth daily.     dapagliflozin propanediol (FARXIGA) 5 MG TABS tablet Take 5 mg by mouth daily.     ergocalciferol (VITAMIN D2) 1.25 MG (50000 UT) capsule Take 50,000 Units by mouth once a week.     fluticasone (FLONASE) 50 MCG/ACT nasal spray Place 1 spray into both nostrils daily as needed for allergies.     hydrALAZINE (APRESOLINE) 25 MG tablet Take 25 mg by mouth in the morning and at bedtime.     hydrOXYzine (ATARAX/VISTARIL) 25 MG tablet Take 25 mg by mouth daily as needed for anxiety.     linagliptin (TRADJENTA) 5 MG TABS tablet Take 5 mg by mouth daily.     lovastatin (MEVACOR) 20 MG tablet Take 20 mg by mouth daily at 12 noon.     methocarbamol (ROBAXIN) 500 MG tablet Take 1 tablet (500 mg total) by mouth every 6 (six) hours as needed for muscle spasms. 30 tablet 0   Multiple Vitamin (MULTIVITAMIN WITH MINERALS) TABS tablet Take 1 tablet by mouth daily.     oxyCODONE (OXY IR/ROXICODONE) 5 MG immediate release tablet Take 1-2 tablets (5-10 mg total) by mouth every 4 (four) hours as needed for breakthrough pain ((for MODERATE breakthrough pain)). 30 tablet 0   predniSONE (DELTASONE) 50 MG tablet Take 1 daily till all completed 6 tablet 0   No current facility-administered medications for this visit.   No results found.  Review of Systems:   A ROS was performed including pertinent positives and negatives as documented in the HPI.   Musculoskeletal Exam:    There were no vitals taken for this visit.  Left hip portals are well-healed.  He is got 30 degrees of internal and external rotation  of the hip without pain.  He is active able to flex the hip to 90 degrees without pain.  He got some weakness with resisted hip abduction.  Walks with a mildly antalgic gait with a cane in the left hand  Imaging:    None  I personally reviewed and interpreted the radiographs.  Assessment:   2 weeks status post left hip arthroscopic debridement overall doing very well.  At this time he will continue to work with his home health physical therapy to strengthen up the left hip.  I will plan to see him back in 4 weeks for final check  Plan :    -Return to clinic 4 weeks for final check      I personally saw and evaluated the patient, and participated in the management and treatment plan.  Vanetta Mulders, MD Attending Physician, Orthopedic Surgery  This document was dictated using Dragon voice recognition software. A reasonable attempt at proof reading has been made to minimize errors.

## 2021-12-21 ENCOUNTER — Inpatient Hospital Stay: Payer: Medicare Other

## 2021-12-21 ENCOUNTER — Inpatient Hospital Stay: Payer: Medicare Other | Attending: Physician Assistant | Admitting: Physician Assistant

## 2021-12-21 VITALS — BP 136/66 | HR 64 | Temp 97.0°F | Resp 18 | Ht 69.0 in | Wt 153.2 lb

## 2021-12-21 DIAGNOSIS — Z8249 Family history of ischemic heart disease and other diseases of the circulatory system: Secondary | ICD-10-CM | POA: Insufficient documentation

## 2021-12-21 DIAGNOSIS — Z8546 Personal history of malignant neoplasm of prostate: Secondary | ICD-10-CM | POA: Insufficient documentation

## 2021-12-21 DIAGNOSIS — Z9049 Acquired absence of other specified parts of digestive tract: Secondary | ICD-10-CM | POA: Diagnosis not present

## 2021-12-21 DIAGNOSIS — R5383 Other fatigue: Secondary | ICD-10-CM | POA: Diagnosis not present

## 2021-12-21 DIAGNOSIS — D649 Anemia, unspecified: Secondary | ICD-10-CM | POA: Insufficient documentation

## 2021-12-21 DIAGNOSIS — Z5941 Food insecurity: Secondary | ICD-10-CM | POA: Insufficient documentation

## 2021-12-21 DIAGNOSIS — D72825 Bandemia: Secondary | ICD-10-CM

## 2021-12-21 DIAGNOSIS — Z79899 Other long term (current) drug therapy: Secondary | ICD-10-CM | POA: Diagnosis not present

## 2021-12-21 DIAGNOSIS — R634 Abnormal weight loss: Secondary | ICD-10-CM | POA: Diagnosis not present

## 2021-12-21 DIAGNOSIS — Z9079 Acquired absence of other genital organ(s): Secondary | ICD-10-CM | POA: Diagnosis not present

## 2021-12-21 DIAGNOSIS — Z5986 Financial insecurity: Secondary | ICD-10-CM | POA: Diagnosis not present

## 2021-12-21 DIAGNOSIS — C61 Malignant neoplasm of prostate: Secondary | ICD-10-CM | POA: Diagnosis not present

## 2021-12-21 DIAGNOSIS — M069 Rheumatoid arthritis, unspecified: Secondary | ICD-10-CM | POA: Diagnosis not present

## 2021-12-21 DIAGNOSIS — Z809 Family history of malignant neoplasm, unspecified: Secondary | ICD-10-CM | POA: Insufficient documentation

## 2021-12-21 DIAGNOSIS — M2550 Pain in unspecified joint: Secondary | ICD-10-CM | POA: Insufficient documentation

## 2021-12-21 DIAGNOSIS — Z7952 Long term (current) use of systemic steroids: Secondary | ICD-10-CM | POA: Diagnosis not present

## 2021-12-21 DIAGNOSIS — Z7969 Long term (current) use of other immunomodulators and immunosuppressants: Secondary | ICD-10-CM | POA: Insufficient documentation

## 2021-12-21 DIAGNOSIS — G479 Sleep disorder, unspecified: Secondary | ICD-10-CM | POA: Diagnosis not present

## 2021-12-21 DIAGNOSIS — D72829 Elevated white blood cell count, unspecified: Secondary | ICD-10-CM | POA: Diagnosis not present

## 2021-12-21 DIAGNOSIS — R197 Diarrhea, unspecified: Secondary | ICD-10-CM | POA: Insufficient documentation

## 2021-12-21 LAB — CBC WITH DIFFERENTIAL/PLATELET
Abs Immature Granulocytes: 0.35 10*3/uL — ABNORMAL HIGH (ref 0.00–0.07)
Basophils Absolute: 0 10*3/uL (ref 0.0–0.1)
Basophils Relative: 0 %
Eosinophils Absolute: 0 10*3/uL (ref 0.0–0.5)
Eosinophils Relative: 0 %
HCT: 33.3 % — ABNORMAL LOW (ref 39.0–52.0)
Hemoglobin: 10.5 g/dL — ABNORMAL LOW (ref 13.0–17.0)
Immature Granulocytes: 2 %
Lymphocytes Relative: 12 %
Lymphs Abs: 2.4 10*3/uL (ref 0.7–4.0)
MCH: 26 pg (ref 26.0–34.0)
MCHC: 31.5 g/dL (ref 30.0–36.0)
MCV: 82.4 fL (ref 80.0–100.0)
Monocytes Absolute: 1.1 10*3/uL — ABNORMAL HIGH (ref 0.1–1.0)
Monocytes Relative: 5 %
Neutro Abs: 16.3 10*3/uL — ABNORMAL HIGH (ref 1.7–7.7)
Neutrophils Relative %: 81 %
Platelets: 406 10*3/uL — ABNORMAL HIGH (ref 150–400)
RBC: 4.04 MIL/uL — ABNORMAL LOW (ref 4.22–5.81)
RDW: 18.8 % — ABNORMAL HIGH (ref 11.5–15.5)
WBC: 20.2 10*3/uL — ABNORMAL HIGH (ref 4.0–10.5)
nRBC: 0 % (ref 0.0–0.2)

## 2021-12-21 LAB — COMPREHENSIVE METABOLIC PANEL
ALT: 27 U/L (ref 0–44)
AST: 13 U/L — ABNORMAL LOW (ref 15–41)
Albumin: 4 g/dL (ref 3.5–5.0)
Alkaline Phosphatase: 62 U/L (ref 38–126)
Anion gap: 9 (ref 5–15)
BUN: 29 mg/dL — ABNORMAL HIGH (ref 8–23)
CO2: 25 mmol/L (ref 22–32)
Calcium: 9.6 mg/dL (ref 8.9–10.3)
Chloride: 104 mmol/L (ref 98–111)
Creatinine, Ser: 1.38 mg/dL — ABNORMAL HIGH (ref 0.61–1.24)
GFR, Estimated: 55 mL/min — ABNORMAL LOW (ref >60)
Glucose, Bld: 161 mg/dL — ABNORMAL HIGH (ref 70–99)
Potassium: 4.5 mmol/L (ref 3.5–5.1)
Sodium: 138 mmol/L (ref 135–145)
Total Bilirubin: 0.4 mg/dL (ref 0.3–1.2)
Total Protein: 7.6 g/dL (ref 6.5–8.1)

## 2021-12-21 LAB — RETICULOCYTES
Immature Retic Fract: 21.3 % — ABNORMAL HIGH (ref 2.3–15.9)
RBC.: 4.05 MIL/uL — ABNORMAL LOW (ref 4.22–5.81)
Retic Count, Absolute: 62.8 10*3/uL (ref 19.0–186.0)
Retic Ct Pct: 1.6 % (ref 0.4–3.1)

## 2021-12-21 LAB — FOLATE: Folate: 34.7 ng/mL (ref >5.9)

## 2021-12-21 LAB — VITAMIN B12: Vitamin B-12: 407 pg/mL (ref 180–914)

## 2021-12-21 LAB — FERRITIN: Ferritin: 549 ng/mL — ABNORMAL HIGH (ref 24–336)

## 2021-12-21 LAB — IRON AND TIBC
Iron: 102 ug/dL (ref 45–182)
Saturation Ratios: 32 % (ref 17.9–39.5)
TIBC: 317 ug/dL (ref 250–450)
UIBC: 215 ug/dL

## 2021-12-21 LAB — PSA: Prostatic Specific Antigen: 1.74 ng/mL (ref 0.00–4.00)

## 2021-12-21 LAB — LACTATE DEHYDROGENASE: LDH: 114 U/L (ref 98–192)

## 2021-12-21 NOTE — Patient Instructions (Signed)
[facility] [place] **   You were seen today by Tarri Abernethy PA-C for your follow-up visit.    LEUKOCYTOSIS: Your white blood cells have been elevated for some time, but previous testing has been negative for any signs of blood cancer.  We will continue to monitor these labs periodically, but suspect that this is likely reactive from your inflammation and steroid use.  ANEMIA: This may be related to your underlying chronic kidney disease, but we will check additional labs today to rule out other causes.  We will also check STOOL CARDS x3.  Please return these to the clinic once they have been completed.  UNINTENTIONAL WEIGHT LOSS: We will check CT abdomen/pelvis to see if we find any cause of your unintentional weight loss.  Make sure that you mentioned your your other symptoms (difficulty swallowing and abdominal pain) to your GI doctor as soon as possible.   FOLLOW-UP APPOINTMENT: Office visit in about 1 month, once labs and CT have been completed  ** Thank you for trusting me with your healthcare!  I strive to provide all of my patients with quality care at each visit.  If you receive a survey for this visit, I would be so grateful to you for taking the time to provide feedback.  Thank you in advance!  ~ Linken Mcglothen                   Dr. Derek Jack   &   Tarri Abernethy, PA-C   - - - - - - - - - - - - - - - - - -    Thank you for choosing [facility] to provide your oncology and hematology care.  To afford each patient quality time with our provider, please arrive at least 15 minutes before your scheduled appointment time.   If you have a lab appointment with the [facility] please come in thru the Main Entrance and check in at the main information desk.  You need to re-schedule your appointment should you arrive 10 or more minutes late.  We strive to give you quality  time with our providers, and arriving late affects you and other patients whose appointments are after yours.  Also, if you no show three or more times for appointments you may be dismissed from the clinic at the providers discretion.     Again, thank you for choosing [facility].  Our hope is that these requests will decrease the amount of time that you wait before being seen by our physicians.       _____________________________________________________________  Should you have questions after your visit to [facility], please contact our office at [phone] and follow the prompts.  Our office hours are 8:00 a.m. and 4:30 p.m. Monday - Friday.  Please note that voicemails left after 4:00 p.m. may not be returned until the following business day.  We are closed weekends and major holidays.  You do have access to a nurse 24-7, just call the main number to the clinic [phone] and do not press any options, hold on the line and a nurse will answer the phone.    For prescription refill requests, have your pharmacy contact our office and allow 72 hours.

## 2021-12-21 NOTE — Progress Notes (Unsigned)
[place], [place] [address]   CLINIC:  Medical Oncology/Hematology  PCP:  [name], [place] [address] [phone]   REASON FOR VISIT:  Reestablish care for leukocytosis, anemia, and unintentional weight loss  INTERVAL HISTORY:  Kevin Lopez 71 y.o. male returns to reestablish care.  He was previously seen at this clinic for leukocytosis and anemia, but was lost to follow-up after 04/18/2020.  He returns today after being referred back to [place] by his nephrologist (Dr. Posey Pronto).  At today's visit, he reports feeling fair.  He had recent hospitalization in July 2023 due to pseudogout of left hip, with initial concerns for septic joint that was ruled out during hospitalization.  LEUKOCYTOSIS: Patient has rheumatoid arthritis, currently not on any medication.  Last Maureen Chatters was in July 2023, and he is awaiting insurance approval to start Humira.  He infrequently requires steroids, most recently was on prednisone 50 mg daily until about 4 days ago due to pseudogout of the left hip.  He denies any recent infections.  No new lumps or bumps.  No new bone pain.  No fevers, chills, or night sweats.  ANEMIA: Patient denies any bright red blood per rectum, melena, or epistaxis.  He reports fatigue, that was present prior to hospitalization.  He does not take iron pill at home, but has been receiving outpatient IV iron infusions via his nephrologist.  He does not receive any EPO injections.  UNINTENTIONAL WEIGHT LOSS: Patient reports that he has lost about 20 pounds over the past 1 to 2 years.  He has lost 10 pounds in the past month.  He reports that he has a good appetite but "keeps losing weight no matter how much he eats."  He is drinking protein shake x1 each day.  He reports colonoscopy last year at outside hospital (records not available in EMR).  He reports more frequent diarrhea lately, but otherwise denies any  changes in bowel and bladder habits.  He does have some difficulty swallowing, reports that he had an esophageal stricture stretched last year, but is having recurrent symptoms again.  Over the past month, he has had intermittent epigastric pain that is worse in the morning, relieved with eating and burping.  He denies any changes in his breathing patterns.  He has history of prostate cancer and underwent partial prostatectomy in the 2000's.    He has 60% energy and 75% appetite. He endorses that he is maintaining a stable weight.   REVIEW OF SYSTEMS:  Review of Systems  Constitutional:  Positive for fatigue and unexpected weight change. Negative for appetite change, chills, diaphoresis and fever.  HENT:   Positive for trouble swallowing. Negative for lump/mass and nosebleeds.   Eyes:  Negative for eye problems.  Respiratory:  Negative for cough, hemoptysis and shortness of breath.   Cardiovascular:  Negative for chest pain, leg swelling and palpitations.  Gastrointestinal:  Positive for diarrhea. Negative for abdominal pain, blood in stool, constipation, nausea and vomiting.  Genitourinary:  Positive for frequency. Negative for hematuria.   Musculoskeletal:  Positive for arthralgias.  Skin: Negative.   Neurological:  Negative for dizziness, headaches and light-headedness.  Hematological:  Does not bruise/bleed easily.  Psychiatric/Behavioral:  Positive for sleep disturbance.      PAST MEDICAL/SURGICAL HISTORY:  Past Medical History:  Diagnosis Date   Chronic kidney disease    Diabetes mellitus without complication (HCC)    Diverticulosis    Hyperlipidemia  Hypertension    RA (rheumatoid arthritis) (HCC)    SBO (small bowel obstruction) s/p ex lap in past 12/15/2015   Past Surgical History:  Procedure Laterality Date   APPENDECTOMY     COLOSTOMY TAKEDOWN     [place]   FLEXIBLE SIGMOIDOSCOPY N/A 12/15/2015   Procedure: FLEXIBLE SIGMOIDOSCOPY;  Surgeon: Danie Binder, MD;   [facility];  Service: Endoscopy;  Laterality: N/A;   HIP ARTHROPLASTY Left 12/06/2021   Procedure: LEFT HIP ARTHROSCOPY IRRIGATION AND DEBRIDEMENT.;  Surgeon: Vanetta Mulders, MD;  Location: [facility];  Service: Orthopedics;  Laterality: Left;   KNEE SURGERY Right    LEFT COLECTOMY     [place]   LYSIS OF ADHESION     S[place]     SOCIAL HISTORY:  Social History   Socioeconomic History   Marital status: Divorced    Spouse name: Not on file   Number of children: 3   Years of education: Not on file   Highest education level: Not on file  Occupational History   Occupation: retired  Tobacco Use   Smoking status: Never   Smokeless tobacco: Never  Vaping Use   Vaping Use: Never used  Substance and Sexual Activity   Alcohol use: Yes    Comment: occ.    Drug use: No   Sexual activity: Not on file  Other Topics Concern   Not on file  Social History Narrative   Not on file   Social Determinants of Health   Financial Resource Strain: High Risk (04/18/2020)   Overall Financial Resource Strain (CARDIA)    Difficulty of Paying Living Expenses: Very hard  Food Insecurity: Food Insecurity Present (04/18/2020)   Hunger Vital Sign    Worried About Running Out of Food in the Last Year: Sometimes true    Ran Out of Food in the Last Year: Sometimes true  Transportation Needs: No Transportation Needs (04/18/2020)   PRAPARE - Hydrologist (Medical): No    Lack of Transportation (Non-Medical): No  Physical Activity: Insufficiently Active (04/18/2020)   Exercise Vital Sign    Days of Exercise per Week: 2 days    Minutes of Exercise per Session: 20 min  Stress: Stress Concern Present (04/18/2020)   [place]    Feeling of Stress : Rather much  Social Connections: Moderately Isolated (04/18/2020)   Social Connection and Isolation Panel [NHANES]    Frequency of Communication with Friends and  Family: More than three times a week    Frequency of Social Gatherings with Friends and Family: Once a week    Attends Religious Services: More than 4 times per year    Active Member of [employer] or Organizations: No    Attends Archivist Meetings: Never    Marital Status: Divorced  Human resources officer Violence: Not At Risk (04/18/2020)   Humiliation, Afraid, Rape, and Kick questionnaire    Fear of Current or Ex-Partner: No    Emotionally Abused: No    Physically Abused: No    Sexually Abused: No    FAMILY HISTORY:  Family History  Problem Relation Age of Onset   Cancer Mother    Heart attack Father    Hypertension Sister    Hypertension Brother     CURRENT MEDICATIONS:  Outpatient Encounter Medications as of 12/21/2021  Medication Sig Note   Abatacept (ORENCIA) 125 MG/ML SOSY Inject 125 mg into the skin every 30 (thirty) days.  12/06/2021: Pt is due for injection tomorrow   acetaminophen (TYLENOL) 500 MG tablet Take 500 mg by mouth every 6 (six) hours as needed for moderate pain.    amLODipine (NORVASC) 10 MG tablet Take 10 mg by mouth daily.    Ascorbic Acid (VITAMIN C) 1000 MG tablet Take 1,000 mg by mouth daily.    carvedilol (COREG) 12.5 MG tablet Take 12.5 mg by mouth 2 (two) times daily with a meal.    clonazePAM (KLONOPIN) 1 MG tablet Take 1 mg by mouth as needed for anxiety.    colchicine 0.6 MG tablet Take 1 tablet (0.6 mg total) by mouth 2 (two) times daily.    Cyanocobalamin (B-12 PO) Take 1 capsule by mouth daily.    dapagliflozin propanediol (FARXIGA) 5 MG TABS tablet Take 5 mg by mouth daily.    ergocalciferol (VITAMIN D2) 1.25 MG (50000 UT) capsule Take 50,000 Units by mouth once a week.    fluticasone (FLONASE) 50 MCG/ACT nasal spray Place 1 spray into both nostrils daily as needed for allergies.    hydrALAZINE (APRESOLINE) 25 MG tablet Take 25 mg by mouth in the morning and at bedtime.    hydrOXYzine (ATARAX/VISTARIL) 25 MG tablet Take 25 mg by mouth daily as  needed for anxiety.    linagliptin (TRADJENTA) 5 MG TABS tablet Take 5 mg by mouth daily.    lovastatin (MEVACOR) 20 MG tablet Take 20 mg by mouth daily at 12 noon.    methocarbamol (ROBAXIN) 500 MG tablet Take 1 tablet (500 mg total) by mouth every 6 (six) hours as needed for muscle spasms.    Multiple Vitamin (MULTIVITAMIN WITH MINERALS) TABS tablet Take 1 tablet by mouth daily.    oxyCODONE (OXY IR/ROXICODONE) 5 MG immediate release tablet Take 1-2 tablets (5-10 mg total) by mouth every 4 (four) hours as needed for breakthrough pain ((for MODERATE breakthrough pain)).    predniSONE (DELTASONE) 50 MG tablet Take 1 daily till all completed    No facility-administered encounter medications on file as of 12/21/2021.    ALLERGIES:  Allergies  Allergen Reactions   Latex Shortness Of Breath   Irbesartan Hives    swelling    Metformin Diarrhea, Nausea And Vomiting and Swelling     PHYSICAL EXAM:  ECOG PERFORMANCE STATUS: 1 - Symptomatic but completely ambulatory  Vitals:   12/21/21 1020  BP: 136/66  Pulse: 64  Resp: 18  Temp: (!) 97 F (36.1 C)  SpO2: 99%   Filed Weights   12/21/21 1020  Weight: 153 lb 3.5 oz (69.5 kg)   Physical Exam Constitutional:      Appearance: Normal appearance.  HENT:     Head: Normocephalic and atraumatic.     Mouth/Throat:     Mouth: Mucous membranes are moist.  Eyes:     Extraocular Movements: Extraocular movements intact.     Pupils: Pupils are equal, round, and reactive to light.  Cardiovascular:     Rate and Rhythm: Normal rate and regular rhythm.     Pulses: Normal pulses.     Heart sounds: Normal heart sounds.  Pulmonary:     Effort: Pulmonary effort is normal.     Breath sounds: Normal breath sounds.  Abdominal:     General: Bowel sounds are normal.     Palpations: Abdomen is soft.     Tenderness: There is no abdominal tenderness.  Musculoskeletal:        General: No swelling.     Right lower leg: No  edema.     Left lower leg:  No edema.     Comments: Nodules and contractures of bilateral hands secondary to rheumatoid arthritis  Lymphadenopathy:     Cervical: No cervical adenopathy.  Skin:    General: Skin is warm and dry.  Neurological:     General: No focal deficit present.     Mental Status: He is alert and oriented to person, place, and time.     Gait: Gait abnormal (Ambulates with cane).  Psychiatric:        Mood and Affect: Mood normal.        Behavior: Behavior normal.      LABORATORY DATA:  I have reviewed the labs as listed.  CBC    Component Value Date/Time   WBC 15.9 (H) 12/10/2021 0216   RBC 3.42 (L) 12/10/2021 0216   HGB 8.7 (L) 12/10/2021 0216   HGB 10.2 (L) 07/31/2021 1426   HCT 26.7 (L) 12/10/2021 0216   HCT 31.8 (L) 07/31/2021 1426   PLT 392 12/10/2021 0216   MCV 78.1 (L) 12/10/2021 0216   MCH 25.4 (L) 12/10/2021 0216   MCHC 32.6 12/10/2021 0216   RDW 17.2 (H) 12/10/2021 0216   LYMPHSABS 2.2 12/10/2021 0216   MONOABS 0.8 12/10/2021 0216   EOSABS 0.0 12/10/2021 0216   BASOSABS 0.0 12/10/2021 0216      Latest Ref Rng & Units 12/10/2021    2:16 AM 12/09/2021    7:02 AM 12/08/2021    3:43 AM  CMP  Glucose 70 - 99 mg/dL 286  167  223   BUN 8 - 23 mg/dL '27  25  30   '$ Creatinine 0.61 - 1.24 mg/dL 1.30  1.28  1.35   Sodium 135 - 145 mmol/L 135  137  135   Potassium 3.5 - 5.1 mmol/L 5.0  4.6  4.5   Chloride 98 - 111 mmol/L 109  112  108   CO2 22 - 32 mmol/L '20  19  17   '$ Calcium 8.9 - 10.3 mg/dL 8.4  8.4  8.0     DIAGNOSTIC IMAGING:  I have independently reviewed the relevant imaging and discussed with the patient.  ASSESSMENT & PLAN: 1.  Leukocytosis - Flow cytometry on 05/12/2019 did not show any monoclonal B-cell population.  Predominance of lymphocytes and nonspecific changes. - JAK2 V617F and reflex testing was negative.  BCR/ABL by FISH was negative. - CBC (12/21/2021) with WBC 20.2, ANC 16.3, monocytes 1.1, platelets 406 - PLAN: Likely reactive in the setting of  rheumatoid arthritis, chronic inflammation, and steroids.  No further work-up needed at this time, we will continue to monitor.  2.  Anemia - Likely combination anemia from CKD and relative iron deficiency - Was stool occult negative when checked in 2021 - Denies any bleeding per rectum or melena - Currently receiving IV iron in outpatient setting per nephrologist - Most recent CBC (12/21/2021) with Hgb 10.5/MCV 82.4 - Differential diagnosis favors anemia secondary to CKD, and patient may benefit from ESA therapy in the future.  However, we will rule out other nutritional deficiency or causes of anemia before starting any ESA treatment. - PLAN: We will check stool cards x3.  Anemia lab panel will be drawn today.  RTC in 1 month to discuss results.  3.  Unintentional weight loss -Unexpectedly lost about 20 pounds over the past 1 to 2 years.  He has lost 10 pounds in the past month. - Reports good appetite but "keeps losing weight  no matter how much he eats."  He is drinking protein shake x1 each day. - He reports colonoscopy last year at outside hospital (records not available in EMR).  He reports more frequent diarrhea lately, but otherwise denies any changes in bowel and bladder habits.  He does have some difficulty swallowing, reports that he had an esophageal stricture stretched last year, but is having recurrent symptoms again.  Over the past month, he has had intermittent epigastric pain that is worse in the morning, relieved with eating and burping.  He denies any changes in his breathing patterns. - He has history of prostate cancer s/p partial prostatectomy in early 2000's - PLAN: We will check CT abdomen/pelvis. - We will check PSA - Recommend follow-up with his established GI doctor for difficulty swallowing and abdominal pain.  4.  Rheumatoid arthritis - He has been on multiple immunosuppressive medications. - Was on Orencia until July 2023. - Pending approval for Humira   PLAN  SUMMARY & DISPOSITION: Stool cards x3 Labs TODAY CT abdomen/pelvis Office visit after labs and CT  All questions were answered. The patient knows to call the clinic with any problems, questions or concerns.  Medical decision making: Moderate  Time spent on visit: I spent 40 minutes counseling the patient face to face. The total time spent in the appointment was 60 minutes and more than 50% was on counseling.   Harriett Rush, PA-C  12/22/2021 8:15 AM

## 2021-12-22 ENCOUNTER — Encounter (HOSPITAL_COMMUNITY): Payer: Self-pay | Admitting: Hematology

## 2021-12-24 DIAGNOSIS — M25452 Effusion, left hip: Secondary | ICD-10-CM | POA: Diagnosis not present

## 2021-12-24 DIAGNOSIS — M1612 Unilateral primary osteoarthritis, left hip: Secondary | ICD-10-CM | POA: Diagnosis not present

## 2021-12-24 DIAGNOSIS — M48061 Spinal stenosis, lumbar region without neurogenic claudication: Secondary | ICD-10-CM | POA: Diagnosis not present

## 2021-12-24 DIAGNOSIS — M659 Synovitis and tenosynovitis, unspecified: Secondary | ICD-10-CM | POA: Diagnosis not present

## 2021-12-24 DIAGNOSIS — M11252 Other chondrocalcinosis, left hip: Secondary | ICD-10-CM | POA: Diagnosis not present

## 2021-12-24 DIAGNOSIS — I129 Hypertensive chronic kidney disease with stage 1 through stage 4 chronic kidney disease, or unspecified chronic kidney disease: Secondary | ICD-10-CM | POA: Diagnosis not present

## 2021-12-24 LAB — KAPPA/LAMBDA LIGHT CHAINS
Kappa free light chain: 17.2 mg/L (ref 3.3–19.4)
Kappa, lambda light chain ratio: 1.69 — ABNORMAL HIGH (ref 0.26–1.65)
Lambda free light chains: 10.2 mg/L (ref 5.7–26.3)

## 2021-12-24 LAB — IMMUNOFIXATION ELECTROPHORESIS
IgA: 363 mg/dL (ref 61–437)
IgG (Immunoglobin G), Serum: 887 mg/dL (ref 603–1613)
IgM (Immunoglobulin M), Srm: 84 mg/dL (ref 20–172)
Total Protein ELP: 7 g/dL (ref 6.0–8.5)

## 2021-12-24 LAB — METHYLMALONIC ACID, SERUM: Methylmalonic Acid, Quantitative: 161 nmol/L (ref 0–378)

## 2021-12-25 ENCOUNTER — Encounter (HOSPITAL_COMMUNITY)
Admission: RE | Admit: 2021-12-25 | Discharge: 2021-12-25 | Disposition: A | Discharge status: Home / Self Care | Payer: Medicare Other | Source: Ambulatory Visit | Attending: Nephrology | Admitting: Nephrology

## 2021-12-25 ENCOUNTER — Other Ambulatory Visit: Payer: Self-pay | Admitting: Physician Assistant

## 2021-12-25 DIAGNOSIS — D631 Anemia in chronic kidney disease: Secondary | ICD-10-CM | POA: Diagnosis not present

## 2021-12-25 DIAGNOSIS — D649 Anemia, unspecified: Secondary | ICD-10-CM

## 2021-12-25 LAB — PROTEIN ELECTROPHORESIS, SERUM
A/G Ratio: 1.3 (ref 0.7–1.7)
Albumin ELP: 3.7 g/dL (ref 2.9–4.4)
Alpha-1-Globulin: 0.2 g/dL (ref 0.0–0.4)
Alpha-2-Globulin: 0.8 g/dL (ref 0.4–1.0)
Beta Globulin: 1.2 g/dL (ref 0.7–1.3)
Gamma Globulin: 0.7 g/dL (ref 0.4–1.8)
Globulin, Total: 2.9 g/dL (ref 2.2–3.9)
Total Protein ELP: 6.6 g/dL (ref 6.0–8.5)

## 2021-12-25 LAB — COPPER, SERUM: Copper: 111 ug/dL (ref 69–132)

## 2021-12-25 MED ORDER — SODIUM CHLORIDE 0.9 % IV SOLN
510.0000 mg | INTRAVENOUS | Status: AC
  Administered 2021-12-25: 510 mg via INTRAVENOUS
  Filled 2021-12-25: qty 510

## 2021-12-26 DIAGNOSIS — M11252 Other chondrocalcinosis, left hip: Secondary | ICD-10-CM | POA: Diagnosis not present

## 2021-12-26 DIAGNOSIS — M25452 Effusion, left hip: Secondary | ICD-10-CM | POA: Diagnosis not present

## 2021-12-26 DIAGNOSIS — M1612 Unilateral primary osteoarthritis, left hip: Secondary | ICD-10-CM | POA: Diagnosis not present

## 2021-12-26 DIAGNOSIS — M659 Synovitis and tenosynovitis, unspecified: Secondary | ICD-10-CM | POA: Diagnosis not present

## 2021-12-26 DIAGNOSIS — I129 Hypertensive chronic kidney disease with stage 1 through stage 4 chronic kidney disease, or unspecified chronic kidney disease: Secondary | ICD-10-CM | POA: Diagnosis not present

## 2021-12-26 DIAGNOSIS — M48061 Spinal stenosis, lumbar region without neurogenic claudication: Secondary | ICD-10-CM | POA: Diagnosis not present

## 2021-12-27 ENCOUNTER — Telehealth: Payer: Self-pay

## 2021-12-27 DIAGNOSIS — M659 Synovitis and tenosynovitis, unspecified: Secondary | ICD-10-CM | POA: Diagnosis not present

## 2021-12-27 DIAGNOSIS — I129 Hypertensive chronic kidney disease with stage 1 through stage 4 chronic kidney disease, or unspecified chronic kidney disease: Secondary | ICD-10-CM | POA: Diagnosis not present

## 2021-12-27 DIAGNOSIS — M25452 Effusion, left hip: Secondary | ICD-10-CM | POA: Diagnosis not present

## 2021-12-27 DIAGNOSIS — M48061 Spinal stenosis, lumbar region without neurogenic claudication: Secondary | ICD-10-CM | POA: Diagnosis not present

## 2021-12-27 DIAGNOSIS — M11252 Other chondrocalcinosis, left hip: Secondary | ICD-10-CM | POA: Diagnosis not present

## 2021-12-27 DIAGNOSIS — M1612 Unilateral primary osteoarthritis, left hip: Secondary | ICD-10-CM | POA: Diagnosis not present

## 2021-12-27 NOTE — Telephone Encounter (Signed)
FYI-----PT Kevin Lopez called just to let you know HHPT is on hold for a week, he is going to visit his daughter

## 2021-12-28 ENCOUNTER — Other Ambulatory Visit: Payer: Self-pay

## 2021-12-28 DIAGNOSIS — D649 Anemia, unspecified: Secondary | ICD-10-CM

## 2021-12-28 DIAGNOSIS — M069 Rheumatoid arthritis, unspecified: Secondary | ICD-10-CM | POA: Diagnosis not present

## 2021-12-28 DIAGNOSIS — D72829 Elevated white blood cell count, unspecified: Secondary | ICD-10-CM | POA: Diagnosis not present

## 2021-12-28 DIAGNOSIS — R5383 Other fatigue: Secondary | ICD-10-CM | POA: Diagnosis not present

## 2021-12-28 DIAGNOSIS — R197 Diarrhea, unspecified: Secondary | ICD-10-CM | POA: Diagnosis not present

## 2021-12-28 DIAGNOSIS — R634 Abnormal weight loss: Secondary | ICD-10-CM | POA: Diagnosis not present

## 2021-12-28 LAB — OCCULT BLOOD X 1 CARD TO LAB, STOOL
Fecal Occult Bld: NEGATIVE
Fecal Occult Bld: NEGATIVE
Fecal Occult Bld: NEGATIVE

## 2022-01-18 ENCOUNTER — Encounter (HOSPITAL_BASED_OUTPATIENT_CLINIC_OR_DEPARTMENT_OTHER): Payer: Medicare Other | Admitting: Orthopaedic Surgery

## 2022-01-22 ENCOUNTER — Other Ambulatory Visit (HOSPITAL_COMMUNITY): Payer: Medicare Other

## 2022-01-29 ENCOUNTER — Encounter (HOSPITAL_COMMUNITY): Payer: Self-pay | Admitting: Hematology

## 2022-02-01 ENCOUNTER — Inpatient Hospital Stay: Payer: Medicare Other | Attending: Physician Assistant

## 2022-02-01 ENCOUNTER — Ambulatory Visit (HOSPITAL_COMMUNITY)
Admission: RE | Admit: 2022-02-01 | Discharge: 2022-02-01 | Disposition: A | Discharge status: Home / Self Care | Payer: Medicare Other | Source: Ambulatory Visit | Attending: Physician Assistant | Admitting: Physician Assistant

## 2022-02-01 ENCOUNTER — Ambulatory Visit (INDEPENDENT_AMBULATORY_CARE_PROVIDER_SITE_OTHER): Payer: Medicare Other | Admitting: Orthopaedic Surgery

## 2022-02-01 DIAGNOSIS — D649 Anemia, unspecified: Secondary | ICD-10-CM | POA: Insufficient documentation

## 2022-02-01 DIAGNOSIS — Z8546 Personal history of malignant neoplasm of prostate: Secondary | ICD-10-CM | POA: Diagnosis not present

## 2022-02-01 DIAGNOSIS — R7989 Other specified abnormal findings of blood chemistry: Secondary | ICD-10-CM | POA: Insufficient documentation

## 2022-02-01 DIAGNOSIS — R634 Abnormal weight loss: Secondary | ICD-10-CM | POA: Insufficient documentation

## 2022-02-01 DIAGNOSIS — K5730 Diverticulosis of large intestine without perforation or abscess without bleeding: Secondary | ICD-10-CM | POA: Diagnosis not present

## 2022-02-01 DIAGNOSIS — R5383 Other fatigue: Secondary | ICD-10-CM | POA: Diagnosis not present

## 2022-02-01 DIAGNOSIS — D72829 Elevated white blood cell count, unspecified: Secondary | ICD-10-CM | POA: Insufficient documentation

## 2022-02-01 DIAGNOSIS — G4700 Insomnia, unspecified: Secondary | ICD-10-CM | POA: Insufficient documentation

## 2022-02-01 DIAGNOSIS — R1013 Epigastric pain: Secondary | ICD-10-CM | POA: Diagnosis not present

## 2022-02-01 DIAGNOSIS — M069 Rheumatoid arthritis, unspecified: Secondary | ICD-10-CM | POA: Diagnosis not present

## 2022-02-01 DIAGNOSIS — M25552 Pain in left hip: Secondary | ICD-10-CM

## 2022-02-01 DIAGNOSIS — M009 Pyogenic arthritis, unspecified: Secondary | ICD-10-CM | POA: Diagnosis not present

## 2022-02-01 DIAGNOSIS — D72825 Bandemia: Secondary | ICD-10-CM

## 2022-02-01 DIAGNOSIS — Z79899 Other long term (current) drug therapy: Secondary | ICD-10-CM | POA: Diagnosis not present

## 2022-02-01 LAB — CBC WITH DIFFERENTIAL/PLATELET
Abs Immature Granulocytes: 0.04 10*3/uL (ref 0.00–0.07)
Basophils Absolute: 0.1 10*3/uL (ref 0.0–0.1)
Basophils Relative: 1 %
Eosinophils Absolute: 2 10*3/uL — ABNORMAL HIGH (ref 0.0–0.5)
Eosinophils Relative: 15 %
HCT: 34.6 % — ABNORMAL LOW (ref 39.0–52.0)
Hemoglobin: 11.3 g/dL — ABNORMAL LOW (ref 13.0–17.0)
Immature Granulocytes: 0 %
Lymphocytes Relative: 18 %
Lymphs Abs: 2.4 10*3/uL (ref 0.7–4.0)
MCH: 26.8 pg (ref 26.0–34.0)
MCHC: 32.7 g/dL (ref 30.0–36.0)
MCV: 82 fL (ref 80.0–100.0)
Monocytes Absolute: 0.3 10*3/uL (ref 0.1–1.0)
Monocytes Relative: 2 %
Neutro Abs: 8.5 10*3/uL — ABNORMAL HIGH (ref 1.7–7.7)
Neutrophils Relative %: 64 %
Platelets: 314 10*3/uL (ref 150–400)
RBC: 4.22 MIL/uL (ref 4.22–5.81)
RDW: 17.6 % — ABNORMAL HIGH (ref 11.5–15.5)
WBC: 13.3 10*3/uL — ABNORMAL HIGH (ref 4.0–10.5)
nRBC: 0 % (ref 0.0–0.2)

## 2022-02-01 LAB — IRON AND TIBC
Iron: 63 ug/dL (ref 45–182)
Saturation Ratios: 22 % (ref 17.9–39.5)
TIBC: 283 ug/dL (ref 250–450)
UIBC: 220 ug/dL

## 2022-02-01 LAB — FERRITIN: Ferritin: 500 ng/mL — ABNORMAL HIGH (ref 24–336)

## 2022-02-01 MED ORDER — TRIAMCINOLONE ACETONIDE 40 MG/ML IJ SUSP
80.0000 mg | INTRAMUSCULAR | Status: AC | PRN
  Administered 2022-02-01: 80 mg via INTRA_ARTICULAR

## 2022-02-01 MED ORDER — LIDOCAINE HCL 1 % IJ SOLN
4.0000 mL | INTRAMUSCULAR | Status: AC | PRN
  Administered 2022-02-01: 4 mL

## 2022-02-01 NOTE — Progress Notes (Signed)
Post Operative Evaluation    Procedure/Date of Surgery: Left hip arthroscopy and debridement 7/20  Interval History:   Presents today for follow-up of his left hip.  He continues to improve and is now walking without any type of cane.  He does occasionally experience tingling and soreness.  Please   PMH/PSH/Family History/Social History/Meds/Allergies:    Past Medical History:  Diagnosis Date  . Chronic kidney disease   . Diabetes mellitus without complication ([place])   . Diverticulosis   . Hyperlipidemia   . Hypertension   . RA (rheumatoid arthritis) ([place])   . SBO (small bowel obstruction) s/p ex lap in past 12/15/2015   Past Surgical History:  Procedure Laterality Date  . APPENDECTOMY    . COLOSTOMY TAKEDOWN     [place] N/A 12/15/2015   Procedure: FLEXIBLE SIGMOIDOSCOPY;  Surgeon: Danie Binder, MD;  [facility];  Service: Endoscopy;  Laterality: N/A;  . HIP ARTHROPLASTY Left 12/06/2021   Procedure: LEFT HIP ARTHROSCOPY IRRIGATION AND DEBRIDEMENT.;  Surgeon: Vanetta Mulders, MD;  Location: [facility];  Service: Orthopedics;  Laterality: Left;  . KNEE SURGERY Right   . LEFT COLECTOMY     [place]  . LYSIS OF ADHESION     [place]   Social History   Socioeconomic History  . Marital status: Divorced    Spouse name: Not on file  . Number of children: 3  . Years of education: Not on file  . Highest education level: Not on file  Occupational History  . Occupation: retired  Tobacco Use  . Smoking status: Never  . Smokeless tobacco: Never  Vaping Use  . Vaping Use: Never used  Substance and Sexual Activity  . Alcohol use: Yes    Comment: occ.   . Drug use: No  . Sexual activity: Not on file  Other Topics Concern  . Not on file  Social History Narrative  . Not on file   Social Determinants of Health   Financial Resource Strain: High Risk (04/18/2020)   Overall Financial Resource Strain (CARDIA)   .  Difficulty of Paying Living Expenses: Very hard  Food Insecurity: Food Insecurity Present (04/18/2020)   Hunger Vital Sign   . Worried About Charity fundraiser in the Last Year: Sometimes true   . Ran Out of Food in the Last Year: Sometimes true  Transportation Needs: No Transportation Needs (04/18/2020)   PRAPARE - Transportation   . Lack of Transportation (Medical): No   . Lack of Transportation (Non-Medical): No  Physical Activity: Insufficiently Active (04/18/2020)   Exercise Vital Sign   . Days of Exercise per Week: 2 days   . Minutes of Exercise per Session: 20 min  Stress: Stress Concern Present (04/18/2020)   [place]   . Feeling of Stress : Rather much  Social Connections: Moderately Isolated (04/18/2020)   Social Connection and Isolation Panel [NHANES]   . Frequency of Communication with Friends and Family: More than three times a week   . Frequency of Social Gatherings with Friends and Family: Once a week   . Attends Religious Services: More than 4 times per year   . Active Member of Clubs or Organizations: No   . Attends Archivist Meetings: Never   .  Marital Status: Divorced   Family History  Problem Relation Age of Onset  . Cancer Mother   . Heart attack Father   . Hypertension Sister   . Hypertension Brother    Allergies  Allergen Reactions  . Latex Shortness Of Breath  . Irbesartan Hives    swelling   . Metformin Diarrhea, Nausea And Vomiting and Swelling   Current Outpatient Medications  Medication Sig Dispense Refill  . Abatacept (ORENCIA) 125 MG/ML SOSY Inject 125 mg into the skin every 30 (thirty) days.    Marland Kitchen acetaminophen (TYLENOL) 500 MG tablet Take 500 mg by mouth every 6 (six) hours as needed for moderate pain.    Marland Kitchen amLODipine (NORVASC) 10 MG tablet Take 10 mg by mouth daily.    . Ascorbic Acid (VITAMIN C) 1000 MG tablet Take 1,000 mg by mouth daily.    . carvedilol  (COREG) 12.5 MG tablet Take 12.5 mg by mouth 2 (two) times daily with a meal.    . clonazePAM (KLONOPIN) 1 MG tablet Take 1 mg by mouth as needed for anxiety.    . colchicine 0.6 MG tablet Take 1 tablet (0.6 mg total) by mouth 2 (two) times daily. 60 tablet 0  . Cyanocobalamin (B-12 PO) Take 1 capsule by mouth daily.    . dapagliflozin propanediol (FARXIGA) 5 MG TABS tablet Take 5 mg by mouth daily.    . ergocalciferol (VITAMIN D2) 1.25 MG (50000 UT) capsule Take 50,000 Units by mouth once a week.    . fluticasone (FLONASE) 50 MCG/ACT nasal spray Place 1 spray into both nostrils daily as needed for allergies.    Marland Kitchen HUMIRA PEN 40 MG/0.4ML PNKT SMARTSIG:40 Milligram(s) SUB-Q Every 2 Weeks    . hydrALAZINE (APRESOLINE) 25 MG tablet Take 25 mg by mouth in the morning and at bedtime.    . hydrOXYzine (ATARAX/VISTARIL) 25 MG tablet Take 25 mg by mouth daily as needed for anxiety.    Marland Kitchen linagliptin (TRADJENTA) 5 MG TABS tablet Take 5 mg by mouth daily.    Marland Kitchen lovastatin (MEVACOR) 20 MG tablet Take 20 mg by mouth daily at 12 noon.    . methocarbamol (ROBAXIN) 500 MG tablet Take 1 tablet (500 mg total) by mouth every 6 (six) hours as needed for muscle spasms. 30 tablet 0  . Multiple Vitamin (MULTIVITAMIN WITH MINERALS) TABS tablet Take 1 tablet by mouth daily.    Marland Kitchen oxyCODONE (OXY IR/ROXICODONE) 5 MG immediate release tablet Take 1-2 tablets (5-10 mg total) by mouth every 4 (four) hours as needed for breakthrough pain ((for MODERATE breakthrough pain)). 30 tablet 0  . predniSONE (DELTASONE) 50 MG tablet Take 1 daily till all completed 6 tablet 0   No current facility-administered medications for this visit.   No results found.  Review of Systems:   A ROS was performed including pertinent positives and negatives as documented in the HPI.   Musculoskeletal Exam:    There were no vitals taken for this visit.  Left hip portals are well-healed.  He is got 30 degrees of internal and external rotation of the  hip without pain.  He is active able to flex the hip to 120. Improved hip abduction.  Walks with a mildly antalgic gait with a cane in the left hand  Imaging:    None  I personally reviewed and interpreted the radiographs.   Assessment:   6 weeks status post left hip arthroscopic debridement overall doing very well.  At today's visit he has having  some soreness consistent with postinflammatory effects from pseudogout.  To that effect I have recommended ultrasound-guided injection of the left hip with steroid.  He would like to proceed with this today.  Plan :    -Left ultrasound-guided injection performed after verbal consent obtained    Procedure Note  Patient: Kevin Lopez             Date of Birth: 1950/11/13           MRN: [id number]             Visit Date: 02/01/2022  Procedures: Visit Diagnoses: No diagnosis found.  Large Joint Inj: L hip joint on 02/01/2022 11:47 AM Indications: pain Details: 22 G 3.5 in needle, ultrasound-guided anterolateral approach  Arthrogram: No  Medications: 4 mL lidocaine 1 %; 80 mg triamcinolone acetonide 40 MG/ML Outcome: tolerated well, no immediate complications Procedure, treatment alternatives, risks and benefits explained, specific risks discussed. Consent was given by the patient. Immediately prior to procedure a time out was called to verify the correct patient, procedure, equipment, support staff and site/side marked as required. Patient was prepped and draped in the usual sterile fashion.           I personally saw and evaluated the patient, and participated in the management and treatment plan.  Vanetta Mulders, MD Attending Physician, Orthopedic Surgery  This document was dictated using Dragon voice recognition software. A reasonable attempt at proof reading has been made to minimize errors.

## 2022-02-06 ENCOUNTER — Encounter: Payer: Self-pay | Admitting: Physician Assistant

## 2022-02-06 ENCOUNTER — Inpatient Hospital Stay (HOSPITAL_BASED_OUTPATIENT_CLINIC_OR_DEPARTMENT_OTHER): Payer: Medicare Other | Admitting: Physician Assistant

## 2022-02-06 VITALS — Wt 150.0 lb

## 2022-02-06 DIAGNOSIS — R634 Abnormal weight loss: Secondary | ICD-10-CM

## 2022-02-06 DIAGNOSIS — D649 Anemia, unspecified: Secondary | ICD-10-CM

## 2022-02-06 DIAGNOSIS — D72829 Elevated white blood cell count, unspecified: Secondary | ICD-10-CM | POA: Diagnosis not present

## 2022-02-06 NOTE — Progress Notes (Addendum)
Virtual Visit via Telephone Note [facility]  I connected with Kevin Lopez  on 02/06/22 at 1:32 PM by telephone and verified that I am speaking with the correct person using two identifiers.  Location: Patient: Home Provider: [facility]   I discussed the limitations, risks, security and privacy concerns of performing an evaluation and management service by telephone and the availability of in person appointments. I also discussed with the patient that there may be a patient responsible charge related to this service. The patient expressed understanding and agreed to proceed.  REASON FOR VISIT: Follow-up for leukocytosis, anemia, and unintentional weight loss  CURRENT THERAPY: Under work-up  INTERVAL HISTORY: Kevin Lopez is contacted today for follow-up of his leukocytosis, anemia, and unintentional weight loss.  He was last seen by Tarri Abernethy PA-C on 12/21/2021.  He has not had any changes in his health since his last visit. He continues to deny any B symptoms, lymphadenopathy, or masses. Intermittent steroid use, most recently had cortisone injection last Friday (02/01/2022).  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 No bright red blood per rectum, melena, or epistaxis. Chronic fatigue is at baseline.  No pica. No iron tablets at home, but has received iron infusions in the outpatient setting via his nephrologist.  Not taking any EPO injections. Previously lost 20 pounds in the past 1 to 2 years, weight has been stable since last visit.  Patient reports that he has a good appetite but "keeps losing weight no matter how much he eats."  He is drinking protein shake x1 each day.  He reports colonoscopy last year at outside hospital (records not available in EMR). He denies any changes in his breathing patterns. He reports 25% energy with 75% appetite.    OBSERVATIONS/OBJECTIVE: Review of Systems  Constitutional:   Positive for malaise/fatigue. Negative for chills, diaphoresis, fever and weight loss.  Respiratory:  Negative for cough and shortness of breath.   Cardiovascular:  Negative for chest pain and palpitations.  Gastrointestinal:  Negative for abdominal pain, blood in stool, melena, nausea and vomiting.  Neurological:  Negative for dizziness and headaches.     PHYSICAL EXAM (per limitations of virtual telephone visit): The patient is alert and oriented x 3, exhibiting adequate mentation, good mood, and ability to speak in full sentences and execute sound judgement.   ASSESSMENT & PLAN: 1.  Leukocytosis - Flow cytometry on 05/12/2019 did not show any monoclonal B-cell population.  Predominance of lymphocytes and nonspecific changes. - JAK2 V617F and reflex testing was negative.  BCR/ABL by FISH was negative. - CBC (12/21/2021) with WBC 20.2, ANC 16.3, monocytes 1.1, platelets 406 (patient had been on oral steroids for pseudogout of hip just prior to this CBC) - Repeat CBC (02/01/2022) with improved WBC 13.3/ANC 8.5 (received MD cortisone injection prior to lab draw) - Patient has rheumatoid arthritis, follows with rheumatology - PLAN: Likely reactive in the setting of rheumatoid arthritis, chronic inflammation, and steroids.  No further work-up needed at this time, we will continue to monitor.   2.  Anemia - Likely combination anemia from CKD and relative iron deficiency - Was stool occult negative when checked in 2021.  Hemoccult negative x3 (August 2023) - Denies any bleeding per rectum or melena - Currently receiving IV iron in outpatient setting per nephrologist - Hematology panel (12/21/2021): Elevated ferritin 549, normal TIBC and saturation.  Normal B12, MMA, copper, folate.  Normal LDH and reticulocytes.  Negative MGUS/myeloma  panel. - CMP consistent with CKD stage IIIa - Most recent CBC (02/01/2022) is improved with Hgb 11.3/MCV 82.0 - Differential diagnosis favors anemia secondary to CKD  and chronic inflammatory disease - PLAN: No treatment indicated at this time.  We will continue to monitor with repeat CBC, CMP, and iron panel at follow-up visit in 4 months.  3.  Unintentional weight loss -Unexpectedly lost about 20 pounds over the past 1 to 2 years.  He has lost 10 pounds in the course of 1 month despite good appetite.  "Keeps losing weight no matter how much he eats."  He is drinking protein shake x1 each day. - He reports colonoscopy last year at outside hospital (records not available in EMR).  He reports more frequent diarrhea lately, but otherwise denies any changes in bowel and bladder habits.  He does have some difficulty swallowing, reports that he had an esophageal stricture stretched last year, but is having recurrent symptoms again.  Over the past month, he has had intermittent epigastric pain that is worse in the morning, relieved with eating and burping.  He denies any changes in his breathing patterns. - He has history of prostate cancer s/p partial prostatectomy in early 2000's. - Normal PSA (1.74) on 12/21/2021 - CT abdomen/pelvis (4/53/6468): Small umbilical hernia without any associated findings to suggest bowel obstruction, no evidence of malignant process, other incidental findings discussed in radiology report - PLAN: No obvious malignancy to explain unintentional weight loss. - We will check TSH and T4 to rule out hyperthyroidism.  (Patient also reports insomnia, which is new for him) - Recommend follow-up with his established GI doctor for difficulty swallowing and abdominal pain. - We will place referral to nutritionist per patient request   4.  Rheumatoid arthritis - He has been on multiple immunosuppressive medications. - Was on Orencia until July 2023. - Pending approval for Humira   FOLLOW UP INSTRUCTIONS: Labs this week (TSH + T4) Labs in 4 months (CBC + CMP + LDH + ferritin + iron/TIBC + ESR + CRP) Referral to dietitian (weight loss) Office  visit in 4 months, 1 week after labs    I discussed the assessment and treatment plan with the patient. The patient was provided an opportunity to ask questions and all were answered. The patient agreed with the plan and demonstrated an understanding of the instructions.   The patient was advised to call back or seek an in-person evaluation if the symptoms worsen or if the condition fails to improve as anticipated.  I provided 22 minutes of non-face-to-face time during this encounter.   Harriett Rush, PA-C 02/08/2022 8:10 PM

## 2022-02-08 ENCOUNTER — Inpatient Hospital Stay: Payer: Medicare Other | Admitting: Physician Assistant

## 2022-02-08 ENCOUNTER — Encounter (HOSPITAL_COMMUNITY): Payer: Self-pay | Admitting: Hematology

## 2022-02-11 ENCOUNTER — Inpatient Hospital Stay: Payer: Medicare Other

## 2022-02-11 ENCOUNTER — Encounter: Payer: Medicare Other | Admitting: Dietician

## 2022-02-16 DIAGNOSIS — N182 Chronic kidney disease, stage 2 (mild): Secondary | ICD-10-CM | POA: Diagnosis not present

## 2022-02-16 DIAGNOSIS — E782 Mixed hyperlipidemia: Secondary | ICD-10-CM | POA: Diagnosis not present

## 2022-02-16 DIAGNOSIS — E1122 Type 2 diabetes mellitus with diabetic chronic kidney disease: Secondary | ICD-10-CM | POA: Diagnosis not present

## 2022-02-16 DIAGNOSIS — I129 Hypertensive chronic kidney disease with stage 1 through stage 4 chronic kidney disease, or unspecified chronic kidney disease: Secondary | ICD-10-CM | POA: Diagnosis not present

## 2022-03-07 DIAGNOSIS — N529 Male erectile dysfunction, unspecified: Secondary | ICD-10-CM | POA: Diagnosis not present

## 2022-03-07 DIAGNOSIS — R3915 Urgency of urination: Secondary | ICD-10-CM | POA: Diagnosis not present

## 2022-03-07 DIAGNOSIS — N500 Atrophy of testis: Secondary | ICD-10-CM | POA: Diagnosis not present

## 2022-03-07 DIAGNOSIS — R351 Nocturia: Secondary | ICD-10-CM | POA: Diagnosis not present

## 2022-03-07 DIAGNOSIS — R350 Frequency of micturition: Secondary | ICD-10-CM | POA: Diagnosis not present

## 2022-03-07 DIAGNOSIS — E291 Testicular hypofunction: Secondary | ICD-10-CM | POA: Diagnosis not present

## 2022-03-07 DIAGNOSIS — Z125 Encounter for screening for malignant neoplasm of prostate: Secondary | ICD-10-CM | POA: Diagnosis not present

## 2022-03-07 DIAGNOSIS — N401 Enlarged prostate with lower urinary tract symptoms: Secondary | ICD-10-CM | POA: Diagnosis not present

## 2022-03-07 DIAGNOSIS — R972 Elevated prostate specific antigen [PSA]: Secondary | ICD-10-CM | POA: Diagnosis not present

## 2022-03-07 DIAGNOSIS — N35919 Unspecified urethral stricture, male, unspecified site: Secondary | ICD-10-CM | POA: Diagnosis not present

## 2022-03-07 DIAGNOSIS — K610 Anal abscess: Secondary | ICD-10-CM | POA: Diagnosis not present

## 2022-03-07 DIAGNOSIS — R7309 Other abnormal glucose: Secondary | ICD-10-CM | POA: Diagnosis not present

## 2022-03-19 ENCOUNTER — Encounter (HOSPITAL_COMMUNITY): Payer: Self-pay | Admitting: Hematology

## 2022-03-19 DIAGNOSIS — N182 Chronic kidney disease, stage 2 (mild): Secondary | ICD-10-CM | POA: Diagnosis not present

## 2022-03-19 DIAGNOSIS — E1122 Type 2 diabetes mellitus with diabetic chronic kidney disease: Secondary | ICD-10-CM | POA: Diagnosis not present

## 2022-03-19 DIAGNOSIS — E782 Mixed hyperlipidemia: Secondary | ICD-10-CM | POA: Diagnosis not present

## 2022-03-19 DIAGNOSIS — I129 Hypertensive chronic kidney disease with stage 1 through stage 4 chronic kidney disease, or unspecified chronic kidney disease: Secondary | ICD-10-CM | POA: Diagnosis not present

## 2022-04-01 DIAGNOSIS — H1045 Other chronic allergic conjunctivitis: Secondary | ICD-10-CM | POA: Diagnosis not present

## 2022-04-01 DIAGNOSIS — L732 Hidradenitis suppurativa: Secondary | ICD-10-CM | POA: Diagnosis not present

## 2022-04-01 DIAGNOSIS — Z789 Other specified health status: Secondary | ICD-10-CM | POA: Diagnosis not present

## 2022-04-01 DIAGNOSIS — L309 Dermatitis, unspecified: Secondary | ICD-10-CM | POA: Diagnosis not present

## 2022-04-29 DIAGNOSIS — M542 Cervicalgia: Secondary | ICD-10-CM | POA: Diagnosis not present

## 2022-04-29 DIAGNOSIS — L4050 Arthropathic psoriasis, unspecified: Secondary | ICD-10-CM | POA: Diagnosis not present

## 2022-04-29 DIAGNOSIS — M5136 Other intervertebral disc degeneration, lumbar region: Secondary | ICD-10-CM | POA: Diagnosis not present

## 2022-04-29 DIAGNOSIS — M1120 Other chondrocalcinosis, unspecified site: Secondary | ICD-10-CM | POA: Diagnosis not present

## 2022-04-29 DIAGNOSIS — M0579 Rheumatoid arthritis with rheumatoid factor of multiple sites without organ or systems involvement: Secondary | ICD-10-CM | POA: Diagnosis not present

## 2022-04-29 DIAGNOSIS — N289 Disorder of kidney and ureter, unspecified: Secondary | ICD-10-CM | POA: Diagnosis not present

## 2022-04-29 DIAGNOSIS — M109 Gout, unspecified: Secondary | ICD-10-CM | POA: Diagnosis not present

## 2022-04-29 DIAGNOSIS — Z79899 Other long term (current) drug therapy: Secondary | ICD-10-CM | POA: Diagnosis not present

## 2022-04-30 DIAGNOSIS — N183 Chronic kidney disease, stage 3 unspecified: Secondary | ICD-10-CM | POA: Diagnosis not present

## 2022-04-30 DIAGNOSIS — N2581 Secondary hyperparathyroidism of renal origin: Secondary | ICD-10-CM | POA: Diagnosis not present

## 2022-05-02 DIAGNOSIS — N183 Chronic kidney disease, stage 3 unspecified: Secondary | ICD-10-CM | POA: Diagnosis not present

## 2022-05-02 DIAGNOSIS — I129 Hypertensive chronic kidney disease with stage 1 through stage 4 chronic kidney disease, or unspecified chronic kidney disease: Secondary | ICD-10-CM | POA: Diagnosis not present

## 2022-05-02 DIAGNOSIS — N2581 Secondary hyperparathyroidism of renal origin: Secondary | ICD-10-CM | POA: Diagnosis not present

## 2022-05-02 DIAGNOSIS — D631 Anemia in chronic kidney disease: Secondary | ICD-10-CM | POA: Diagnosis not present

## 2022-05-03 DIAGNOSIS — Z789 Other specified health status: Secondary | ICD-10-CM | POA: Diagnosis not present

## 2022-05-03 DIAGNOSIS — L732 Hidradenitis suppurativa: Secondary | ICD-10-CM | POA: Diagnosis not present

## 2022-05-06 DIAGNOSIS — K219 Gastro-esophageal reflux disease without esophagitis: Secondary | ICD-10-CM | POA: Diagnosis not present

## 2022-05-06 DIAGNOSIS — J309 Allergic rhinitis, unspecified: Secondary | ICD-10-CM | POA: Diagnosis not present

## 2022-05-28 DIAGNOSIS — M1120 Other chondrocalcinosis, unspecified site: Secondary | ICD-10-CM | POA: Diagnosis not present

## 2022-05-28 DIAGNOSIS — R21 Rash and other nonspecific skin eruption: Secondary | ICD-10-CM | POA: Diagnosis not present

## 2022-05-28 DIAGNOSIS — E1122 Type 2 diabetes mellitus with diabetic chronic kidney disease: Secondary | ICD-10-CM | POA: Diagnosis not present

## 2022-05-28 DIAGNOSIS — N1832 Chronic kidney disease, stage 3b: Secondary | ICD-10-CM | POA: Diagnosis not present

## 2022-06-03 ENCOUNTER — Encounter (HOSPITAL_COMMUNITY): Payer: Self-pay | Admitting: Hematology

## 2022-06-03 ENCOUNTER — Inpatient Hospital Stay: Payer: Medicare Other | Attending: Hematology

## 2022-06-03 DIAGNOSIS — D75839 Thrombocytosis, unspecified: Secondary | ICD-10-CM | POA: Insufficient documentation

## 2022-06-03 DIAGNOSIS — Z79899 Other long term (current) drug therapy: Secondary | ICD-10-CM | POA: Diagnosis not present

## 2022-06-03 DIAGNOSIS — M069 Rheumatoid arthritis, unspecified: Secondary | ICD-10-CM | POA: Diagnosis not present

## 2022-06-03 DIAGNOSIS — Z5941 Food insecurity: Secondary | ICD-10-CM | POA: Diagnosis not present

## 2022-06-03 DIAGNOSIS — D649 Anemia, unspecified: Secondary | ICD-10-CM | POA: Insufficient documentation

## 2022-06-03 DIAGNOSIS — K429 Umbilical hernia without obstruction or gangrene: Secondary | ICD-10-CM | POA: Insufficient documentation

## 2022-06-03 DIAGNOSIS — D72829 Elevated white blood cell count, unspecified: Secondary | ICD-10-CM | POA: Insufficient documentation

## 2022-06-03 DIAGNOSIS — R634 Abnormal weight loss: Secondary | ICD-10-CM | POA: Diagnosis not present

## 2022-06-03 DIAGNOSIS — Z5986 Financial insecurity: Secondary | ICD-10-CM | POA: Diagnosis not present

## 2022-06-03 DIAGNOSIS — R1310 Dysphagia, unspecified: Secondary | ICD-10-CM | POA: Insufficient documentation

## 2022-06-03 DIAGNOSIS — Z9049 Acquired absence of other specified parts of digestive tract: Secondary | ICD-10-CM | POA: Insufficient documentation

## 2022-06-03 DIAGNOSIS — K30 Functional dyspepsia: Secondary | ICD-10-CM | POA: Diagnosis not present

## 2022-06-03 DIAGNOSIS — Z8546 Personal history of malignant neoplasm of prostate: Secondary | ICD-10-CM | POA: Diagnosis not present

## 2022-06-03 DIAGNOSIS — E785 Hyperlipidemia, unspecified: Secondary | ICD-10-CM | POA: Diagnosis not present

## 2022-06-03 DIAGNOSIS — E119 Type 2 diabetes mellitus without complications: Secondary | ICD-10-CM | POA: Diagnosis not present

## 2022-06-03 DIAGNOSIS — Z23 Encounter for immunization: Secondary | ICD-10-CM | POA: Diagnosis not present

## 2022-06-03 DIAGNOSIS — R269 Unspecified abnormalities of gait and mobility: Secondary | ICD-10-CM | POA: Diagnosis not present

## 2022-06-03 DIAGNOSIS — E611 Iron deficiency: Secondary | ICD-10-CM | POA: Insufficient documentation

## 2022-06-03 DIAGNOSIS — R5383 Other fatigue: Secondary | ICD-10-CM | POA: Diagnosis not present

## 2022-06-03 LAB — FERRITIN: Ferritin: 644 ng/mL — ABNORMAL HIGH (ref 24–336)

## 2022-06-03 LAB — COMPREHENSIVE METABOLIC PANEL
ALT: 21 U/L (ref 0–44)
AST: 21 U/L (ref 15–41)
Albumin: 4.1 g/dL (ref 3.5–5.0)
Alkaline Phosphatase: 47 U/L (ref 38–126)
Anion gap: 8 (ref 5–15)
BUN: 33 mg/dL — ABNORMAL HIGH (ref 8–23)
CO2: 24 mmol/L (ref 22–32)
Calcium: 9 mg/dL (ref 8.9–10.3)
Chloride: 102 mmol/L (ref 98–111)
Creatinine, Ser: 1.84 mg/dL — ABNORMAL HIGH (ref 0.61–1.24)
GFR, Estimated: 39 mL/min — ABNORMAL LOW (ref >60)
Glucose, Bld: 137 mg/dL — ABNORMAL HIGH (ref 70–99)
Potassium: 4.5 mmol/L (ref 3.5–5.1)
Sodium: 134 mmol/L — ABNORMAL LOW (ref 135–145)
Total Bilirubin: 0.4 mg/dL (ref 0.3–1.2)
Total Protein: 7.6 g/dL (ref 6.5–8.1)

## 2022-06-03 LAB — C-REACTIVE PROTEIN: CRP: 0.7 mg/dL (ref <1.0)

## 2022-06-03 LAB — CBC WITH DIFFERENTIAL/PLATELET
Abs Immature Granulocytes: 0.15 10*3/uL — ABNORMAL HIGH (ref 0.00–0.07)
Basophils Absolute: 0.1 10*3/uL (ref 0.0–0.1)
Basophils Relative: 1 %
Eosinophils Absolute: 0.6 10*3/uL — ABNORMAL HIGH (ref 0.0–0.5)
Eosinophils Relative: 4 %
HCT: 37.9 % — ABNORMAL LOW (ref 39.0–52.0)
Hemoglobin: 12.1 g/dL — ABNORMAL LOW (ref 13.0–17.0)
Immature Granulocytes: 1 %
Lymphocytes Relative: 32 %
Lymphs Abs: 4.4 10*3/uL — ABNORMAL HIGH (ref 0.7–4.0)
MCH: 26.7 pg (ref 26.0–34.0)
MCHC: 31.9 g/dL (ref 30.0–36.0)
MCV: 83.7 fL (ref 80.0–100.0)
Monocytes Absolute: 0.7 10*3/uL (ref 0.1–1.0)
Monocytes Relative: 5 %
Neutro Abs: 8 10*3/uL — ABNORMAL HIGH (ref 1.7–7.7)
Neutrophils Relative %: 57 %
Platelets: 422 10*3/uL — ABNORMAL HIGH (ref 150–400)
RBC: 4.53 MIL/uL (ref 4.22–5.81)
RDW: 14.3 % (ref 11.5–15.5)
WBC: 14 10*3/uL — ABNORMAL HIGH (ref 4.0–10.5)
nRBC: 0 % (ref 0.0–0.2)

## 2022-06-03 LAB — SEDIMENTATION RATE: Sed Rate: 33 mm/hr — ABNORMAL HIGH (ref 0–16)

## 2022-06-03 LAB — LACTATE DEHYDROGENASE: LDH: 141 U/L (ref 98–192)

## 2022-06-03 LAB — IRON AND TIBC
Iron: 85 ug/dL (ref 45–182)
Saturation Ratios: 32 % (ref 17.9–39.5)
TIBC: 267 ug/dL (ref 250–450)
UIBC: 182 ug/dL

## 2022-06-04 DIAGNOSIS — E1121 Type 2 diabetes mellitus with diabetic nephropathy: Secondary | ICD-10-CM | POA: Diagnosis not present

## 2022-06-04 DIAGNOSIS — M0579 Rheumatoid arthritis with rheumatoid factor of multiple sites without organ or systems involvement: Secondary | ICD-10-CM | POA: Diagnosis not present

## 2022-06-04 DIAGNOSIS — E782 Mixed hyperlipidemia: Secondary | ICD-10-CM | POA: Diagnosis not present

## 2022-06-04 DIAGNOSIS — I10 Essential (primary) hypertension: Secondary | ICD-10-CM | POA: Diagnosis not present

## 2022-06-04 DIAGNOSIS — L4050 Arthropathic psoriasis, unspecified: Secondary | ICD-10-CM | POA: Diagnosis not present

## 2022-06-04 DIAGNOSIS — R29818 Other symptoms and signs involving the nervous system: Secondary | ICD-10-CM | POA: Diagnosis not present

## 2022-06-04 DIAGNOSIS — R6889 Other general symptoms and signs: Secondary | ICD-10-CM | POA: Diagnosis not present

## 2022-06-04 DIAGNOSIS — N1832 Chronic kidney disease, stage 3b: Secondary | ICD-10-CM | POA: Diagnosis not present

## 2022-06-09 NOTE — Progress Notes (Deleted)
[address] [place], [place] [address]   CLINIC:  Medical Oncology/Hematology  PCP:  [name], [place] [address] [phone]   REASON FOR VISIT:  Follow-up for leukocytosis, anemia, unintentional weight loss.    INTERVAL HISTORY:   Kevin Lopez 72 y.o. male returns for routine follow-up of leukocytosis, anemia, and unintentional weight loss.  He was last evaluated via telemedicine visit by Tarri Abernethy PA-C on 02/06/2022.  At today's visit, he reports feeling ***.  No recent hospitalizations, surgeries, or changes in baseline health status.  ***He continues to deny any B symptoms, lymphadenopathy, or masses. ***Intermittent steroid use, most recently had cortisone injection last Friday (02/01/2022); finished prednisone taper for pseudogout of his hip on 12/18/2021. ***No bright red blood per rectum, melena, or epistaxis. ***Chronic fatigue is at baseline.  No pica. ***No iron tablets at home, but has received iron infusions in the outpatient setting via his nephrologist.  Not taking any EPO injections. ***Previously lost 20 pounds in the past 1 to 2 years, weight has been stable since last visit.  Patient reports that he has a good appetite but "keeps losing weight no matter how much he eats."  He is drinking protein shake x1 each day. ***He denies any changes in his bowel, bladder, or breathing patterns. He has ***% energy and ***% appetite.  ASSESSMENT & PLAN:  1.  Leukocytosis and thrombocytosis - Flow cytometry on 05/12/2019 did not show any monoclonal B-cell population.  Predominance of lymphocytes and nonspecific changes. - JAK2 V617F and reflex testing was negative.  BCR/ABL by FISH was negative. - CBC (12/21/2021) with WBC 20.2, ANC 16.3, monocytes 1.1, platelets 406 (patient had been on oral steroids for pseudogout of hip just prior to this CBC) - Repeat CBC (02/01/2022) with improved WBC  13.3/ANC 8.5 (received MD cortisone injection prior to lab draw) - Most recent CBC (06/03/2022): WBC 14.0, lymphocytes 4.4, eosinophils 0.6, platelets 422.  Inflammatory markers elevated (ESR 33, but normal CRP 0.7) - Patient has rheumatoid arthritis, follows with rheumatology - DIFFERENTIAL DIAGNOSIS favors reactive leukocytosis in the setting of rheumatoid arthritis, chronic inflammation, and steroids. - PLAN:***No further work-up needed at this time, we will continue to monitor.   2.  Normocytic anemia - Likely combination anemia from CKD and relative iron deficiency - Was stool occult negative when checked in 2021.  Hemoccult negative x3 (August 2023) - Denies any bleeding per rectum or melena*** - Currently receiving IV iron in outpatient setting per nephrologist*** - Hematology panel (12/21/2021): Elevated ferritin 549, normal TIBC and saturation.  Normal B12, MMA, copper, folate.  Normal LDH and reticulocytes.  Negative MGUS/myeloma panel. - CMP consistent with CKD stage IIIa - Most recent labs (06/03/2022): Hgb 12.1/MCV 83.7, ferritin 644, iron saturation 32%.  Creatinine 1.84/GFR 39. - DIFFERENTIAL DIAGNOSIS favors anemia secondary to CKD and chronic inflammatory disease - PLAN: No treatment indicated at this time.  We will continue to monitor with repeat CBC, CMP, and iron panel at follow-up visit in 6 months.***   3.  Unintentional weight loss -Unexpectedly lost about 20 pounds over the past 1 to 2 years.  He has lost 10 pounds in the course of 1 month despite good appetite.  "Keeps losing weight no matter how much he eats."  He is drinking protein shake x1 each day. - He reports colonoscopy last year at outside hospital (records not available in EMR).  He reports more frequent diarrhea lately, but otherwise  denies any changes in bowel and bladder habits.  He does have some difficulty swallowing, reports that he had an esophageal stricture stretched last year, but is having recurrent  symptoms again.  Over the past month, he has had intermittent epigastric pain that is worse in the morning, relieved with eating and burping.  He denies any changes in his breathing patterns. - He has history of prostate cancer s/p partial prostatectomy in early 2000's. - Normal PSA (1.74) on 12/21/2021 - CT abdomen/pelvis ([id number]): Small umbilical hernia without any associated findings to suggest bowel obstruction, no evidence of malignant process, other incidental findings discussed in radiology report - PLAN: No obvious malignancy to explain unintentional weight loss.   4.  Rheumatoid arthritis - He has been on multiple immunosuppressive medications. - Was on Orencia until July 2023. - Pending approval for Humira  PLAN SUMMARY: >> Labs (CBC/D, CMP, LDH, ferritin, iron/TIBC) in 6 months >> *** Phone versus office visit??? >> ***   [facility] [place] **   You were seen today by Tarri Abernethy PA-C for your anemia and elevated white blood cells.    ANEMIA: This is related to your chronic kidney disease and chronic inflammation from rheumatoid arthritis.  Your blood levels are currently within satisfactory range.  You do not need any additional treatment for anemia at this time.  LEUKOCYTOSIS: Your white blood cells are elevated due to chronic inflammation and intermittent steroid medications.  You do not need any additional testing or treatment for elevated white blood cells at this time.  LABS: Return in 6 months for repeat labs  FOLLOW-UP APPOINTMENT: *** visit in 6 months      REVIEW OF SYSTEMS: ***  Review of Systems - Oncology   PHYSICAL EXAM:  ECOG PERFORMANCE STATUS: {CHL ONC ECOG [id number] *** There were no vitals filed for this visit. There were no vitals filed for this visit. Physical Exam  PAST MEDICAL/SURGICAL HISTORY:  Past Medical History:  Diagnosis Date   Chronic kidney disease     Diabetes mellitus without complication (HCC)    Diverticulosis    Hyperlipidemia    Hypertension    RA (rheumatoid arthritis) ([place])    SBO (small bowel obstruction) s/p ex lap in past 12/15/2015   Past Surgical History:  Procedure Laterality Date   APPENDECTOMY     COLOSTOMY TAKEDOWN     [place]   FLEXIBLE SIGMOIDOSCOPY N/A 12/15/2015   Procedure: FLEXIBLE SIGMOIDOSCOPY;  Surgeon: Danie Binder, MD;  [facility];  Service: Endoscopy;  Laterality: N/A;   HIP ARTHROPLASTY Left 12/06/2021   Procedure: LEFT HIP ARTHROSCOPY IRRIGATION AND DEBRIDEMENT.;  Surgeon: Vanetta Mulders, MD;  Location: [facility];  Service: Orthopedics;  Laterality: Left;   KNEE SURGERY Right    LEFT COLECTOMY     [place]   LYSIS OF ADHESION     SBO    SOCIAL HISTORY:  Social History   Socioeconomic History   Marital status: Divorced    Spouse name: Not on file   Number of children: 3   Years of education: Not on file   Highest education level: Not on file  Occupational History   Occupation: retired  Tobacco Use   Smoking status: Never   Smokeless tobacco: Never  Vaping Use   Vaping Use: Never used  Substance and Sexual Activity   Alcohol use: Yes    Comment: occ.    Drug use: No   Sexual  activity: Not on file  Other Topics Concern   Not on file  Social History Narrative   Not on file   Social Determinants of Health   Financial Resource Strain: High Risk (04/18/2020)   Overall Financial Resource Strain (CARDIA)    Difficulty of Paying Living Expenses: Very hard  Food Insecurity: Food Insecurity Present (04/18/2020)   Hunger Vital Sign    Worried About Running Out of Food in the Last Year: Sometimes true    Ran Out of Food in the Last Year: Sometimes true  Transportation Needs: No Transportation Needs (04/18/2020)   PRAPARE - Hydrologist (Medical): No    Lack of Transportation (Non-Medical): No  Physical Activity: Insufficiently Active (04/18/2020)    Exercise Vital Sign    Days of Exercise per Week: 2 days    Minutes of Exercise per Session: 20 min  Stress: Stress Concern Present (04/18/2020)   [place]    Feeling of Stress : Rather much  Social Connections: Moderately Isolated (04/18/2020)   Social Connection and Isolation Panel [NHANES]    Frequency of Communication with Friends and Family: More than three times a week    Frequency of Social Gatherings with Friends and Family: Once a week    Attends Religious Services: More than 4 times per year    Active Member of [employer] or Organizations: No    Attends Archivist Meetings: Never    Marital Status: Divorced  Human resources officer Violence: Not At Risk (04/18/2020)   Humiliation, Afraid, Rape, and Kick questionnaire    Fear of Current or Ex-Partner: No    Emotionally Abused: No    Physically Abused: No    Sexually Abused: No    FAMILY HISTORY:  Family History  Problem Relation Age of Onset   Cancer Mother    Heart attack Father    Hypertension Sister    Hypertension Brother     CURRENT MEDICATIONS:  Outpatient Encounter Medications as of 06/10/2022  Medication Sig Note   Abatacept (ORENCIA) 125 MG/ML SOSY Inject 125 mg into the skin every 30 (thirty) days. 12/06/2021: Pt is due for injection tomorrow   acetaminophen (TYLENOL) 500 MG tablet Take 500 mg by mouth every 6 (six) hours as needed for moderate pain.    amLODipine (NORVASC) 10 MG tablet Take 10 mg by mouth daily.    Ascorbic Acid (VITAMIN C) 1000 MG tablet Take 1,000 mg by mouth daily.    carvedilol (COREG) 12.5 MG tablet Take 12.5 mg by mouth 2 (two) times daily with a meal.    clonazePAM (KLONOPIN) 0.5 MG tablet Take 0.5 mg by mouth at bedtime.    clonazePAM (KLONOPIN) 1 MG tablet Take 1 mg by mouth as needed for anxiety.    colchicine 0.6 MG tablet Take 1 tablet (0.6 mg total) by mouth 2 (two) times daily.    Cyanocobalamin (B-12 PO) Take 1  capsule by mouth daily.    dapagliflozin propanediol (FARXIGA) 5 MG TABS tablet Take 5 mg by mouth daily.    ergocalciferol (VITAMIN D2) 1.25 MG (50000 UT) capsule Take 50,000 Units by mouth once a week.    fluticasone (FLONASE) 50 MCG/ACT nasal spray Place 1 spray into both nostrils daily as needed for allergies.    HUMIRA PEN 40 MG/0.4ML PNKT SMARTSIG:40 Milligram(s) SUB-Q Every 2 Weeks    hydrALAZINE (APRESOLINE) 25 MG tablet Take 25 mg by mouth in the morning and at  bedtime.    hydrOXYzine (ATARAX/VISTARIL) 25 MG tablet Take 25 mg by mouth daily as needed for anxiety.    linagliptin (TRADJENTA) 5 MG TABS tablet Take 5 mg by mouth daily.    lovastatin (MEVACOR) 20 MG tablet Take 20 mg by mouth daily at 12 noon.    methocarbamol (ROBAXIN) 500 MG tablet Take 1 tablet (500 mg total) by mouth every 6 (six) hours as needed for muscle spasms.    Multiple Vitamin (MULTIVITAMIN WITH MINERALS) TABS tablet Take 1 tablet by mouth daily.    oxyCODONE (OXY IR/ROXICODONE) 5 MG immediate release tablet Take 1-2 tablets (5-10 mg total) by mouth every 4 (four) hours as needed for breakthrough pain ((for MODERATE breakthrough pain)).    predniSONE (DELTASONE) 50 MG tablet Take 1 daily till all completed    traZODone (DESYREL) 50 MG tablet Take 50-100 mg by mouth at bedtime as needed.    No facility-administered encounter medications on file as of 06/10/2022.    ALLERGIES:  Allergies  Allergen Reactions   Latex Shortness Of Breath   Irbesartan Hives    swelling    Metformin Diarrhea, Nausea And Vomiting and Swelling    LABORATORY DATA:  I have reviewed the labs as listed.  CBC    Component Value Date/Time   WBC 14.0 (H) 06/03/2022 1313   RBC 4.53 06/03/2022 1313   HGB 12.1 (L) 06/03/2022 1313   HGB 10.2 (L) 07/31/2021 1426   HCT 37.9 (L) 06/03/2022 1313   HCT 31.8 (L) 07/31/2021 1426   PLT 422 (H) 06/03/2022 1313   MCV 83.7 06/03/2022 1313   MCH 26.7 06/03/2022 1313   MCHC 31.9 06/03/2022  1313   RDW 14.3 06/03/2022 1313   LYMPHSABS 4.4 (H) 06/03/2022 1313   MONOABS 0.7 06/03/2022 1313   EOSABS 0.6 (H) 06/03/2022 1313   BASOSABS 0.1 06/03/2022 1313      Latest Ref Rng & Units 06/03/2022    1:13 PM 12/21/2021   12:13 PM 12/10/2021    2:16 AM  CMP  Glucose 70 - 99 mg/dL 137  161  286   BUN 8 - 23 mg/dL 33  29  27   Creatinine 0.61 - 1.24 mg/dL 1.84  1.38  1.30   Sodium 135 - 145 mmol/L 134  138  135   Potassium 3.5 - 5.1 mmol/L 4.5  4.5  5.0   Chloride 98 - 111 mmol/L 102  104  109   CO2 22 - 32 mmol/L 24  25  20   $ Calcium 8.9 - 10.3 mg/dL 9.0  9.6  8.4   Total Protein 6.5 - 8.1 g/dL 7.6  7.6    Total Bilirubin 0.3 - 1.2 mg/dL 0.4  0.4    Alkaline Phos 38 - 126 U/L 47  62    AST 15 - 41 U/L 21  13    ALT 0 - 44 U/L 21  27      DIAGNOSTIC IMAGING:  I have independently reviewed the relevant imaging and discussed with the patient.   WRAP UP:  All questions were answered. The patient knows to call the clinic with any problems, questions or concerns.  Medical decision making: ***  Time spent on visit: I spent *** minutes counseling the patient face to face. The total time spent in the appointment was *** minutes and more than 50% was on counseling.  Harriett Rush, PA-C  ***

## 2022-06-10 ENCOUNTER — Ambulatory Visit: Payer: Medicare Other | Admitting: Physician Assistant

## 2022-06-10 NOTE — Progress Notes (Signed)
Kevin Lopez, [place] [address]   CLINIC:  Medical Oncology/Hematology  PCP:  Kevin Lopez, [place] [address] [phone]   REASON FOR VISIT:  Follow-up for leukocytosis, anemia, unintentional weight loss.     INTERVAL HISTORY:   Kevin Lopez 72 y.o. male returns for routine follow-up of leukocytosis, anemia, and unintentional weight loss.  He was last evaluated via telemedicine visit by Tarri Abernethy PA-C on 02/06/2022.   At today's visit, he reports feeling fairly well.  No recent hospitalizations, surgeries, or changes in baseline health status.   He continues to deny any B symptoms, lymphadenopathy, or masses.  Intermittent steroid use, most recently had cortisone injection about 3 weeks ago. 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  No bright red blood per rectum, melena, or epistaxis.  Chronic fatigue is at baseline.  No pica.  No iron tablets at home, but has previously received iron infusions in the outpatient setting via his nephrologist.  Not taking any EPO injections.  He has not lost any weight since his last visit, but is frustrated that he cannot gain weight "no matter how much he eats."  He reports intermittent indigestion and chest tightening with eating.  No changes in bowel, bladder, or breathing patterns. He has 75% energy and 70% appetite.   ASSESSMENT & PLAN:  1.  Leukocytosis and thrombocytosis - Flow cytometry on 05/12/2019 did not show any monoclonal B-cell population.  Predominance of lymphocytes and nonspecific changes. - JAK2 V617F and reflex testing was negative.  BCR/ABL by FISH was negative. - CBC (12/21/2021) with WBC 20.2, ANC 16.3, monocytes 1.1, platelets 406 (patient had been on oral steroids for pseudogout of hip just prior to this CBC) - Repeat CBC (02/01/2022) with improved WBC 13.3/ANC 8.5 (received MD cortisone injection prior to lab draw) -  Most recent CBC (06/03/2022): WBC 14.0, lymphocytes 4.4, eosinophils 0.6, platelets 422.  Inflammatory markers elevated (ESR 33, but normal CRP 0.7) - Patient has rheumatoid arthritis, follows with rheumatology - DIFFERENTIAL DIAGNOSIS favors reactive leukocytosis in the setting of rheumatoid arthritis, chronic inflammation, and steroids. - PLAN:  No further work-up needed at this time, we will continue to monitor.   2.  Normocytic anemia - Likely combination anemia from CKD and relative iron deficiency - Was stool occult negative when checked in 2021.  Hemoccult negative x3 (August 2023) - Denies any bleeding per rectum or melena - Currently receiving IV iron in outpatient setting per nephrologist - Hematology panel (12/21/2021): Elevated ferritin 549, normal TIBC and saturation.  Normal B12, MMA, copper, folate.  Normal LDH and reticulocytes.  Negative MGUS/myeloma panel. - CMP consistent with CKD stage IIIa - Most recent labs (06/03/2022): Hgb 12.1/MCV 83.7, ferritin 644, iron saturation 32%.  Creatinine 1.84/GFR 39. - DIFFERENTIAL DIAGNOSIS favors anemia secondary to CKD and chronic inflammatory disease - PLAN: No treatment indicated at this time.  We will continue to monitor with repeat CBC, CMP, and iron panel at follow-up visit in 6 months.   3.  Unintentional weight loss - Unexpectedly lost about 20 pounds over the past 1 to 2 years - "Keeps losing weight no matter how much he eats."  He is drinking protein shake x1 each day. - He reports colonoscopy last year at outside hospital (records not available in EMR).  He reports more frequent diarrhea lately, but otherwise denies any changes in bowel and bladder habits.  He does have some difficulty  swallowing, reports that he had an esophageal stricture stretched last year, but is having recurrent symptoms again.  Over the past month, he has had intermittent epigastric pain that is worse in the morning, relieved with eating and burping.  He denies  any changes in his breathing patterns. - He has history of prostate cancer s/p partial prostatectomy in early 2000's. - Normal PSA (1.74) on 12/21/2021 - CT abdomen/pelvis (7/84/6962): Small umbilical hernia without any associated findings to suggest bowel obstruction, no evidence of malignant process, other incidental findings discussed in radiology report - Weight today is stable and improved compared to last visit 4 months ago - PLAN: No obvious malignancy to explain unintentional weight loss. -- Will refer to nutritionist   4.  Rheumatoid arthritis - He has been on multiple immunosuppressive medications. - Was on Orencia until July 2023. - Pending approval for Humira   PLAN SUMMARY: >> Labs TODAY - TSH and T4 >> Referral to nutritionist >> Labs (CBC/D, CMP, LDH, ferritin, iron/TIBC) in 6 months >> OFFICE visit in 6 months, 1 week after labs      REVIEW OF SYSTEMS:   Review of Systems  Constitutional:  Positive for fatigue. Negative for appetite change, chills, diaphoresis, fever and unexpected weight change.  HENT:   Negative for lump/mass and nosebleeds.   Eyes:  Negative for eye problems.  Respiratory:  Negative for cough, hemoptysis and shortness of breath.   Cardiovascular:  Negative for chest pain, leg swelling and palpitations.  Gastrointestinal:  Positive for abdominal pain (chest tightening and indigestion when he eats). Negative for blood in stool, constipation, diarrhea, nausea and vomiting.  Genitourinary:  Negative for hematuria.   Skin: Negative.   Neurological:  Negative for dizziness, headaches and light-headedness.  Hematological:  Does not bruise/bleed easily.  Psychiatric/Behavioral:  Positive for sleep disturbance. The patient is nervous/anxious.      PHYSICAL EXAM:  ECOG PERFORMANCE STATUS: 1 - Symptomatic but completely ambulatory  There were no vitals filed for this visit. There were no vitals filed for this visit. Physical Exam Constitutional:       Appearance: Normal appearance.  HENT:     Head: Normocephalic and atraumatic.     Mouth/Throat:     Mouth: Mucous membranes are moist.  Eyes:     Extraocular Movements: Extraocular movements intact.     Pupils: Pupils are equal, round, and reactive to light.  Cardiovascular:     Rate and Rhythm: Normal rate and regular rhythm.     Pulses: Normal pulses.     Heart sounds: Normal heart sounds.  Pulmonary:     Effort: Pulmonary effort is normal.     Breath sounds: Normal breath sounds.  Abdominal:     General: Bowel sounds are normal.     Palpations: Abdomen is soft.     Tenderness: There is no abdominal tenderness.  Musculoskeletal:        General: No swelling.     Right lower leg: No edema.     Left lower leg: No edema.     Comments: Nodules and contractures of bilateral hands secondary to rheumatoid arthritis  Lymphadenopathy:     Cervical: No cervical adenopathy.  Skin:    General: Skin is warm and dry.  Neurological:     General: No focal deficit present.     Mental Status: He is alert and oriented to person, place, and time.     Gait: Gait abnormal (Ambulates with cane).  Psychiatric:  Mood and Affect: Mood normal.        Behavior: Behavior normal.     PAST MEDICAL/SURGICAL HISTORY:  Past Medical History:  Diagnosis Date   Chronic kidney disease    Diabetes mellitus without complication (HCC)    Diverticulosis    Hyperlipidemia    Hypertension    RA (rheumatoid arthritis) ([place])    SBO (small bowel obstruction) s/p ex lap in past 12/15/2015   Past Surgical History:  Procedure Laterality Date   APPENDECTOMY     COLOSTOMY TAKEDOWN     [place]   FLEXIBLE SIGMOIDOSCOPY N/A 12/15/2015   Procedure: FLEXIBLE SIGMOIDOSCOPY;  Surgeon: Danie Binder, MD;  [facility];  Service: Endoscopy;  Laterality: N/A;   HIP ARTHROPLASTY Left 12/06/2021   Procedure: LEFT HIP ARTHROSCOPY IRRIGATION AND DEBRIDEMENT.;  Surgeon: Vanetta Mulders, MD;  Location: [facility];   Service: Orthopedics;  Laterality: Left;   KNEE SURGERY Right    LEFT COLECTOMY     [place]   LYSIS OF ADHESION     SBO    SOCIAL HISTORY:  Social History   Socioeconomic History   Marital status: Divorced    Spouse name: Not on file   Number of children: 3   Years of education: Not on file   Highest education level: Not on file  Occupational History   Occupation: retired  Tobacco Use   Smoking status: Never   Smokeless tobacco: Never  Vaping Use   Vaping Use: Never used  Substance and Sexual Activity   Alcohol use: Yes    Comment: occ.    Drug use: No   Sexual activity: Not on file  Other Topics Concern   Not on file  Social History Narrative   Not on file   Social Determinants of Health   Financial Resource Strain: High Risk (04/18/2020)   Overall Financial Resource Strain (CARDIA)    Difficulty of Paying Living Expenses: Very hard  Food Insecurity: Food Insecurity Present (04/18/2020)   Hunger Vital Sign    Worried About Running Out of Food in the Last Year: Sometimes true    Ran Out of Food in the Last Year: Sometimes true  Transportation Needs: No Transportation Needs (04/18/2020)   PRAPARE - Hydrologist (Medical): No    Lack of Transportation (Non-Medical): No  Physical Activity: Insufficiently Active (04/18/2020)   Exercise Vital Sign    Days of Exercise per Week: 2 days    Minutes of Exercise per Session: 20 min  Stress: Stress Concern Present (04/18/2020)   [place]    Feeling of Stress : Rather much  Social Connections: Moderately Isolated (04/18/2020)   Social Connection and Isolation Panel [NHANES]    Frequency of Communication with Friends and Family: More than three times a week    Frequency of Social Gatherings with Friends and Family: Once a week    Attends Religious Services: More than 4 times per year    Active Member of [employer] or Organizations: No     Attends Archivist Meetings: Never    Marital Status: Divorced  Human resources officer Violence: Not At Risk (04/18/2020)   Humiliation, Afraid, Rape, and Kick questionnaire    Fear of Current or Ex-Partner: No    Emotionally Abused: No    Physically Abused: No    Sexually Abused: No    FAMILY HISTORY:  Family History  Problem Relation Age of Onset  Cancer Mother    Heart attack Father    Hypertension Sister    Hypertension Brother     CURRENT MEDICATIONS:  Outpatient Encounter Medications as of 06/11/2022  Medication Sig Note   Abatacept (ORENCIA) 125 MG/ML SOSY Inject 125 mg into the skin every 30 (thirty) days. 12/06/2021: Pt is due for injection tomorrow   acetaminophen (TYLENOL) 500 MG tablet Take 500 mg by mouth every 6 (six) hours as needed for moderate pain.    amLODipine (NORVASC) 10 MG tablet Take 10 mg by mouth daily.    Ascorbic Acid (VITAMIN C) 1000 MG tablet Take 1,000 mg by mouth daily.    carvedilol (COREG) 12.5 MG tablet Take 12.5 mg by mouth 2 (two) times daily with a meal.    clonazePAM (KLONOPIN) 0.5 MG tablet Take 0.5 mg by mouth at bedtime.    clonazePAM (KLONOPIN) 1 MG tablet Take 1 mg by mouth as needed for anxiety.    colchicine 0.6 MG tablet Take 1 tablet (0.6 mg total) by mouth 2 (two) times daily.    Cyanocobalamin (B-12 PO) Take 1 capsule by mouth daily.    dapagliflozin propanediol (FARXIGA) 5 MG TABS tablet Take 5 mg by mouth daily.    ergocalciferol (VITAMIN D2) 1.25 MG (50000 UT) capsule Take 50,000 Units by mouth once a week.    fluticasone (FLONASE) 50 MCG/ACT nasal spray Place 1 spray into both nostrils daily as needed for allergies.    HUMIRA PEN 40 MG/0.4ML PNKT SMARTSIG:40 Milligram(s) SUB-Q Every 2 Weeks    hydrALAZINE (APRESOLINE) 25 MG tablet Take 25 mg by mouth in the morning and at bedtime.    hydrOXYzine (ATARAX/VISTARIL) 25 MG tablet Take 25 mg by mouth daily as needed for anxiety.    linagliptin (TRADJENTA) 5 MG TABS tablet Take  5 mg by mouth daily.    lovastatin (MEVACOR) 20 MG tablet Take 20 mg by mouth daily at 12 noon.    methocarbamol (ROBAXIN) 500 MG tablet Take 1 tablet (500 mg total) by mouth every 6 (six) hours as needed for muscle spasms.    Multiple Vitamin (MULTIVITAMIN WITH MINERALS) TABS tablet Take 1 tablet by mouth daily.    oxyCODONE (OXY IR/ROXICODONE) 5 MG immediate release tablet Take 1-2 tablets (5-10 mg total) by mouth every 4 (four) hours as needed for breakthrough pain ((for MODERATE breakthrough pain)).    predniSONE (DELTASONE) 50 MG tablet Take 1 daily till all completed    traZODone (DESYREL) 50 MG tablet Take 50-100 mg by mouth at bedtime as needed.    No facility-administered encounter medications on file as of 06/11/2022.    ALLERGIES:  Allergies  Allergen Reactions   Latex Shortness Of Breath   Irbesartan Hives    swelling    Metformin Diarrhea, Nausea And Vomiting and Swelling    LABORATORY DATA:  I have reviewed the labs as listed.  CBC    Component Value Date/Time   WBC 14.0 (H) 06/03/2022 1313   RBC 4.53 06/03/2022 1313   HGB 12.1 (L) 06/03/2022 1313   HGB 10.2 (L) 07/31/2021 1426   HCT 37.9 (L) 06/03/2022 1313   HCT 31.8 (L) 07/31/2021 1426   PLT 422 (H) 06/03/2022 1313   MCV 83.7 06/03/2022 1313   MCH 26.7 06/03/2022 1313   MCHC 31.9 06/03/2022 1313   RDW 14.3 06/03/2022 1313   LYMPHSABS 4.4 (H) 06/03/2022 1313   MONOABS 0.7 06/03/2022 1313   EOSABS 0.6 (H) 06/03/2022 1313   BASOSABS 0.1 06/03/2022 1313  Latest Ref Rng & Units 06/03/2022    1:13 PM 12/21/2021   12:13 PM 12/10/2021    2:16 AM  CMP  Glucose 70 - 99 mg/dL 137  161  286   BUN 8 - 23 mg/dL 33  29  27   Creatinine 0.61 - 1.24 mg/dL 1.84  1.38  1.30   Sodium 135 - 145 mmol/L 134  138  135   Potassium 3.5 - 5.1 mmol/L 4.5  4.5  5.0   Chloride 98 - 111 mmol/L 102  104  109   CO2 22 - 32 mmol/L '24  25  20   '$ Calcium 8.9 - 10.3 mg/dL 9.0  9.6  8.4   Total Protein 6.5 - 8.1 g/dL 7.6  7.6     Total Bilirubin 0.3 - 1.2 mg/dL 0.4  0.4    Alkaline Phos 38 - 126 U/L 47  62    AST 15 - 41 U/L 21  13    ALT 0 - 44 U/L 21  27      DIAGNOSTIC IMAGING:  I have independently reviewed the relevant imaging and discussed with the patient.   WRAP UP:  All questions were answered. The patient knows to call the clinic with any problems, questions or concerns.  Medical decision making: Moderate  Time spent on visit: I spent 20 minutes counseling the patient face to face. The total time spent in the appointment was 30 minutes and more than 50% was on counseling.  Harriett Rush, PA-C  06/11/22 11:48 AM

## 2022-06-11 ENCOUNTER — Other Ambulatory Visit: Payer: Self-pay

## 2022-06-11 ENCOUNTER — Inpatient Hospital Stay: Payer: Medicare Other

## 2022-06-11 ENCOUNTER — Inpatient Hospital Stay (HOSPITAL_BASED_OUTPATIENT_CLINIC_OR_DEPARTMENT_OTHER): Payer: Medicare Other | Admitting: Physician Assistant

## 2022-06-11 VITALS — BP 160/75 | HR 62 | Temp 97.0°F | Resp 18 | Wt 152.9 lb

## 2022-06-11 DIAGNOSIS — R634 Abnormal weight loss: Secondary | ICD-10-CM

## 2022-06-11 DIAGNOSIS — D72829 Elevated white blood cell count, unspecified: Secondary | ICD-10-CM

## 2022-06-11 DIAGNOSIS — C61 Malignant neoplasm of prostate: Secondary | ICD-10-CM

## 2022-06-11 DIAGNOSIS — D75839 Thrombocytosis, unspecified: Secondary | ICD-10-CM | POA: Diagnosis not present

## 2022-06-11 DIAGNOSIS — D72825 Bandemia: Secondary | ICD-10-CM

## 2022-06-11 DIAGNOSIS — D649 Anemia, unspecified: Secondary | ICD-10-CM | POA: Diagnosis not present

## 2022-06-11 DIAGNOSIS — Z23 Encounter for immunization: Secondary | ICD-10-CM

## 2022-06-11 DIAGNOSIS — K30 Functional dyspepsia: Secondary | ICD-10-CM | POA: Diagnosis not present

## 2022-06-11 LAB — TSH: TSH: 1.817 u[IU]/mL (ref 0.350–4.500)

## 2022-06-11 MED ORDER — INFLUENZA VAC A&B SA ADJ QUAD 0.5 ML IM PRSY
0.5000 mL | PREFILLED_SYRINGE | Freq: Once | INTRAMUSCULAR | Status: AC
  Administered 2022-06-11: 0.5 mL via INTRAMUSCULAR
  Filled 2022-06-11: qty 0.5

## 2022-06-11 NOTE — Progress Notes (Signed)
Patient tolerated injection with no complaints voiced.  Site clean and dry with no bruising or swelling noted at site.  See MAR for details.  Band aid applied.  Patient stable during and after injection.  Vss with discharge and left in satisfactory condition with no s/s of distress noted.  

## 2022-06-11 NOTE — Patient Instructions (Signed)
[facility] **    You were seen today by Tarri Abernethy PA-C for your anemia and elevated white blood cells.     ANEMIA: This is related to your chronic kidney disease and chronic inflammation from rheumatoid arthritis.  Your blood levels are currently within satisfactory range.  You do not need any additional treatment for anemia at this time.   LEUKOCYTOSIS: Your white blood cells are elevated due to chronic inflammation and intermittent steroid medications.  You do not need any additional testing or treatment for elevated white blood cells at this time.   LABS: Return in 6 months for repeat labs   FOLLOW-UP APPOINTMENT: Office visit in 6 months   ** Thank you for trusting me with your healthcare!  I strive to provide all of my patients with quality care at each visit.  If you receive a survey for this visit, I would be so grateful to you for taking the time to provide feedback.  Thank you in advance!  ~ Manya Balash                   Dr. Derek Jack   &   Tarri Abernethy, PA-C   - - - - - - - - - - - - - - - - - -    Thank you for choosing [facility] to provide your oncology and hematology care.  To afford each patient quality time with our provider, please arrive at least 15 minutes before your scheduled appointment time.   If you have a lab appointment with the [facility] please come in thru the Main Entrance and check in at the main information desk.  You need to re-schedule your appointment should you arrive 10 or more minutes late.  We strive to give you quality time with our providers, and arriving late affects you and other patients whose appointments are after yours.  Also, if you no show three or more times for appointments you may be dismissed from the clinic at the providers discretion.     Again, thank you for choosing [facility].  Our  hope is that these requests will decrease the amount of time that you wait before being seen by our physicians.       _____________________________________________________________  Should you have questions after your visit to [facility], please contact our office at [phone] and follow the prompts.  Our office hours are 8:00 a.m. and 4:30 p.m. Monday - Friday.  Please note that voicemails left after 4:00 p.m. may not be returned until the following business day.  We are closed weekends and major holidays.  You do have access to a nurse 24-7, just call the main number to the clinic [phone] and do not press any options, hold on the line and a nurse will answer the phone.    For prescription refill requests, have your pharmacy contact our office and allow 72 hours.

## 2022-06-11 NOTE — Patient Instructions (Signed)
[facility]  Discharge Instructions: Thank you for choosing [facility] to provide your oncology and hematology care.  If you have a lab appointment with the Cancer Center, please come in thru the Main Entrance and check in at the main information desk.  Wear comfortable clothing and clothing appropriate for easy access to any Portacath or PICC line.   We strive to give you quality time with your provider. You may need to reschedule your appointment if you arrive late (15 or more minutes).  Arriving late affects you and other patients whose appointments are after yours.  Also, if you miss three or more appointments without notifying the office, you may be dismissed from the clinic at the provider's discretion.      For prescription refill requests, have your pharmacy contact our office and allow 72 hours for refills to be completed.    To help prevent nausea and vomiting after your treatment, we encourage you to take your nausea medication as directed.  BELOW ARE SYMPTOMS THAT SHOULD BE REPORTED IMMEDIATELY: *FEVER GREATER THAN 100.4 F (38 C) OR HIGHER *CHILLS OR SWEATING *NAUSEA AND VOMITING THAT IS NOT CONTROLLED WITH YOUR NAUSEA MEDICATION *UNUSUAL SHORTNESS OF BREATH *UNUSUAL BRUISING OR BLEEDING *URINARY PROBLEMS (pain or burning when urinating, or frequent urination) *BOWEL PROBLEMS (unusual diarrhea, constipation, pain near the anus) TENDERNESS IN MOUTH AND THROAT WITH OR WITHOUT PRESENCE OF ULCERS (sore throat, sores in mouth, or a toothache) UNUSUAL RASH, SWELLING OR PAIN  UNUSUAL VAGINAL DISCHARGE OR ITCHING   Items with * indicate a potential emergency and should be followed up as soon as possible or go to the Emergency Department if any problems should occur.  Please show the CHEMOTHERAPY ALERT CARD or IMMUNOTHERAPY ALERT CARD at check-in to the Emergency Department and triage nurse.  Should you have questions after your visit or need to  cancel or reschedule your appointment, please contact [facility] [phone]  and follow the prompts.  Office hours are 8:00 a.m. to 4:30 p.m. Monday - Friday. Please note that voicemails left after 4:00 p.m. may not be returned until the following business day.  We are closed weekends and major holidays. You have access to a nurse at all times for urgent questions. Please call the main number to the clinic [phone] and follow the prompts.  For any non-urgent questions, you may also contact your provider using MyChart. We now offer e-Visits for anyone 18 and older to request care online for non-urgent symptoms. For details visit [email or web address].   Also download the MyChart app! Go to the app store, search "MyChart", open the app, select [facility], and log in with your MyChart username and password.   

## 2022-06-13 LAB — T4: T4, Total: 8.8 ug/dL (ref 4.5–12.0)

## 2022-06-17 ENCOUNTER — Inpatient Hospital Stay: Payer: Medicare Other | Admitting: Dietician

## 2022-06-17 ENCOUNTER — Telehealth: Payer: Self-pay | Admitting: Dietician

## 2022-06-17 NOTE — Progress Notes (Incomplete)
Nutrition Assessment   Reason for Assessment: Referral (wt gain)    ASSESSMENT: 72 year old     Nutrition Focused Physical Exam:    Medications: ***   Labs: ***   Anthropometrics:   Height: *** Weight: *** UBW: *** BMI: ***   Estimated Energy Needs  Kcals: *** Protein: *** Fluid: ***   NUTRITION DIAGNOSIS: ***   MALNUTRITION DIAGNOSIS: ***   INTERVENTION: ***   MONITORING, EVALUATION, GOAL: ***   Next Visit: ***  ***

## 2022-06-17 NOTE — Telephone Encounter (Signed)
Nutrition Assessment   Reason for Assessment: Referral    ASSESSMENT: 72 year old male with normocytic anemia, leukocytosis, unintentional weight loss.   Past medical history includes RA, CKD3, prostate cancer s/p partial prostatectomy, SBO x/p ex lap, HTN, HLD, DM2  Spoke with patient via telephone. He reports having a good appetite and eats 3 meals + snacks. Patient reports he does not each much meat. He eats some seafood, lean chicken and [place]. Patient enjoys fruits and vegetables.Patient has been unable to gain weight. He is drinking 1-2 premier protein (160 kcal, 30 g). Patient drinks ~2 L water. He is active, reports riding stationary bike 3-4 times weekly.  Nutrition Focused Physical Exam: telephone visit (unable to complete)    Medications: reviewed   Labs: reviewed    Anthropometrics:   Height: 5'9" Weight: 152 lb 14.4 oz (1/23) UBW: 168 lb (per pt) note 166 lb on 07/31/21 BMI: 22.58   NUTRITION DIAGNOSIS: Unintentional weight loss related to chronic disease as evidenced by 9% (~15 lb) decrease from reported usual weight in 10 months - insignificant for time frame  INTERVENTION:  Discussed foods with protein, encouraged protein source with all meals and snacks Suggested trying Ensure/Boost Plus for added calories vs premier protein Continue activity, suggested wt bearing exercises as able   MONITORING, EVALUATION, GOAL: Patient will tolerate increased calories and protein to promote weight gain    Next Visit: No follow-up shceduled

## 2022-06-24 DIAGNOSIS — N289 Disorder of kidney and ureter, unspecified: Secondary | ICD-10-CM | POA: Diagnosis not present

## 2022-06-24 DIAGNOSIS — M1120 Other chondrocalcinosis, unspecified site: Secondary | ICD-10-CM | POA: Diagnosis not present

## 2022-06-24 DIAGNOSIS — M109 Gout, unspecified: Secondary | ICD-10-CM | POA: Diagnosis not present

## 2022-06-24 DIAGNOSIS — Z79899 Other long term (current) drug therapy: Secondary | ICD-10-CM | POA: Diagnosis not present

## 2022-06-24 DIAGNOSIS — M0579 Rheumatoid arthritis with rheumatoid factor of multiple sites without organ or systems involvement: Secondary | ICD-10-CM | POA: Diagnosis not present

## 2022-06-24 DIAGNOSIS — L4050 Arthropathic psoriasis, unspecified: Secondary | ICD-10-CM | POA: Diagnosis not present

## 2022-06-24 DIAGNOSIS — M5136 Other intervertebral disc degeneration, lumbar region: Secondary | ICD-10-CM | POA: Diagnosis not present

## 2022-06-24 DIAGNOSIS — M25511 Pain in right shoulder: Secondary | ICD-10-CM | POA: Diagnosis not present

## 2022-06-24 DIAGNOSIS — M542 Cervicalgia: Secondary | ICD-10-CM | POA: Diagnosis not present

## 2022-06-26 DIAGNOSIS — K222 Esophageal obstruction: Secondary | ICD-10-CM | POA: Diagnosis not present

## 2022-06-26 DIAGNOSIS — R1310 Dysphagia, unspecified: Secondary | ICD-10-CM | POA: Diagnosis not present

## 2022-06-26 DIAGNOSIS — K219 Gastro-esophageal reflux disease without esophagitis: Secondary | ICD-10-CM | POA: Diagnosis not present

## 2022-07-02 DIAGNOSIS — K222 Esophageal obstruction: Secondary | ICD-10-CM | POA: Diagnosis not present

## 2022-07-02 DIAGNOSIS — K2100 Gastro-esophageal reflux disease with esophagitis, without bleeding: Secondary | ICD-10-CM | POA: Diagnosis not present

## 2022-07-02 DIAGNOSIS — R1310 Dysphagia, unspecified: Secondary | ICD-10-CM | POA: Diagnosis not present

## 2022-07-08 ENCOUNTER — Encounter: Payer: Self-pay | Admitting: Nurse Practitioner

## 2022-07-08 ENCOUNTER — Ambulatory Visit (INDEPENDENT_AMBULATORY_CARE_PROVIDER_SITE_OTHER): Payer: Medicare Other | Admitting: Nurse Practitioner

## 2022-07-08 VITALS — BP 112/72 | HR 71 | Ht 69.0 in | Wt 162.0 lb

## 2022-07-08 DIAGNOSIS — R0683 Snoring: Secondary | ICD-10-CM

## 2022-07-08 DIAGNOSIS — R351 Nocturia: Secondary | ICD-10-CM | POA: Diagnosis not present

## 2022-07-08 DIAGNOSIS — R400 Somnolence: Secondary | ICD-10-CM | POA: Diagnosis not present

## 2022-07-08 DIAGNOSIS — G4700 Insomnia, unspecified: Secondary | ICD-10-CM | POA: Diagnosis not present

## 2022-07-08 NOTE — Progress Notes (Signed)
$'@Patient'F$  ID: Kevin Lopez, male    DOB: [birth year], 72 y.o.   MRN: [id number]  Chief Complaint  Patient presents with   Consult    Pt sleep consult he states that he had a sleep study but was negative for apneas. Pt states he told his PCP he isn't sleeping at night and they referred him to our office. Pt reports he gets approx 2-4hrs/night.     Referring provider: [name], MD  HPI: 72 year old male, never smoker referred for sleep consult.  Past medical history significant for insomnia, hypertension, colon cancer s/p colectomy, DM II, RA, CKD stage III, HLD.   TEST/EVENTS:   07/08/2022: Today - sleep consult Patient presents today for sleep consult, referred by Dr. Ashby Dawes.  He has been having trouble with his sleep for many years now.  He falls asleep quickly but tends to wake around 2 to 3 AM and has trouble going back to sleep.  He usually tosses and turns until around 5 to 6 AM.  He does have RA and is unsure if it's related to this. He has been tried on a few sleep medications without much success.  He is currently taking trazodone and clonazepam.  He does have some daytime fatigue symptoms.  He has been told in the past that he snores. Denies any morning headaches, witnessed apneas, drowsy driving, sleep parasomnia/paralysis.  No history of narcolepsy or cataplexy symptoms. He goes to bed around 11pm-midnight.  Falls asleep in less than 30 minutes.  Wakes around 2 to 3 AM.  Officially gets out of bed around 5 to 6 AM.  He has had a sleep study years ago which was negative for sleep apnea.  He has never used a CPAP for.  Weight is up about 17 pounds over the last 2 years.   He has history of high blood pressure, diabetes and CKD.  Currently well-controlled on medications.  He is a never smoker.  Does not drink any alcohol.  No excessive caffeine intake.  Lives by himself.  He is retired.  Family history of cancer.  Ep[place] 2   Allergies  Allergen Reactions    Latex Shortness Of Breath   Irbesartan Hives    swelling    Metformin Diarrhea, Nausea And Vomiting and Swelling    Immunization History  Administered Date(s) Administered   Fluad Quad(high Dose 65+) 04/18/2020, 06/11/2022   Influenza-Unspecified 05/20/2018   Moderna Sars-Covid-2 Vaccination 06/11/2019, 07/13/2019    Past Medical History:  Diagnosis Date   Chronic kidney disease    Diabetes mellitus without complication (HCC)    Diverticulosis    Hyperlipidemia    Hypertension    RA (rheumatoid arthritis) ([place])    SBO (small bowel obstruction) s/p ex lap in past 12/15/2015    Tobacco History: Social History   Tobacco Use  Smoking Status Never  Smokeless Tobacco Never   Counseling given: Not Answered   Outpatient Medications Prior to Visit  Medication Sig Dispense Refill   Abatacept (ORENCIA) 125 MG/ML SOSY Inject 125 mg into the skin every 30 (thirty) days.     acetaminophen (TYLENOL) 500 MG tablet Take 500 mg by mouth every 6 (six) hours as needed for moderate pain.     amLODipine (NORVASC) 10 MG tablet Take 10 mg by mouth daily.     Ascorbic Acid (VITAMIN C) 1000 MG tablet Take 1,000 mg by mouth daily.     carvedilol (COREG) 12.5 MG tablet Take 12.5 mg by mouth 2 (two) times  daily with a meal.     clonazePAM (KLONOPIN) 0.5 MG tablet Take 0.5 mg by mouth at bedtime.     clonazePAM (KLONOPIN) 1 MG tablet Take 1 mg by mouth as needed for anxiety.     Cyanocobalamin (B-12 PO) Take 1 capsule by mouth daily.     dapagliflozin propanediol (FARXIGA) 5 MG TABS tablet Take 5 mg by mouth daily.     ergocalciferol (VITAMIN D2) 1.25 MG (50000 UT) capsule Take 50,000 Units by mouth once a week.     fluticasone (FLONASE) 50 MCG/ACT nasal spray Place 1 spray into both nostrils daily as needed for allergies.     HUMIRA PEN 40 MG/0.4ML PNKT SMARTSIG:40 Milligram(s) SUB-Q Every 2 Weeks     hydrALAZINE (APRESOLINE) 25 MG tablet Take 25 mg by mouth in the morning and at bedtime.      hydrOXYzine (ATARAX/VISTARIL) 25 MG tablet Take 25 mg by mouth daily as needed for anxiety.     linagliptin (TRADJENTA) 5 MG TABS tablet Take 5 mg by mouth daily.     lovastatin (MEVACOR) 20 MG tablet Take 20 mg by mouth daily at 12 noon.     Multiple Vitamin (MULTIVITAMIN WITH MINERALS) TABS tablet Take 1 tablet by mouth daily.     traZODone (DESYREL) 50 MG tablet Take 50-100 mg by mouth at bedtime as needed.     methocarbamol (ROBAXIN) 500 MG tablet Take 1 tablet (500 mg total) by mouth every 6 (six) hours as needed for muscle spasms. 30 tablet 0   oxyCODONE (OXY IR/ROXICODONE) 5 MG immediate release tablet Take 1-2 tablets (5-10 mg total) by mouth every 4 (four) hours as needed for breakthrough pain ((for MODERATE breakthrough pain)). 30 tablet 0   colchicine 0.6 MG tablet Take 1 tablet (0.6 mg total) by mouth 2 (two) times daily. 60 tablet 0   predniSONE (DELTASONE) 50 MG tablet Take 1 daily till all completed 6 tablet 0   No facility-administered medications prior to visit.     Review of Systems:   Constitutional: No night sweats, fevers, chills, or lassitude. +occasional daytime fatigue, weight gain HEENT: No headaches, difficulty swallowing, tooth/dental problems, or sore throat. No sneezing, itching, ear ache, nasal congestion, or post nasal drip CV:  No chest pain, orthopnea, PND, swelling in lower extremities, anasarca, dizziness, palpitations, syncope Resp: +snoring. No shortness of breath with exertion or at rest. No excess mucus or change in color of mucus. No productive or non-productive. No hemoptysis. No wheezing.  No chest wall deformity GI:  No heartburn, indigestion, abdominal pain, nausea, vomiting, diarrhea, change in bowel habits, loss of appetite GU: No dysuria, change in color of urine, urgency or frequency.  Skin: No rash, lesions, ulcerations MSK:  +joint stiffness/swelling.   Neuro: No dizziness or lightheadedness.  Psych: No depression or anxiety. Mood stable.  +sleep disturbance    Physical Exam:  BP 112/72   Pulse 71   Ht '5\' 9"'$  (1.753 m)   Wt 162 lb (73.5 kg)   SpO2 98%   BMI 23.92 kg/m   GEN: Pleasant, interactive, well-appearing; in no acute distress HEENT:  Normocephalic and atraumatic. PERRLA. Sclera white. Nasal turbinates pink, moist and patent bilaterally. No rhinorrhea present. Oropharynx pink and moist, without exudate or edema. No lesions, ulcerations, or postnasal drip. Mallampati II/III NECK:  Supple w/ fair ROM. No JVD present. Normal carotid impulses w/o bruits. Thyroid symmetrical with no goiter or nodules palpated. No lymphadenopathy.   CV: RRR, no m/r/g, no peripheral edema.  Pulses intact, +2 bilaterally. No cyanosis, pallor or clubbing. PULMONARY:  Unlabored, regular breathing. Clear bilaterally A&P w/o wheezes/rales/rhonchi. No accessory muscle use.  GI: BS present and normoactive. Soft, non-tender to palpation. No organomegaly or masses detected.  MSK: No erythema, warmth or tenderness. Cap refil <2 sec all extrem. No deformities or joint swelling noted.  Neuro: A/Ox3. No focal deficits noted.   Skin: Warm, no lesions or rashe Psych: Normal affect and behavior. Judgement and thought content appropriate.     Lab Results:  CBC    Component Value Date/Time   WBC 14.0 (H) 06/03/2022 1313   RBC 4.53 06/03/2022 1313   HGB 12.1 (L) 06/03/2022 1313   HGB 10.2 (L) 07/31/2021 1426   HCT 37.9 (L) 06/03/2022 1313   HCT 31.8 (L) 07/31/2021 1426   PLT 422 (H) 06/03/2022 1313   MCV 83.7 06/03/2022 1313   MCH 26.7 06/03/2022 1313   MCHC 31.9 06/03/2022 1313   RDW 14.3 06/03/2022 1313   LYMPHSABS 4.4 (H) 06/03/2022 1313   MONOABS 0.7 06/03/2022 1313   EOSABS 0.6 (H) 06/03/2022 1313   BASOSABS 0.1 06/03/2022 1313    BMET    Component Value Date/Time   NA 134 (L) 06/03/2022 1313   NA 140 08/06/2021 1157   K 4.5 06/03/2022 1313   CL 102 06/03/2022 1313   CO2 24 06/03/2022 1313   GLUCOSE 137 (H) 06/03/2022 1313    BUN 33 (H) 06/03/2022 1313   BUN 29 (H) 08/06/2021 1157   CREATININE 1.84 (H) 06/03/2022 1313   CALCIUM 9.0 06/03/2022 1313   GFRNONAA 39 (L) 06/03/2022 1313   GFRAA >60 12/09/2019 1439    BNP No results found for: "BNP"   Imaging:  No results found.  influenza vaccine adjuvanted (FLUAD) injection 0.5 mL     Date Action Dose Route User   06/11/2022 1150 Given 0.5 mL Intramuscular (Left Deltoid) Penelope Galas, RN           No data to display          No results found for: "NITRICOXIDE"      Assessment & Plan:   Insomnia He has snoring, daytime sleepiness, restless sleep despite pharmacological therapy. Given this,  I am concerned he could have sleep disordered breathing with obstructive sleep apnea. He will need sleep study for further evaluation. If no evidence of sleep disordered breathing or if symptoms persist despite treatment of this, can consider alternative pharmacological therapies.    - discussed how weight can impact sleep and risk for sleep disordered breathing - discussed options to assist with weight loss: combination of diet modification, cardiovascular and strength training exercises   - had an extensive discussion regarding the adverse health consequences related to untreated sleep disordered breathing - specifically discussed the risks for hypertension, coronary artery disease, cardiac dysrhythmias, cerebrovascular disease, and diabetes - lifestyle modification discussed   - discussed how sleep disruption can increase risk of accidents, particularly when driving - safe driving practices were discussed  Patient Instructions  Given your symptoms, I am concerned that you could have sleep disordered breathing with sleep apnea. You will need a sleep study for further evaluation. Someone will contact you to schedule this.   We discussed how untreated sleep apnea puts an individual at risk for cardiac arrhthymias, pulm HTN, DM, stroke and  increases their risk for daytime accidents. We also briefly reviewed treatment options including weight loss, side sleeping position, oral appliance, CPAP therapy or referral to ENT for  possible surgical options  Use caution when driving and pull over if you become sleepy  Follow up after your home sleep study in 8 weeks with Joellen Jersey Mataya Kilduff,NP or sooner, if needed    Snoring See above   I spent 35 minutes of dedicated to the care of this patient on the date of this encounter to include pre-visit review of records, face-to-face time with the patient discussing conditions above, post visit ordering of testing, clinical documentation with the electronic health record, making appropriate referrals as documented, and communicating necessary findings to members of the patients care team.  Clayton Bibles, NP 07/12/2022  Pt aware and understands NP's role.

## 2022-07-08 NOTE — Patient Instructions (Signed)
Given your symptoms, I am concerned that you could have sleep disordered breathing with sleep apnea. You will need a sleep study for further evaluation. Someone will contact you to schedule this.   We discussed how untreated sleep apnea puts an individual at risk for cardiac arrhthymias, pulm HTN, DM, stroke and increases their risk for daytime accidents. We also briefly reviewed treatment options including weight loss, side sleeping position, oral appliance, CPAP therapy or referral to ENT for possible surgical options  Use caution when driving and pull over if you become sleepy  Follow up after your home sleep study in 8 weeks with Kevin Renesmee Raine,NP or sooner, if needed

## 2022-07-10 DIAGNOSIS — M25512 Pain in left shoulder: Secondary | ICD-10-CM | POA: Diagnosis not present

## 2022-07-10 DIAGNOSIS — M545 Low back pain, unspecified: Secondary | ICD-10-CM | POA: Diagnosis not present

## 2022-07-10 DIAGNOSIS — M25611 Stiffness of right shoulder, not elsewhere classified: Secondary | ICD-10-CM | POA: Diagnosis not present

## 2022-07-10 DIAGNOSIS — M62551 Muscle wasting and atrophy, not elsewhere classified, right thigh: Secondary | ICD-10-CM | POA: Diagnosis not present

## 2022-07-10 DIAGNOSIS — M25612 Stiffness of left shoulder, not elsewhere classified: Secondary | ICD-10-CM | POA: Diagnosis not present

## 2022-07-10 DIAGNOSIS — M62512 Muscle wasting and atrophy, not elsewhere classified, left shoulder: Secondary | ICD-10-CM | POA: Diagnosis not present

## 2022-07-10 DIAGNOSIS — M25511 Pain in right shoulder: Secondary | ICD-10-CM | POA: Diagnosis not present

## 2022-07-11 DIAGNOSIS — N183 Chronic kidney disease, stage 3 unspecified: Secondary | ICD-10-CM | POA: Diagnosis not present

## 2022-07-11 DIAGNOSIS — I129 Hypertensive chronic kidney disease with stage 1 through stage 4 chronic kidney disease, or unspecified chronic kidney disease: Secondary | ICD-10-CM | POA: Diagnosis not present

## 2022-07-11 DIAGNOSIS — D631 Anemia in chronic kidney disease: Secondary | ICD-10-CM | POA: Diagnosis not present

## 2022-07-11 DIAGNOSIS — N2581 Secondary hyperparathyroidism of renal origin: Secondary | ICD-10-CM | POA: Diagnosis not present

## 2022-07-12 ENCOUNTER — Encounter: Payer: Self-pay | Admitting: Nurse Practitioner

## 2022-07-12 DIAGNOSIS — R0683 Snoring: Secondary | ICD-10-CM | POA: Insufficient documentation

## 2022-07-12 DIAGNOSIS — G4700 Insomnia, unspecified: Secondary | ICD-10-CM | POA: Insufficient documentation

## 2022-07-12 NOTE — Assessment & Plan Note (Signed)
See above

## 2022-07-12 NOTE — Assessment & Plan Note (Signed)
He has snoring, daytime sleepiness, restless sleep despite pharmacological therapy. Given this,  I am concerned he could have sleep disordered breathing with obstructive sleep apnea. He will need sleep study for further evaluation. If no evidence of sleep disordered breathing or if symptoms persist despite treatment of this, can consider alternative pharmacological therapies.    - discussed how weight can impact sleep and risk for sleep disordered breathing - discussed options to assist with weight loss: combination of diet modification, cardiovascular and strength training exercises   - had an extensive discussion regarding the adverse health consequences related to untreated sleep disordered breathing - specifically discussed the risks for hypertension, coronary artery disease, cardiac dysrhythmias, cerebrovascular disease, and diabetes - lifestyle modification discussed   - discussed how sleep disruption can increase risk of accidents, particularly when driving - safe driving practices were discussed  Patient Instructions  Given your symptoms, I am concerned that you could have sleep disordered breathing with sleep apnea. You will need a sleep study for further evaluation. Someone will contact you to schedule this.   We discussed how untreated sleep apnea puts an individual at risk for cardiac arrhthymias, pulm HTN, DM, stroke and increases their risk for daytime accidents. We also briefly reviewed treatment options including weight loss, side sleeping position, oral appliance, CPAP therapy or referral to ENT for possible surgical options  Use caution when driving and pull over if you become sleepy  Follow up after your home sleep study in 8 weeks with Kevin Chaya Dehaan,NP or sooner, if needed

## 2022-07-16 DIAGNOSIS — M25612 Stiffness of left shoulder, not elsewhere classified: Secondary | ICD-10-CM | POA: Diagnosis not present

## 2022-07-16 DIAGNOSIS — M25511 Pain in right shoulder: Secondary | ICD-10-CM | POA: Diagnosis not present

## 2022-07-16 DIAGNOSIS — M545 Low back pain, unspecified: Secondary | ICD-10-CM | POA: Diagnosis not present

## 2022-07-16 DIAGNOSIS — M62512 Muscle wasting and atrophy, not elsewhere classified, left shoulder: Secondary | ICD-10-CM | POA: Diagnosis not present

## 2022-07-16 DIAGNOSIS — M25611 Stiffness of right shoulder, not elsewhere classified: Secondary | ICD-10-CM | POA: Diagnosis not present

## 2022-07-16 DIAGNOSIS — M25512 Pain in left shoulder: Secondary | ICD-10-CM | POA: Diagnosis not present

## 2022-07-16 DIAGNOSIS — M62551 Muscle wasting and atrophy, not elsewhere classified, right thigh: Secondary | ICD-10-CM | POA: Diagnosis not present

## 2022-07-31 DIAGNOSIS — M545 Low back pain, unspecified: Secondary | ICD-10-CM | POA: Diagnosis not present

## 2022-07-31 DIAGNOSIS — M62551 Muscle wasting and atrophy, not elsewhere classified, right thigh: Secondary | ICD-10-CM | POA: Diagnosis not present

## 2022-07-31 DIAGNOSIS — M25611 Stiffness of right shoulder, not elsewhere classified: Secondary | ICD-10-CM | POA: Diagnosis not present

## 2022-07-31 DIAGNOSIS — M25511 Pain in right shoulder: Secondary | ICD-10-CM | POA: Diagnosis not present

## 2022-07-31 DIAGNOSIS — M62512 Muscle wasting and atrophy, not elsewhere classified, left shoulder: Secondary | ICD-10-CM | POA: Diagnosis not present

## 2022-07-31 DIAGNOSIS — M25512 Pain in left shoulder: Secondary | ICD-10-CM | POA: Diagnosis not present

## 2022-07-31 DIAGNOSIS — M25612 Stiffness of left shoulder, not elsewhere classified: Secondary | ICD-10-CM | POA: Diagnosis not present

## 2022-08-02 DIAGNOSIS — M25511 Pain in right shoulder: Secondary | ICD-10-CM | POA: Diagnosis not present

## 2022-08-02 DIAGNOSIS — M25611 Stiffness of right shoulder, not elsewhere classified: Secondary | ICD-10-CM | POA: Diagnosis not present

## 2022-08-02 DIAGNOSIS — M25512 Pain in left shoulder: Secondary | ICD-10-CM | POA: Diagnosis not present

## 2022-08-02 DIAGNOSIS — M62512 Muscle wasting and atrophy, not elsewhere classified, left shoulder: Secondary | ICD-10-CM | POA: Diagnosis not present

## 2022-08-02 DIAGNOSIS — M25612 Stiffness of left shoulder, not elsewhere classified: Secondary | ICD-10-CM | POA: Diagnosis not present

## 2022-08-02 DIAGNOSIS — M545 Low back pain, unspecified: Secondary | ICD-10-CM | POA: Diagnosis not present

## 2022-08-02 DIAGNOSIS — M62551 Muscle wasting and atrophy, not elsewhere classified, right thigh: Secondary | ICD-10-CM | POA: Diagnosis not present

## 2022-08-12 DIAGNOSIS — M25511 Pain in right shoulder: Secondary | ICD-10-CM | POA: Diagnosis not present

## 2022-08-12 DIAGNOSIS — M25512 Pain in left shoulder: Secondary | ICD-10-CM | POA: Diagnosis not present

## 2022-08-12 DIAGNOSIS — M545 Low back pain, unspecified: Secondary | ICD-10-CM | POA: Diagnosis not present

## 2022-08-12 DIAGNOSIS — M25611 Stiffness of right shoulder, not elsewhere classified: Secondary | ICD-10-CM | POA: Diagnosis not present

## 2022-08-12 DIAGNOSIS — M62551 Muscle wasting and atrophy, not elsewhere classified, right thigh: Secondary | ICD-10-CM | POA: Diagnosis not present

## 2022-08-12 DIAGNOSIS — M25612 Stiffness of left shoulder, not elsewhere classified: Secondary | ICD-10-CM | POA: Diagnosis not present

## 2022-08-12 DIAGNOSIS — M62512 Muscle wasting and atrophy, not elsewhere classified, left shoulder: Secondary | ICD-10-CM | POA: Diagnosis not present

## 2022-08-13 DIAGNOSIS — Z79899 Other long term (current) drug therapy: Secondary | ICD-10-CM | POA: Diagnosis not present

## 2022-08-13 DIAGNOSIS — N289 Disorder of kidney and ureter, unspecified: Secondary | ICD-10-CM | POA: Diagnosis not present

## 2022-08-13 DIAGNOSIS — M1120 Other chondrocalcinosis, unspecified site: Secondary | ICD-10-CM | POA: Diagnosis not present

## 2022-08-13 DIAGNOSIS — R21 Rash and other nonspecific skin eruption: Secondary | ICD-10-CM | POA: Diagnosis not present

## 2022-08-13 DIAGNOSIS — L4050 Arthropathic psoriasis, unspecified: Secondary | ICD-10-CM | POA: Diagnosis not present

## 2022-08-13 DIAGNOSIS — M0579 Rheumatoid arthritis with rheumatoid factor of multiple sites without organ or systems involvement: Secondary | ICD-10-CM | POA: Diagnosis not present

## 2022-08-13 DIAGNOSIS — M109 Gout, unspecified: Secondary | ICD-10-CM | POA: Diagnosis not present

## 2022-08-13 DIAGNOSIS — M5136 Other intervertebral disc degeneration, lumbar region: Secondary | ICD-10-CM | POA: Diagnosis not present

## 2022-08-20 DIAGNOSIS — Z789 Other specified health status: Secondary | ICD-10-CM | POA: Diagnosis not present

## 2022-08-20 DIAGNOSIS — L638 Other alopecia areata: Secondary | ICD-10-CM | POA: Diagnosis not present

## 2022-08-20 DIAGNOSIS — L298 Other pruritus: Secondary | ICD-10-CM | POA: Diagnosis not present

## 2022-08-20 DIAGNOSIS — L732 Hidradenitis suppurativa: Secondary | ICD-10-CM | POA: Diagnosis not present

## 2022-08-20 DIAGNOSIS — R208 Other disturbances of skin sensation: Secondary | ICD-10-CM | POA: Diagnosis not present

## 2022-08-20 DIAGNOSIS — L538 Other specified erythematous conditions: Secondary | ICD-10-CM | POA: Diagnosis not present

## 2022-09-29 DIAGNOSIS — M069 Rheumatoid arthritis, unspecified: Secondary | ICD-10-CM | POA: Diagnosis not present

## 2022-09-29 DIAGNOSIS — E785 Hyperlipidemia, unspecified: Secondary | ICD-10-CM | POA: Diagnosis not present

## 2022-09-29 DIAGNOSIS — R509 Fever, unspecified: Secondary | ICD-10-CM | POA: Diagnosis not present

## 2022-09-29 DIAGNOSIS — I444 Left anterior fascicular block: Secondary | ICD-10-CM | POA: Diagnosis not present

## 2022-09-29 DIAGNOSIS — Z4659 Encounter for fitting and adjustment of other gastrointestinal appliance and device: Secondary | ICD-10-CM | POA: Diagnosis not present

## 2022-09-29 DIAGNOSIS — K5289 Other specified noninfective gastroenteritis and colitis: Secondary | ICD-10-CM | POA: Diagnosis not present

## 2022-09-29 DIAGNOSIS — K56609 Unspecified intestinal obstruction, unspecified as to partial versus complete obstruction: Secondary | ICD-10-CM | POA: Diagnosis not present

## 2022-09-29 DIAGNOSIS — K5641 Fecal impaction: Secondary | ICD-10-CM | POA: Diagnosis not present

## 2022-09-29 DIAGNOSIS — R918 Other nonspecific abnormal finding of lung field: Secondary | ICD-10-CM | POA: Diagnosis not present

## 2022-09-29 DIAGNOSIS — R9431 Abnormal electrocardiogram [ECG] [EKG]: Secondary | ICD-10-CM | POA: Diagnosis not present

## 2022-09-29 DIAGNOSIS — R1110 Vomiting, unspecified: Secondary | ICD-10-CM | POA: Diagnosis not present

## 2022-09-29 DIAGNOSIS — F419 Anxiety disorder, unspecified: Secondary | ICD-10-CM | POA: Diagnosis not present

## 2022-09-29 DIAGNOSIS — K5939 Other megacolon: Secondary | ICD-10-CM | POA: Diagnosis not present

## 2022-09-29 DIAGNOSIS — E1122 Type 2 diabetes mellitus with diabetic chronic kidney disease: Secondary | ICD-10-CM | POA: Diagnosis not present

## 2022-09-29 DIAGNOSIS — N189 Chronic kidney disease, unspecified: Secondary | ICD-10-CM | POA: Diagnosis not present

## 2022-09-29 DIAGNOSIS — Z8719 Personal history of other diseases of the digestive system: Secondary | ICD-10-CM | POA: Diagnosis not present

## 2022-09-29 DIAGNOSIS — I129 Hypertensive chronic kidney disease with stage 1 through stage 4 chronic kidney disease, or unspecified chronic kidney disease: Secondary | ICD-10-CM | POA: Diagnosis not present

## 2022-09-29 DIAGNOSIS — E119 Type 2 diabetes mellitus without complications: Secondary | ICD-10-CM | POA: Diagnosis not present

## 2022-09-29 DIAGNOSIS — Z01818 Encounter for other preprocedural examination: Secondary | ICD-10-CM | POA: Diagnosis not present

## 2022-09-29 DIAGNOSIS — Z85038 Personal history of other malignant neoplasm of large intestine: Secondary | ICD-10-CM | POA: Diagnosis not present

## 2022-09-29 DIAGNOSIS — Z9049 Acquired absence of other specified parts of digestive tract: Secondary | ICD-10-CM | POA: Diagnosis not present

## 2022-09-29 DIAGNOSIS — R109 Unspecified abdominal pain: Secondary | ICD-10-CM | POA: Diagnosis not present

## 2022-10-18 DIAGNOSIS — N35919 Unspecified urethral stricture, male, unspecified site: Secondary | ICD-10-CM | POA: Diagnosis not present

## 2022-10-18 DIAGNOSIS — N182 Chronic kidney disease, stage 2 (mild): Secondary | ICD-10-CM | POA: Diagnosis not present

## 2022-10-18 DIAGNOSIS — N529 Male erectile dysfunction, unspecified: Secondary | ICD-10-CM | POA: Diagnosis not present

## 2022-10-18 DIAGNOSIS — R351 Nocturia: Secondary | ICD-10-CM | POA: Diagnosis not present

## 2022-10-18 DIAGNOSIS — N401 Enlarged prostate with lower urinary tract symptoms: Secondary | ICD-10-CM | POA: Diagnosis not present

## 2022-10-18 DIAGNOSIS — R81 Glycosuria: Secondary | ICD-10-CM | POA: Diagnosis not present

## 2022-10-18 DIAGNOSIS — R7309 Other abnormal glucose: Secondary | ICD-10-CM | POA: Diagnosis not present

## 2022-10-18 DIAGNOSIS — Z125 Encounter for screening for malignant neoplasm of prostate: Secondary | ICD-10-CM | POA: Diagnosis not present

## 2022-10-18 DIAGNOSIS — E291 Testicular hypofunction: Secondary | ICD-10-CM | POA: Diagnosis not present

## 2022-10-18 DIAGNOSIS — E1122 Type 2 diabetes mellitus with diabetic chronic kidney disease: Secondary | ICD-10-CM | POA: Diagnosis not present

## 2022-10-18 DIAGNOSIS — R3915 Urgency of urination: Secondary | ICD-10-CM | POA: Diagnosis not present

## 2022-10-18 DIAGNOSIS — I10 Essential (primary) hypertension: Secondary | ICD-10-CM | POA: Diagnosis not present

## 2022-10-18 DIAGNOSIS — L732 Hidradenitis suppurativa: Secondary | ICD-10-CM | POA: Diagnosis not present

## 2022-11-15 ENCOUNTER — Encounter (HOSPITAL_COMMUNITY): Payer: Self-pay | Admitting: Hematology

## 2022-11-22 ENCOUNTER — Ambulatory Visit: Payer: Medicare Other | Admitting: Cardiology

## 2022-11-22 ENCOUNTER — Encounter (HOSPITAL_COMMUNITY): Payer: Self-pay | Admitting: Hematology

## 2022-11-22 ENCOUNTER — Encounter: Payer: Self-pay | Admitting: Cardiology

## 2022-11-22 VITALS — BP 160/86 | HR 62 | Resp 16 | Ht 69.0 in | Wt 163.0 lb

## 2022-11-22 DIAGNOSIS — E1169 Type 2 diabetes mellitus with other specified complication: Secondary | ICD-10-CM

## 2022-11-22 DIAGNOSIS — R0602 Shortness of breath: Secondary | ICD-10-CM | POA: Diagnosis not present

## 2022-11-22 DIAGNOSIS — N1831 Chronic kidney disease, stage 3a: Secondary | ICD-10-CM

## 2022-11-22 DIAGNOSIS — I10 Essential (primary) hypertension: Secondary | ICD-10-CM

## 2022-11-22 DIAGNOSIS — E1122 Type 2 diabetes mellitus with diabetic chronic kidney disease: Secondary | ICD-10-CM | POA: Diagnosis not present

## 2022-11-22 DIAGNOSIS — E785 Hyperlipidemia, unspecified: Secondary | ICD-10-CM | POA: Diagnosis not present

## 2022-11-22 NOTE — Progress Notes (Signed)
Date:  11/22/2022   ID:  Kevin Lopez, DOB Jun 19, 1950, MRN [id number]  PCP:  Georgianne Fick, MD  Cardiologist:  Tessa Lerner, DO, [facility] (established care 07/31/2021) Nephrologist: Dr. Zetta Bills.   Date: 11/22/22 Last Office Visit: 11/22/2021  Chief Complaint  Patient presents with   Shortness of Breath   Follow-up    HPI  Kevin Lopez is a 72 y.o. 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  Referred to the practice for evaluation of shortness of breath back in March 2023.  Given his risk factors including diabetes he underwent ischemic workup as outlined below.  It was felt that his shortness of breath was likely secondary to uncontrolled hypertension.  He requested assistance with blood pressure management in the past.  He presents today for 1 year follow-up visit.  He denies anginal chest pain or heart failure symptoms.  At home patient states that his blood pressures are very well-controlled with SBP between 115-120 mmHg and DBP between 70-80 mmHg.  His pressures are being managed by both primary and nephrology.  Telmisartan in the past was discontinued due to hyperkalemia.  Patient has been hesitant to be on ACE inhibitor/ARB due to concerns for hyperkalemia.   FUNCTIONAL STATUS: Three to four times a week (stationary bike and stairs).    ALLERGIES: Allergies  Allergen Reactions   Latex Shortness Of Breath   Irbesartan Hives    swelling    Metformin Diarrhea, Nausea And Vomiting and Swelling    MEDICATION LIST PRIOR TO VISIT: Current Meds  Medication Sig   acetaminophen (TYLENOL) 500 MG tablet Take 500 mg by mouth every 6 (six) hours as needed for moderate pain.   amLODipine (NORVASC) 10 MG tablet Take 10 mg by mouth daily.   Ascorbic Acid (VITAMIN C) 1000 MG tablet  Take 1,000 mg by mouth daily.   carvedilol (COREG) 12.5 MG tablet Take 12.5 mg by mouth 2 (two) times daily with a meal.   clonazePAM (KLONOPIN) 0.5 MG tablet Take 0.5 mg by mouth at bedtime.   Cyanocobalamin (B-12 PO) Take 1 capsule by mouth daily.   dapagliflozin propanediol (FARXIGA) 10 MG TABS tablet Take 10 mg by mouth daily.   ergocalciferol (VITAMIN D2) 1.25 MG (50000 UT) capsule Take 50,000 Units by mouth once a week.   fluticasone (FLONASE) 50 MCG/ACT nasal spray Place 1 spray into both nostrils daily as needed for allergies.   HUMIRA PEN 40 MG/0.4ML PNKT SMARTSIG:40 Milligram(s) SUB-Q Every 2 Weeks   hydrALAZINE (APRESOLINE) 25 MG tablet Take 25 mg by mouth in the morning and at bedtime.   hydrOXYzine (ATARAX/VISTARIL) 25 MG tablet Take 25 mg by mouth daily as needed for anxiety.   linagliptin (TRADJENTA) 5 MG TABS tablet Take 5 mg by mouth daily.   lovastatin (MEVACOR) 20 MG tablet Take 20 mg by mouth daily at 12 noon.   Multiple Vitamin (MULTIVITAMIN WITH MINERALS) TABS tablet Take 1 tablet by mouth daily.   traZODone (DESYREL) 50 MG tablet Take 50-100 mg by mouth at bedtime as needed.     PAST MEDICAL HISTORY: Past Medical History:  Diagnosis Date   Chronic kidney disease    Diabetes mellitus without complication (HCC)    Diverticulosis    Hyperlipidemia    Hypertension    RA (rheumatoid arthritis) (HCC)    SBO (small bowel obstruction) s/p ex lap in past 12/15/2015  PAST SURGICAL HISTORY: Past Surgical History:  Procedure Laterality Date   APPENDECTOMY     COLOSTOMY TAKEDOWN     [place]   FLEXIBLE SIGMOIDOSCOPY N/A 12/15/2015   Procedure: FLEXIBLE SIGMOIDOSCOPY;  Surgeon: West Bali, MD;  Location: [facility];  Service: Endoscopy;  Laterality: N/A;   HIP ARTHROPLASTY Left 12/06/2021   Procedure: LEFT HIP ARTHROSCOPY IRRIGATION AND DEBRIDEMENT.;  Surgeon: Huel Cote, MD;  Location: [facility] OR;  Service: Orthopedics;  Laterality: Left;   KNEE SURGERY Right     LEFT COLECTOMY     [place]   LYSIS OF ADHESION     SBO    FAMILY HISTORY: The patient family history includes Cancer in his mother; Heart attack in his father; Hypertension in his brother and sister.  SOCIAL HISTORY:  The patient  reports that he has never smoked. He has never used smokeless tobacco. He reports current alcohol use. He reports that he does not use drugs.  REVIEW OF SYSTEMS: Review of Systems  Cardiovascular:  Negative for chest pain, claudication, dyspnea on exertion, irregular heartbeat, leg swelling, near-syncope, orthopnea, palpitations, paroxysmal nocturnal dyspnea and syncope.  Respiratory:  Positive for snoring. Negative for shortness of breath.   Hematologic/Lymphatic: Negative for bleeding problem.  Musculoskeletal:  Negative for muscle cramps and myalgias.  Neurological:  Negative for dizziness and light-headedness.    PHYSICAL EXAM:    11/22/2022    2:09 PM 11/22/2022    1:48 PM 11/22/2022    1:39 PM  Vitals with BMI  Height   5\' 9"   Weight   163 lbs  BMI   24.06  Systolic 160 180 409  Diastolic 86 94 85  Pulse 62 59 56   Right arm 160/86. Left arm 162/84  Physical Exam  Constitutional: No distress.  Age appropriate, hemodynamically stable.   Neck: No JVD present.  Cardiovascular: Normal rate, regular rhythm, S1 normal, S2 normal, intact distal pulses and normal pulses. Exam reveals no gallop, no S3 and no S4.  No murmur heard. Pulmonary/Chest: Effort normal and breath sounds normal. No stridor. He has no wheezes. He has no rales.  Abdominal: Soft. Bowel sounds are normal. He exhibits no distension. There is no abdominal tenderness.  Musculoskeletal:        General: No edema.     Cervical back: Neck supple.  Neurological: He is alert and oriented to person, place, and time. He has intact cranial nerves (2-12).  Skin: Skin is warm and moist.   CARDIAC DATABASE: EKG: November 22, 2022: Sinus bradycardia, 54 bpm, left axis deviation, without underlying  ischemia or injury pattern.  No significant change compared to prior ECG dated 07/31/2021.  Echocardiogram: 08/06/2021: Normal LV systolic function with visual EF 60-65%. Left ventricle cavity is normal in size. Normal left ventricular wall thickness. Normal global wall motion. Normal diastolic filling pattern, normal LAP.  Trace tricuspid regurgitation. No evidence of pulmonary hypertension. No prior study for comparison.   Stress Testing: Exercise Myoview stress test 11/14/2021: Exercise nuclear stress test was performed using Bruce protocol.  1 Day Rest and Stress images. Exercise time 5 minutes 55 seconds, achieved 7.05 METS, 85% APMHR.  Stress ECG negative for ischemia -of note, rare PVCs at rest and in recovery. Normal myocardial perfusion without evidence of reversible myocardial ischemia or prior infarct. Left ventricular size dilated, no significant regional wall motion abnormalities, calculated LVEF 61%. No prior studies for comparison. Low risk study.   Heart Catheterization: None  LABORATORY DATA: CBC With Platelet And  Differential RS In-house   2021-11-08    Absolute Basophils 0.1   0.0-0.1  Absolute Eosinophils 1.3   0.0-0.6  Absolute Lymphocytes 3.4   0.9-3.6  Absolute Monocytes 0.9   0.3-1.0  Absolute Neutrophils 8.8   2.0-8.2  Basophils Automated 0.5   0.0-2.0  Eosinophils Automated 8.9   0.0-7.0  Hematocrit 31.6   37.0-47.0  Hemoglobin 10.2   13.5-18.0  Lymphocytes Automated 23.5   20.5-51.1  MCH 25.6   26.0-33.0  MCHC 32.3   32.0-36.0  MCV 79.2   80.0-100.0  Monocytes Automated 6.4   5.0-12.0  Neutrophils Automated 60.7   42.2-75.2  Platelet Count 393   140-400  RBC 3.99   4.20-5.40  RDW 49.3      WBC 14.4   4.5-11.0  Comprehensive Metabolic Panel RS In-house   2021-11-08    Albumin 3.7   3.4-5.0  Albumin/Globulin Ratio 1.1   1.1-2.5  Alkaline Phosphatase 72   25-150  ALT (SGPT) 16   <6-78  AST (SGOT) 11   0-40  Bilirubin, Total 0.2   0.2-1.0  BUN  26   7-18  BUN/Creatinine Ratio 14.9   11.0-26.0  Calcium 9.3   8.5-10.1  Chloride 102   98-107  CO2 24   21-32  Creatinine 1.75   0.70-1.30  GFR/Black 45   >59  GFR/White 39   >59  Globulin, Calculated 3.4   1.5-4.6  Glucose 192   74-106  Potassium 5.1   3.5-5.1  Protein 7.1   6.4-8.2  Sodium 137   136-145   LDL Levels: 88 mg/dL-12/26/2020. 101 mg/dL-04/02/2021. 126 mg/dL-07/12/2021  IMPRESSION:    ICD-10-CM   1. Shortness of breath  R06.02 EKG 12-Lead    2. Primary hypertension  I10 PCV RENAL/RENAL ARTERY DUPLEX COMPLETE    3. Type 2 diabetes mellitus with stage 3a chronic kidney disease, without long-term current use of insulin (HCC)  E11.22    N18.31     4. Type 2 diabetes mellitus with hyperlipidemia [facility])  E11.69    E78.5        RECOMMENDATIONS: Kevin Lopez is a 72 y.o. African-American male whose past medical history and cardiac risk factors include: Essential hypertension, non-insulin-dependent diabetes mellitus type 2, hyperlipidemia, advanced age.  Shortness of breath Asymptomatic. Has undergone ischemic workup as outlined above. NT proBNP in the past has been within normal limits. EKG today is nonischemic. Monitor for now.  Primary hypertension Home blood pressures are well-controlled, per patient.  SBP between 115-120 mmHg and DBP between 70-80 mmHg. Office blood pressures are not at goal. Medications reconciled as per his medication list. Currently not on ARB/ACE inhibitor's due to concerns for hyperkalemia.  Was on telmisartan in the past.  Would like to evaluate him for resistant hypertension.  Will start with renal duplex to evaluate for renal artery stenosis. Patient does endorse snoring at night and dry mouth when he wakes up.  Raising the clinical concern for possible sleep apnea.  He has an appointment with PCP next week and will discuss further management/consultation This is home blood pressures are well-controlled we will avoid up  titration of medical therapy.  However, I have asked him to keep a better log of his blood pressures at home and if his systolic blood pressures are consistently greater than 140 mmHg I have asked him to get a call myself, nephrology or primary team. Would recommend that he chooses a provider to manage his blood pressures as opposed to multiple. Reemphasized the importance  of low-salt diet.  Type 2 diabetes mellitus with stage 3a chronic kidney disease, without long-term current use of insulin (HCC) Reemphasized importance of glycemic control. No recent labs available for review. He has labs scheduled for next week with PCP and a follow-up the week later. Will await those labs.  Type 2 diabetes mellitus with hyperlipidemia (HCC) Currently on lovastatin.   He denies myalgia or other side effects. Will await most recent lipids -as discussed above. Recommend a goal LDL <70 mg/dL in the setting of diabetes. Currently managed by primary care provider.  FINAL MEDICATION LIST END OF ENCOUNTER: No orders of the defined types were placed in this encounter.    Current Outpatient Medications:    acetaminophen (TYLENOL) 500 MG tablet, Take 500 mg by mouth every 6 (six) hours as needed for moderate pain., Disp: , Rfl:    amLODipine (NORVASC) 10 MG tablet, Take 10 mg by mouth daily., Disp: , Rfl:    Ascorbic Acid (VITAMIN C) 1000 MG tablet, Take 1,000 mg by mouth daily., Disp: , Rfl:    carvedilol (COREG) 12.5 MG tablet, Take 12.5 mg by mouth 2 (two) times daily with a meal., Disp: , Rfl:    clonazePAM (KLONOPIN) 0.5 MG tablet, Take 0.5 mg by mouth at bedtime., Disp: , Rfl:    Cyanocobalamin (B-12 PO), Take 1 capsule by mouth daily., Disp: , Rfl:    dapagliflozin propanediol (FARXIGA) 10 MG TABS tablet, Take 10 mg by mouth daily., Disp: , Rfl:    ergocalciferol (VITAMIN D2) 1.25 MG (50000 UT) capsule, Take 50,000 Units by mouth once a week., Disp: , Rfl:    fluticasone (FLONASE) 50 MCG/ACT nasal  spray, Place 1 spray into both nostrils daily as needed for allergies., Disp: , Rfl:    HUMIRA PEN 40 MG/0.4ML PNKT, SMARTSIG:40 Milligram(s) SUB-Q Every 2 Weeks, Disp: , Rfl:    hydrALAZINE (APRESOLINE) 25 MG tablet, Take 25 mg by mouth in the morning and at bedtime., Disp: , Rfl:    hydrOXYzine (ATARAX/VISTARIL) 25 MG tablet, Take 25 mg by mouth daily as needed for anxiety., Disp: , Rfl:    linagliptin (TRADJENTA) 5 MG TABS tablet, Take 5 mg by mouth daily., Disp: , Rfl:    lovastatin (MEVACOR) 20 MG tablet, Take 20 mg by mouth daily at 12 noon., Disp: , Rfl:    Multiple Vitamin (MULTIVITAMIN WITH MINERALS) TABS tablet, Take 1 tablet by mouth daily., Disp: , Rfl:    traZODone (DESYREL) 50 MG tablet, Take 50-100 mg by mouth at bedtime as needed., Disp: , Rfl:   Orders Placed This Encounter  Procedures   EKG 12-Lead   PCV RENAL/RENAL ARTERY DUPLEX COMPLETE   There are no Patient Instructions on file for this visit.   --Continue cardiac medications as reconciled in final medication list. --Return in about 3 months (around 02/22/2023) for Follow up, BP. Or sooner if needed. --Continue follow-up with your primary care physician regarding the management of your other chronic comorbid conditions.  Patient's questions and concerns were addressed to his satisfaction. He voices understanding of the instructions provided during this encounter.   This note was created using a voice recognition software as a result there may be grammatical errors inadvertently enclosed that do not reflect the nature of this encounter. Every attempt is made to correct such errors.  Tessa Lerner, [place], [facility]  Pager:  [phone] Office: [phone]

## 2022-11-25 DIAGNOSIS — E1121 Type 2 diabetes mellitus with diabetic nephropathy: Secondary | ICD-10-CM | POA: Diagnosis not present

## 2022-11-25 DIAGNOSIS — L4050 Arthropathic psoriasis, unspecified: Secondary | ICD-10-CM | POA: Diagnosis not present

## 2022-11-25 DIAGNOSIS — I10 Essential (primary) hypertension: Secondary | ICD-10-CM | POA: Diagnosis not present

## 2022-11-25 DIAGNOSIS — R5383 Other fatigue: Secondary | ICD-10-CM | POA: Diagnosis not present

## 2022-11-25 DIAGNOSIS — M0579 Rheumatoid arthritis with rheumatoid factor of multiple sites without organ or systems involvement: Secondary | ICD-10-CM | POA: Diagnosis not present

## 2022-11-25 DIAGNOSIS — E782 Mixed hyperlipidemia: Secondary | ICD-10-CM | POA: Diagnosis not present

## 2022-11-25 DIAGNOSIS — N1832 Chronic kidney disease, stage 3b: Secondary | ICD-10-CM | POA: Diagnosis not present

## 2022-11-25 DIAGNOSIS — R6889 Other general symptoms and signs: Secondary | ICD-10-CM | POA: Diagnosis not present

## 2022-11-27 DIAGNOSIS — L732 Hidradenitis suppurativa: Secondary | ICD-10-CM | POA: Diagnosis not present

## 2022-11-27 DIAGNOSIS — L728 Other follicular cysts of the skin and subcutaneous tissue: Secondary | ICD-10-CM | POA: Diagnosis not present

## 2022-11-27 DIAGNOSIS — B353 Tinea pedis: Secondary | ICD-10-CM | POA: Diagnosis not present

## 2022-12-02 DIAGNOSIS — L4050 Arthropathic psoriasis, unspecified: Secondary | ICD-10-CM | POA: Diagnosis not present

## 2022-12-02 DIAGNOSIS — Z0000 Encounter for general adult medical examination without abnormal findings: Secondary | ICD-10-CM | POA: Diagnosis not present

## 2022-12-02 DIAGNOSIS — N401 Enlarged prostate with lower urinary tract symptoms: Secondary | ICD-10-CM | POA: Diagnosis not present

## 2022-12-02 DIAGNOSIS — I129 Hypertensive chronic kidney disease with stage 1 through stage 4 chronic kidney disease, or unspecified chronic kidney disease: Secondary | ICD-10-CM | POA: Diagnosis not present

## 2022-12-02 DIAGNOSIS — N182 Chronic kidney disease, stage 2 (mild): Secondary | ICD-10-CM | POA: Diagnosis not present

## 2022-12-02 DIAGNOSIS — F419 Anxiety disorder, unspecified: Secondary | ICD-10-CM | POA: Diagnosis not present

## 2022-12-02 DIAGNOSIS — E782 Mixed hyperlipidemia: Secondary | ICD-10-CM | POA: Diagnosis not present

## 2022-12-02 DIAGNOSIS — E1122 Type 2 diabetes mellitus with diabetic chronic kidney disease: Secondary | ICD-10-CM | POA: Diagnosis not present

## 2022-12-02 DIAGNOSIS — M150 Primary generalized (osteo)arthritis: Secondary | ICD-10-CM | POA: Diagnosis not present

## 2022-12-02 DIAGNOSIS — M0589 Other rheumatoid arthritis with rheumatoid factor of multiple sites: Secondary | ICD-10-CM | POA: Diagnosis not present

## 2022-12-03 ENCOUNTER — Inpatient Hospital Stay: Payer: Medicare Other | Attending: Hematology

## 2022-12-03 DIAGNOSIS — Z5986 Financial insecurity: Secondary | ICD-10-CM | POA: Insufficient documentation

## 2022-12-03 DIAGNOSIS — M2550 Pain in unspecified joint: Secondary | ICD-10-CM | POA: Insufficient documentation

## 2022-12-03 DIAGNOSIS — D72829 Elevated white blood cell count, unspecified: Secondary | ICD-10-CM | POA: Diagnosis not present

## 2022-12-03 DIAGNOSIS — Z9049 Acquired absence of other specified parts of digestive tract: Secondary | ICD-10-CM | POA: Diagnosis not present

## 2022-12-03 DIAGNOSIS — M069 Rheumatoid arthritis, unspecified: Secondary | ICD-10-CM | POA: Insufficient documentation

## 2022-12-03 DIAGNOSIS — R5383 Other fatigue: Secondary | ICD-10-CM | POA: Diagnosis not present

## 2022-12-03 DIAGNOSIS — K56609 Unspecified intestinal obstruction, unspecified as to partial versus complete obstruction: Secondary | ICD-10-CM | POA: Diagnosis not present

## 2022-12-03 DIAGNOSIS — Z8249 Family history of ischemic heart disease and other diseases of the circulatory system: Secondary | ICD-10-CM | POA: Diagnosis not present

## 2022-12-03 DIAGNOSIS — Z79899 Other long term (current) drug therapy: Secondary | ICD-10-CM | POA: Insufficient documentation

## 2022-12-03 DIAGNOSIS — E785 Hyperlipidemia, unspecified: Secondary | ICD-10-CM | POA: Insufficient documentation

## 2022-12-03 DIAGNOSIS — Z809 Family history of malignant neoplasm, unspecified: Secondary | ICD-10-CM | POA: Diagnosis not present

## 2022-12-03 DIAGNOSIS — D649 Anemia, unspecified: Secondary | ICD-10-CM | POA: Insufficient documentation

## 2022-12-03 DIAGNOSIS — D75839 Thrombocytosis, unspecified: Secondary | ICD-10-CM | POA: Insufficient documentation

## 2022-12-03 DIAGNOSIS — R634 Abnormal weight loss: Secondary | ICD-10-CM | POA: Insufficient documentation

## 2022-12-03 DIAGNOSIS — G479 Sleep disorder, unspecified: Secondary | ICD-10-CM | POA: Insufficient documentation

## 2022-12-03 DIAGNOSIS — I10 Essential (primary) hypertension: Secondary | ICD-10-CM | POA: Insufficient documentation

## 2022-12-03 DIAGNOSIS — D72825 Bandemia: Secondary | ICD-10-CM

## 2022-12-03 DIAGNOSIS — C61 Malignant neoplasm of prostate: Secondary | ICD-10-CM

## 2022-12-03 LAB — IRON AND TIBC
Iron: 92 ug/dL (ref 45–182)
Saturation Ratios: 33 % (ref 17.9–39.5)
TIBC: 279 ug/dL (ref 250–450)
UIBC: 187 ug/dL

## 2022-12-03 LAB — CBC WITH DIFFERENTIAL/PLATELET
Abs Immature Granulocytes: 0.08 10*3/uL — ABNORMAL HIGH (ref 0.00–0.07)
Basophils Absolute: 0.2 10*3/uL — ABNORMAL HIGH (ref 0.0–0.1)
Basophils Relative: 1 %
Eosinophils Absolute: 2 10*3/uL — ABNORMAL HIGH (ref 0.0–0.5)
Eosinophils Relative: 14 %
HCT: 39.1 % (ref 39.0–52.0)
Hemoglobin: 12.4 g/dL — ABNORMAL LOW (ref 13.0–17.0)
Immature Granulocytes: 1 %
Lymphocytes Relative: 29 %
Lymphs Abs: 4.1 10*3/uL — ABNORMAL HIGH (ref 0.7–4.0)
MCH: 26.8 pg (ref 26.0–34.0)
MCHC: 31.7 g/dL (ref 30.0–36.0)
MCV: 84.6 fL (ref 80.0–100.0)
Monocytes Absolute: 0.7 10*3/uL (ref 0.1–1.0)
Monocytes Relative: 5 %
Neutro Abs: 7.2 10*3/uL (ref 1.7–7.7)
Neutrophils Relative %: 50 %
Platelets: 238 10*3/uL (ref 150–400)
RBC: 4.62 MIL/uL (ref 4.22–5.81)
RDW: 14.8 % (ref 11.5–15.5)
WBC: 14.2 10*3/uL — ABNORMAL HIGH (ref 4.0–10.5)
nRBC: 0 % (ref 0.0–0.2)

## 2022-12-03 LAB — COMPREHENSIVE METABOLIC PANEL
ALT: 17 U/L (ref 0–44)
AST: 19 U/L (ref 15–41)
Albumin: 3.9 g/dL (ref 3.5–5.0)
Alkaline Phosphatase: 51 U/L (ref 38–126)
Anion gap: 6 (ref 5–15)
BUN: 21 mg/dL (ref 8–23)
CO2: 25 mmol/L (ref 22–32)
Calcium: 9.2 mg/dL (ref 8.9–10.3)
Chloride: 107 mmol/L (ref 98–111)
Creatinine, Ser: 1.65 mg/dL — ABNORMAL HIGH (ref 0.61–1.24)
GFR, Estimated: 44 mL/min — ABNORMAL LOW (ref >60)
Glucose, Bld: 139 mg/dL — ABNORMAL HIGH (ref 70–99)
Potassium: 4.6 mmol/L (ref 3.5–5.1)
Sodium: 138 mmol/L (ref 135–145)
Total Bilirubin: 0.5 mg/dL (ref 0.3–1.2)
Total Protein: 7.4 g/dL (ref 6.5–8.1)

## 2022-12-03 LAB — LACTATE DEHYDROGENASE: LDH: 117 U/L (ref 98–192)

## 2022-12-03 LAB — FERRITIN: Ferritin: 322 ng/mL (ref 24–336)

## 2022-12-04 ENCOUNTER — Encounter: Payer: Self-pay | Admitting: Cardiology

## 2022-12-09 DIAGNOSIS — Z79899 Other long term (current) drug therapy: Secondary | ICD-10-CM | POA: Diagnosis not present

## 2022-12-09 DIAGNOSIS — N289 Disorder of kidney and ureter, unspecified: Secondary | ICD-10-CM | POA: Diagnosis not present

## 2022-12-09 DIAGNOSIS — L4050 Arthropathic psoriasis, unspecified: Secondary | ICD-10-CM | POA: Diagnosis not present

## 2022-12-09 DIAGNOSIS — M0579 Rheumatoid arthritis with rheumatoid factor of multiple sites without organ or systems involvement: Secondary | ICD-10-CM | POA: Diagnosis not present

## 2022-12-09 DIAGNOSIS — M5136 Other intervertebral disc degeneration, lumbar region: Secondary | ICD-10-CM | POA: Diagnosis not present

## 2022-12-09 DIAGNOSIS — M109 Gout, unspecified: Secondary | ICD-10-CM | POA: Diagnosis not present

## 2022-12-09 DIAGNOSIS — M1120 Other chondrocalcinosis, unspecified site: Secondary | ICD-10-CM | POA: Diagnosis not present

## 2022-12-09 NOTE — Progress Notes (Unsigned)
[facility] [address], [place] [address]   CLINIC:  Medical Oncology/Hematology  PCP:  Georgianne Fick, MD [address] [place] [address] [phone]   REASON FOR VISIT:  Follow-up for leukocytosis, anemia, unintentional weight loss.     INTERVAL HISTORY:   Kevin Lopez 72 y.o. male returns for routine follow-up of leukocytosis, anemia, and unintentional weight loss.  He was last seen by Rojelio Brenner PA-C on 06/11/2022.   At today's visit, he reports feeling fairly well.  In May 2024, he was admitted to hospital for small bowel obstruction while vacationing in [place].  He denies any other interim hospitalizations, surgeries, or changes in baseline health status.   He continues to deny any B symptoms, lymphadenopathy, or masses.  He receives intermittent steroids for his rheumatoid arthritis, most recently received cortisone injection on 12/09/2022, and is also on Humira.  No bright red blood per rectum, melena, or epistaxis. Chronic fatigue is at baseline.  No pica.  He denies any chest pain or difficulty breathing.  No iron tablets at home, but has previously received iron infusions in the outpatient setting via his nephrologist.  Not taking any EPO injections.  He has not lost any weight since his last visit, but is frustrated that he cannot gain weight "no matter how much he eats."  No changes in bowel, bladder, or breathing patterns.  He has 75% energy and 75% appetite.   ASSESSMENT & PLAN:  1.  Leukocytosis and thrombocytosis - Flow cytometry on 05/12/2019 did not show any monoclonal B-cell population.  Predominance of lymphocytes and nonspecific changes. - JAK2 V617F and reflex testing was negative.  BCR/ABL by FISH was negative.  He has had elevated ESR, but normal CRP. - CBC (12/21/2021) with WBC 20.2, ANC 16.3, monocytes 1.1, platelets 406 (patient had been on oral steroids for pseudogout of hip just prior to  this CBC) - Repeat CBC (02/01/2022) with improved WBC 13.3/ANC 8.5 (received cortisone injection prior to lab draw) - Most recent CBC (12/03/2022): WBC 14.2, lymphocytes 4.1, eosinophils 2.0, basophil 0.2, platelets 238.  LDH normal. - Patient has rheumatoid arthritis, follows with rheumatology, is on Humira - Most recent steroids with cortisone injection on 12/09/2022 via rheumatology - He denies any B symptoms.   - No masses or lymphadenopathy on exam.   - DIFFERENTIAL DIAGNOSIS favors reactive leukocytosis in the setting of rheumatoid arthritis, chronic inflammation, and frequent steroids. - PLAN:  No further work-up needed at this time, we will continue to monitor.   2.  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  Hemoccult negative x3 (August 2023) - Denies any bleeding per rectum or melena - Previously received IV iron in outpatient setting per nephrologist - Hematology panel (12/21/2021): Elevated ferritin 549, normal TIBC and saturation.  Normal B12, MMA, copper, folate.  Normal LDH and reticulocytes.  Negative MGUS/myeloma panel. - CMP consistent with CKD stage IIIa/b - Most recent labs (12/03/2022): Hgb 12.4/MCV 84.6, ferritin 322, iron saturation 33%.  Creatinine 1.65/GFR 44. - DIFFERENTIAL DIAGNOSIS favors anemia secondary to CKD and chronic inflammatory disease - PLAN: No treatment indicated at this time.  We will continue to monitor with repeat CBC, CMP, and iron panel at follow-up visit in 6 months.   3.  Weight loss, resolved - Unexpectedly lost about 20 pounds over the past 1 to 2 years - "Keeps losing weight  no matter how much he eats."  He is drinking protein shake x1 each day. - He reports colonoscopy last year at outside hospital (records not available in EMR).  He reports more frequent diarrhea lately, but otherwise denies any changes in bowel and bladder habits.  He  does have some difficulty swallowing, reports that he had an esophageal stricture stretched last year, but is having recurrent symptoms again.  Over the past month, he has had intermittent epigastric pain that is worse in the morning, relieved with eating and burping.  He denies any changes in his breathing patterns. - He has history of prostate cancer s/p partial prostatectomy in early 2000's. - Normal PSA (1.74) on 12/21/2021.  Normal TSH and T4. - CT abdomen/pelvis (02/03/2022): Small umbilical hernia without any associated findings to suggest bowel obstruction, no evidence of malignant process, other incidental findings discussed in radiology report - Weight today is stable and improved compared to last visit 4 months ago - PLAN: No obvious malignancy to explain unintentional weight loss. -- Will refer to nutritionist   4.  Rheumatoid arthritis - He has been on multiple immunosuppressive medications. - Was on Orencia until July 2023. - Pending approval for Humira   PLAN SUMMARY: >> Labs (CBC/D, CMP, LDH, ferritin, iron/TIBC) in 6 months >> OFFICE visit in 6 months, 1 week after labs      REVIEW OF SYSTEMS:   Review of Systems  Constitutional:  Positive for fatigue. Negative for appetite change, chills, diaphoresis, fever and unexpected weight change.  HENT:   Negative for lump/mass and nosebleeds.   Eyes:  Negative for eye problems.  Respiratory:  Negative for cough, hemoptysis and shortness of breath.   Cardiovascular:  Negative for chest pain, leg swelling and palpitations.  Gastrointestinal:  Negative for abdominal pain (chest tightening and indigestion when he eats), blood in stool, constipation, diarrhea, nausea and vomiting.  Genitourinary:  Positive for frequency. Negative for hematuria.   Musculoskeletal:  Positive for arthralgias.  Skin: Negative.   Neurological:  Negative for dizziness, headaches and light-headedness.  Hematological:  Does not bruise/bleed easily.   Psychiatric/Behavioral:  Positive for sleep disturbance. The patient is nervous/anxious.      PHYSICAL EXAM:  ECOG PERFORMANCE STATUS: 1 - Symptomatic but completely ambulatory  There were no vitals filed for this visit. There were no vitals filed for this visit. Physical Exam Constitutional:      Appearance: Normal appearance. He is normal weight.  Cardiovascular:     Heart sounds: Normal heart sounds.  Pulmonary:     Breath sounds: Normal breath sounds.  Musculoskeletal:     Comments: Nodules and contractures of bilateral hands secondary to rheumatoid arthritis  Neurological:     General: No focal deficit present.     Mental Status: Mental status is at baseline.  Psychiatric:        Behavior: Behavior normal. Behavior is cooperative.     PAST MEDICAL/SURGICAL HISTORY:  Past Medical History:  Diagnosis Date   Chronic kidney disease    Diabetes mellitus without complication (HCC)    Diverticulosis    Hyperlipidemia    Hypertension    RA (rheumatoid arthritis) (HCC)    SBO (small bowel obstruction) s/p ex lap in past 12/15/2015   Past Surgical History:  Procedure Laterality Date   APPENDECTOMY     COLOSTOMY TAKEDOWN     [place]   FLEXIBLE SIGMOIDOSCOPY N/A 12/15/2015   Procedure: FLEXIBLE SIGMOIDOSCOPY;  Surgeon: West Bali, MD;  Location: [facility];  Service: Endoscopy;  Laterality: N/A;   HIP ARTHROPLASTY Left 12/06/2021   Procedure: LEFT HIP ARTHROSCOPY IRRIGATION AND DEBRIDEMENT.;  Surgeon: Huel Cote, MD;  Location: [facility] OR;  Service: Orthopedics;  Laterality: Left;   KNEE SURGERY Right    LEFT COLECTOMY     [place]   LYSIS OF ADHESION     SBO    SOCIAL HISTORY:  Social History   Socioeconomic History   Marital status: Divorced    Spouse name: Not on file   Number of children: 3   Years of education: Not on file   Highest education level: Not on file  Occupational History   Occupation: retired  Tobacco Use   Smoking status: Never   Smokeless  tobacco: Never  Vaping Use   Vaping status: Never Used  Substance and Sexual Activity   Alcohol use: Yes    Comment: occ.    Drug use: No   Sexual activity: Not on file  Other Topics Concern   Not on file  Social History Narrative   Not on file   Social Determinants of Health   Financial Resource Strain: High Risk (04/18/2020)   Overall Financial Resource Strain (CARDIA)    Difficulty of Paying Living Expenses: Very hard  Food Insecurity: Food Insecurity Present (04/18/2020)   Hunger Vital Sign    Worried About Running Out of Food in the Last Year: Sometimes true    Ran Out of Food in the Last Year: Sometimes true  Transportation Needs: No Transportation Needs (04/18/2020)   PRAPARE - Administrator, Civil Service (Medical): No    Lack of Transportation (Non-Medical): No  Physical Activity: Insufficiently Active (04/18/2020)   Exercise Vital Sign    Days of Exercise per Week: 2 days    Minutes of Exercise per Session: 20 min  Stress: Stress Concern Present (04/18/2020)   [employer] of Occupational Health - Occupational Stress Questionnaire    Feeling of Stress : Rather much  Social Connections: Moderately Isolated (04/18/2020)   Social Connection and Isolation Panel [NHANES]    Frequency of Communication with Friends and Family: More than three times a week    Frequency of Social Gatherings with Friends and Family: Once a week    Attends Religious Services: More than 4 times per year    Active Member of [employer] or Organizations: No    Attends Banker Meetings: Never    Marital Status: Divorced  Catering manager Violence: Not At Risk (04/18/2020)   Humiliation, Afraid, Rape, and Kick questionnaire    Fear of Current or Ex-Partner: No    Emotionally Abused: No    Physically Abused: No    Sexually Abused: No    FAMILY HISTORY:  Family History  Problem Relation Age of Onset   Cancer Mother    Heart attack Father    Hypertension Sister     Hypertension Brother     CURRENT MEDICATIONS:  Outpatient Encounter Medications as of 12/10/2022  Medication Sig   acetaminophen (TYLENOL) 500 MG tablet Take 500 mg by mouth every 6 (six) hours as needed for moderate pain.   amLODipine (NORVASC) 10 MG tablet Take 10 mg by mouth daily.   Ascorbic Acid (VITAMIN C) 1000 MG tablet Take 1,000 mg by mouth daily.   carvedilol (COREG) 12.5 MG tablet Take 12.5 mg by mouth 2 (two) times daily with a meal.   clonazePAM (KLONOPIN) 0.5 MG tablet Take 0.5 mg by mouth at bedtime.   Cyanocobalamin (B-12  PO) Take 1 capsule by mouth daily.   dapagliflozin propanediol (FARXIGA) 10 MG TABS tablet Take 10 mg by mouth daily.   ergocalciferol (VITAMIN D2) 1.25 MG (50000 UT) capsule Take 50,000 Units by mouth once a week.   fluticasone (FLONASE) 50 MCG/ACT nasal spray Place 1 spray into both nostrils daily as needed for allergies.   HUMIRA PEN 40 MG/0.4ML PNKT SMARTSIG:40 Milligram(s) SUB-Q Every 2 Weeks   hydrALAZINE (APRESOLINE) 25 MG tablet Take 25 mg by mouth in the morning and at bedtime.   hydrOXYzine (ATARAX/VISTARIL) 25 MG tablet Take 25 mg by mouth daily as needed for anxiety.   linagliptin (TRADJENTA) 5 MG TABS tablet Take 5 mg by mouth daily.   lovastatin (MEVACOR) 20 MG tablet Take 20 mg by mouth daily at 12 noon.   Multiple Vitamin (MULTIVITAMIN WITH MINERALS) TABS tablet Take 1 tablet by mouth daily.   traZODone (DESYREL) 50 MG tablet Take 50-100 mg by mouth at bedtime as needed.   No facility-administered encounter medications on file as of 12/10/2022.    ALLERGIES:  Allergies  Allergen Reactions   Latex Shortness Of Breath   Irbesartan Hives    swelling    Metformin Diarrhea, Nausea And Vomiting and Swelling    LABORATORY DATA:  I have reviewed the labs as listed.  CBC    Component Value Date/Time   WBC 14.2 (H) 12/03/2022 1324   RBC 4.62 12/03/2022 1324   HGB 12.4 (L) 12/03/2022 1324   HGB 10.2 (L) 07/31/2021 1426   HCT 39.1  12/03/2022 1324   HCT 31.8 (L) 07/31/2021 1426   PLT 238 12/03/2022 1324   MCV 84.6 12/03/2022 1324   MCH 26.8 12/03/2022 1324   MCHC 31.7 12/03/2022 1324   RDW 14.8 12/03/2022 1324   LYMPHSABS 4.1 (H) 12/03/2022 1324   MONOABS 0.7 12/03/2022 1324   EOSABS 2.0 (H) 12/03/2022 1324   BASOSABS 0.2 (H) 12/03/2022 1324      Latest Ref Rng & Units 12/03/2022    1:24 PM 06/03/2022    1:13 PM 12/21/2021   12:13 PM  CMP  Glucose 70 - 99 mg/dL 409  811  914   BUN 8 - 23 mg/dL 21  33  29   Creatinine 0.61 - 1.24 mg/dL 7.82  9.56  2.13   Sodium 135 - 145 mmol/L 138  134  138   Potassium 3.5 - 5.1 mmol/L 4.6  4.5  4.5   Chloride 98 - 111 mmol/L 107  102  104   CO2 22 - 32 mmol/L 25  24  25    Calcium 8.9 - 10.3 mg/dL 9.2  9.0  9.6   Total Protein 6.5 - 8.1 g/dL 7.4  7.6  7.6   Total Bilirubin 0.3 - 1.2 mg/dL 0.5  0.4  0.4   Alkaline Phos 38 - 126 U/L 51  47  62   AST 15 - 41 U/L 19  21  13    ALT 0 - 44 U/L 17  21  27      DIAGNOSTIC IMAGING:  I have independently reviewed the relevant imaging and discussed with the patient.   WRAP UP:  All questions were answered. The patient knows to call the clinic with any problems, questions or concerns.  Medical decision making: Moderate  Time spent on visit: I spent 20 minutes counseling the patient face to face. The total time spent in the appointment was 30 minutes and more than 50% was on counseling.  Carnella Guadalajara,  PA-C  12/10/22 2:46 PM

## 2022-12-10 ENCOUNTER — Inpatient Hospital Stay (HOSPITAL_BASED_OUTPATIENT_CLINIC_OR_DEPARTMENT_OTHER): Payer: Medicare Other | Admitting: Physician Assistant

## 2022-12-10 VITALS — BP 150/84 | HR 58 | Temp 98.8°F | Resp 16 | Wt 158.5 lb

## 2022-12-10 DIAGNOSIS — M069 Rheumatoid arthritis, unspecified: Secondary | ICD-10-CM | POA: Diagnosis not present

## 2022-12-10 DIAGNOSIS — N183 Chronic kidney disease, stage 3 unspecified: Secondary | ICD-10-CM

## 2022-12-10 DIAGNOSIS — K56609 Unspecified intestinal obstruction, unspecified as to partial versus complete obstruction: Secondary | ICD-10-CM | POA: Diagnosis not present

## 2022-12-10 DIAGNOSIS — D631 Anemia in chronic kidney disease: Secondary | ICD-10-CM | POA: Diagnosis not present

## 2022-12-10 DIAGNOSIS — D649 Anemia, unspecified: Secondary | ICD-10-CM | POA: Diagnosis not present

## 2022-12-10 DIAGNOSIS — D72829 Elevated white blood cell count, unspecified: Secondary | ICD-10-CM

## 2022-12-10 DIAGNOSIS — D75839 Thrombocytosis, unspecified: Secondary | ICD-10-CM | POA: Diagnosis not present

## 2022-12-10 DIAGNOSIS — R634 Abnormal weight loss: Secondary | ICD-10-CM | POA: Diagnosis not present

## 2022-12-10 DIAGNOSIS — N2581 Secondary hyperparathyroidism of renal origin: Secondary | ICD-10-CM | POA: Diagnosis not present

## 2022-12-10 NOTE — Patient Instructions (Signed)
[facility] **    You were seen today by Tarri Abernethy PA-C for your anemia and elevated white blood cells.     ANEMIA: This is related to your chronic kidney disease and chronic inflammation from rheumatoid arthritis.  Your blood levels are currently within satisfactory range.  You do not need any additional treatment for anemia at this time.   LEUKOCYTOSIS: Your white blood cells are elevated due to chronic inflammation and intermittent steroid medications.  You do not need any additional testing or treatment for elevated white blood cells at this time.   LABS: Return in 6 months for repeat labs   FOLLOW-UP APPOINTMENT: Office visit in 6 months   ** Thank you for trusting me with your healthcare!  I strive to provide all of my patients with quality care at each visit.  If you receive a survey for this visit, I would be so grateful to you for taking the time to provide feedback.  Thank you in advance!  ~ Rebekah                   Dr. Derek Jack   &   Tarri Abernethy, PA-C   - - - - - - - - - - - - - - - - - -    Thank you for choosing [facility] to provide your oncology and hematology care.  To afford each patient quality time with our provider, please arrive at least 15 minutes before your scheduled appointment time.   If you have a lab appointment with the [facility] please come in thru the Main Entrance and check in at the main information desk.  You need to re-schedule your appointment should you arrive 10 or more minutes late.  We strive to give you quality time with our providers, and arriving late affects you and other patients whose appointments are after yours.  Also, if you no show three or more times for appointments you may be dismissed from the clinic at the providers discretion.     Again, thank you for choosing [facility].  Our  hope is that these requests will decrease the amount of time that you wait before being seen by our physicians.       _____________________________________________________________  Should you have questions after your visit to [facility], please contact our office at [phone] and follow the prompts.  Our office hours are 8:00 a.m. and 4:30 p.m. Monday - Friday.  Please note that voicemails left after 4:00 p.m. may not be returned until the following business day.  We are closed weekends and major holidays.  You do have access to a nurse 24-7, just call the main number to the clinic [phone] and do not press any options, hold on the line and a nurse will answer the phone.    For prescription refill requests, have your pharmacy contact our office and allow 72 hours.

## 2022-12-16 DIAGNOSIS — L732 Hidradenitis suppurativa: Secondary | ICD-10-CM | POA: Diagnosis not present

## 2022-12-19 DIAGNOSIS — N183 Chronic kidney disease, stage 3 unspecified: Secondary | ICD-10-CM | POA: Diagnosis not present

## 2022-12-19 DIAGNOSIS — N2581 Secondary hyperparathyroidism of renal origin: Secondary | ICD-10-CM | POA: Diagnosis not present

## 2022-12-19 DIAGNOSIS — D631 Anemia in chronic kidney disease: Secondary | ICD-10-CM | POA: Diagnosis not present

## 2022-12-19 DIAGNOSIS — I129 Hypertensive chronic kidney disease with stage 1 through stage 4 chronic kidney disease, or unspecified chronic kidney disease: Secondary | ICD-10-CM | POA: Diagnosis not present

## 2023-01-01 ENCOUNTER — Encounter (HOSPITAL_COMMUNITY): Payer: Self-pay | Admitting: Hematology

## 2023-01-01 ENCOUNTER — Ambulatory Visit: Payer: Medicare Other

## 2023-01-01 DIAGNOSIS — I10 Essential (primary) hypertension: Secondary | ICD-10-CM

## 2023-01-01 DIAGNOSIS — I700 Atherosclerosis of aorta: Secondary | ICD-10-CM | POA: Diagnosis not present

## 2023-01-02 DIAGNOSIS — L538 Other specified erythematous conditions: Secondary | ICD-10-CM | POA: Diagnosis not present

## 2023-01-02 DIAGNOSIS — Z789 Other specified health status: Secondary | ICD-10-CM | POA: Diagnosis not present

## 2023-01-02 DIAGNOSIS — L732 Hidradenitis suppurativa: Secondary | ICD-10-CM | POA: Diagnosis not present

## 2023-01-02 DIAGNOSIS — L298 Other pruritus: Secondary | ICD-10-CM | POA: Diagnosis not present

## 2023-01-02 DIAGNOSIS — R208 Other disturbances of skin sensation: Secondary | ICD-10-CM | POA: Diagnosis not present

## 2023-01-06 NOTE — Progress Notes (Signed)
Patient wants to know if no evidence of renal artery stenosis was found why is he going to a kidney specialist.

## 2023-01-06 NOTE — Progress Notes (Signed)
Called patient and LVM.

## 2023-01-09 DIAGNOSIS — E119 Type 2 diabetes mellitus without complications: Secondary | ICD-10-CM | POA: Diagnosis not present

## 2023-01-09 DIAGNOSIS — H524 Presbyopia: Secondary | ICD-10-CM | POA: Diagnosis not present

## 2023-01-14 IMAGING — US US RENAL
1 series · 14 of 25 positions shown · non-contrast
Comparison: None.

CLINICAL DATA: Chronic renal disease, hypertension

EXAM:
RENAL / URINARY TRACT ULTRASOUND COMPLETE

[Series 1: us renal · 0.22mm/px · 14 of 41 slices shown]
[im 1/41]
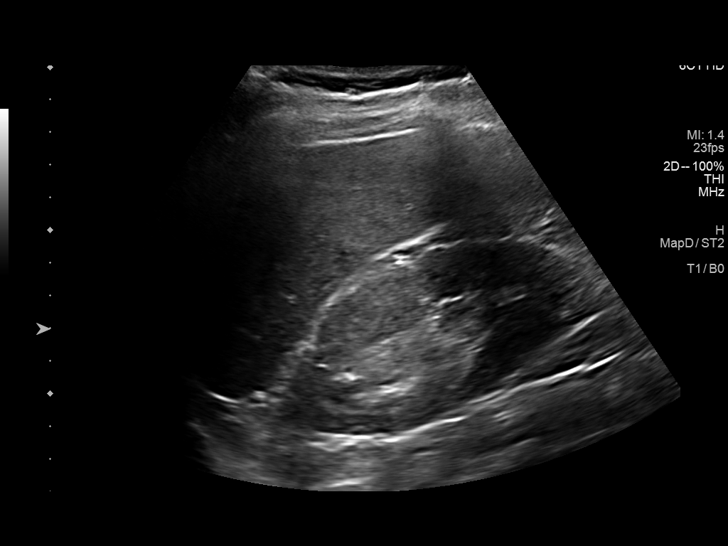
[im 4/41]
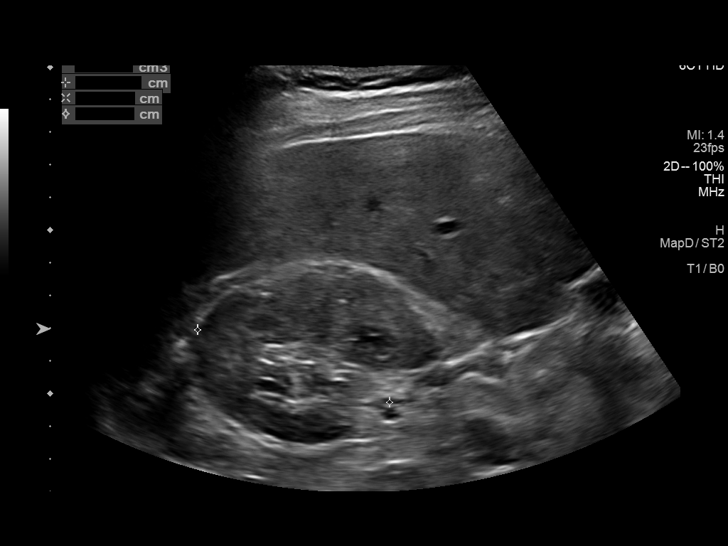
[im 7/41]
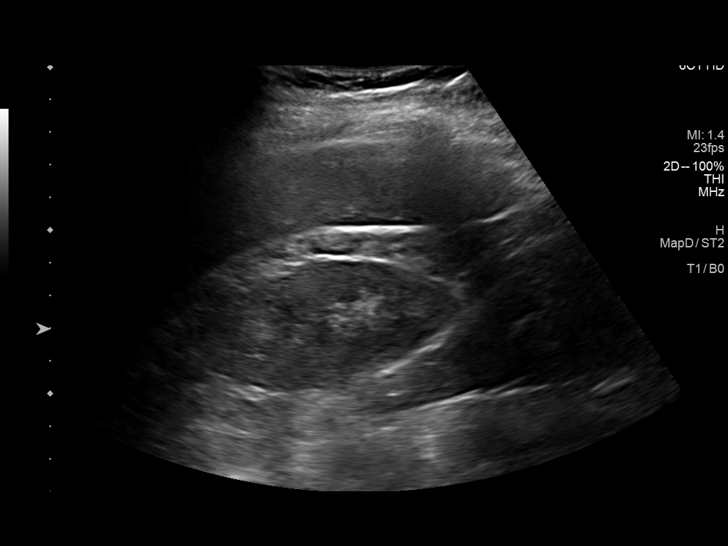
[im 11/41]
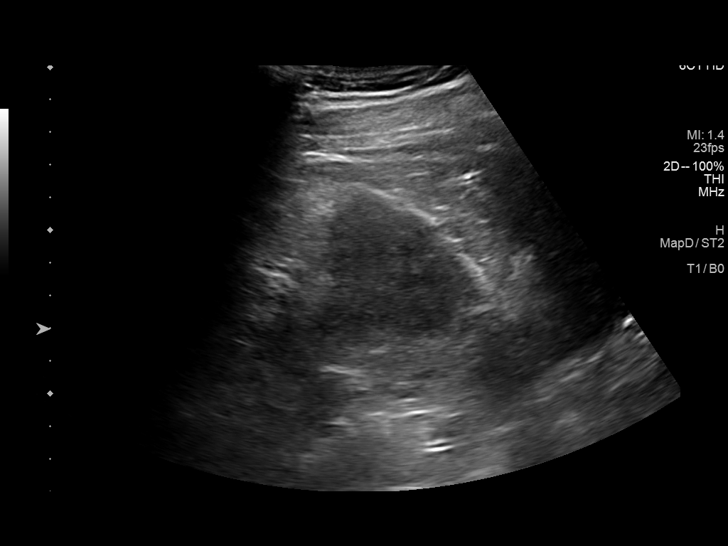
[im 14/41]
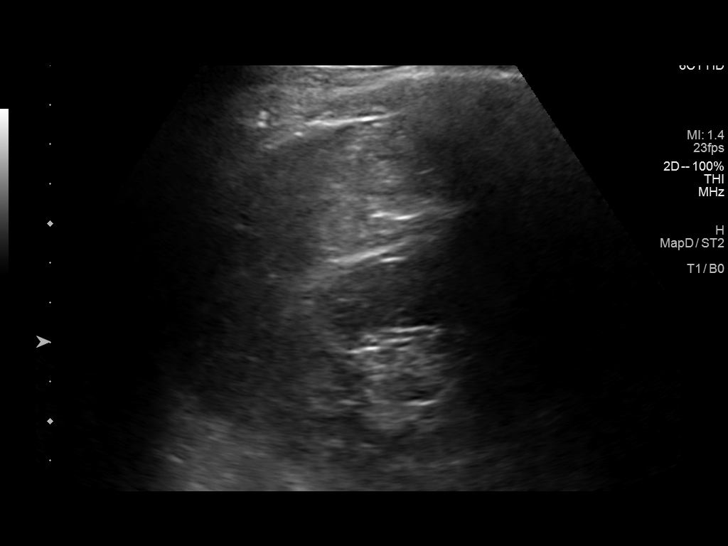
[im 16/41]
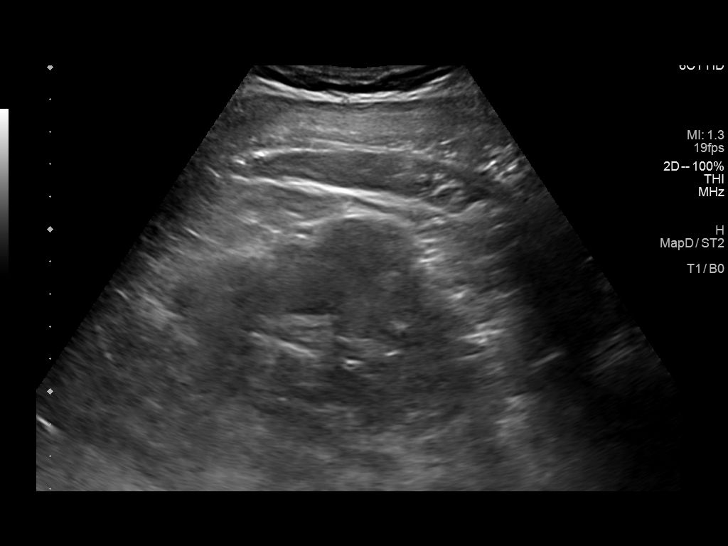
[im 19/41]
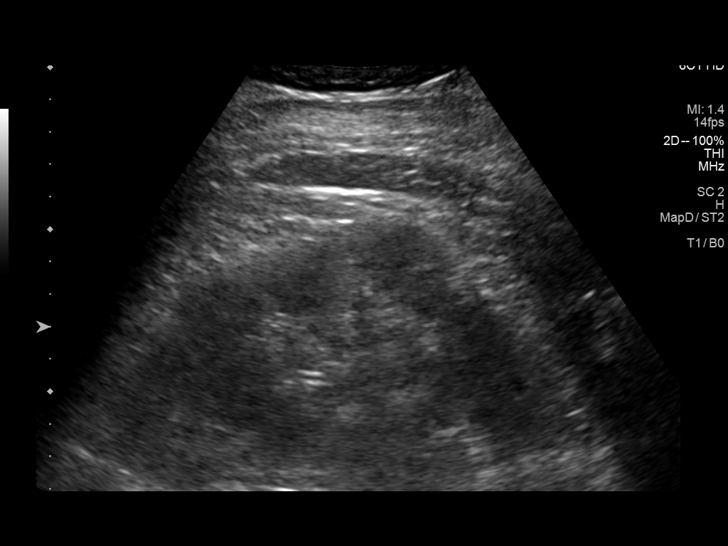
[im 22/41]
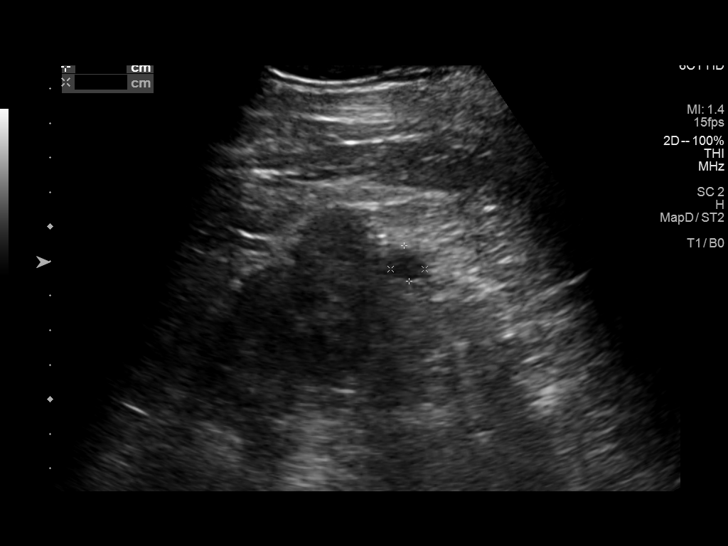
[im 26/41]
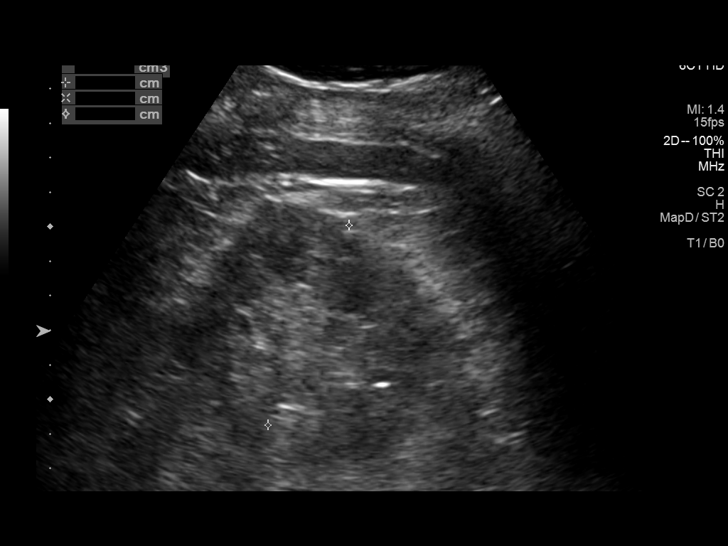
[im 27/41]
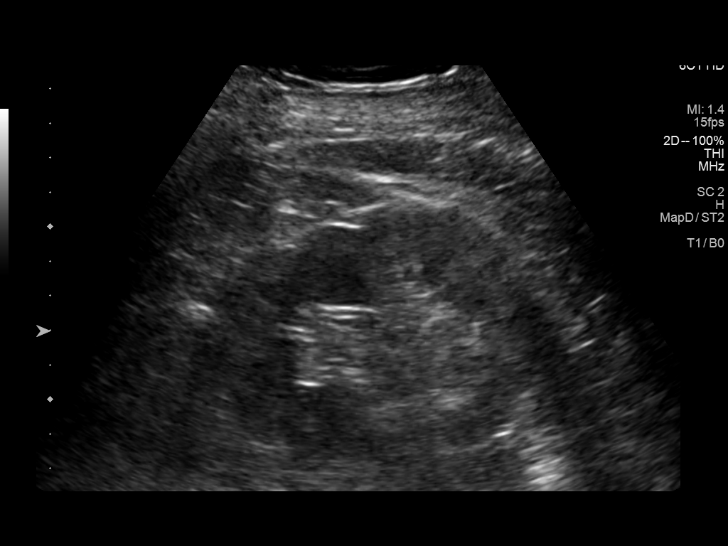
[im 31/41]
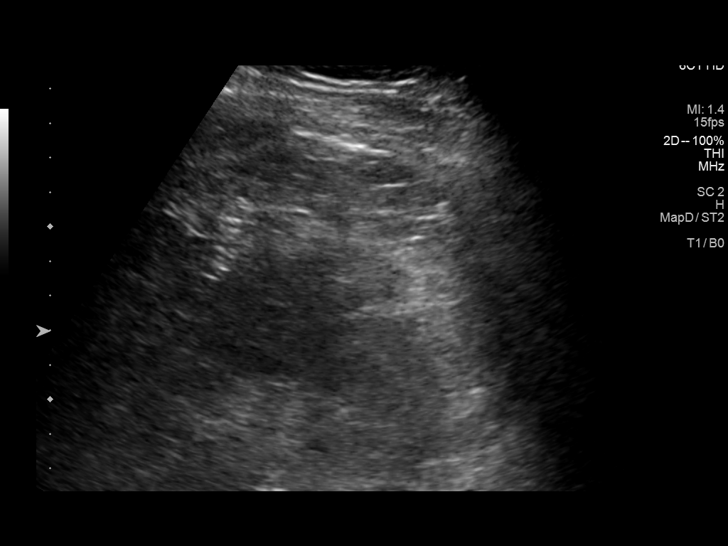
[im 34/41]
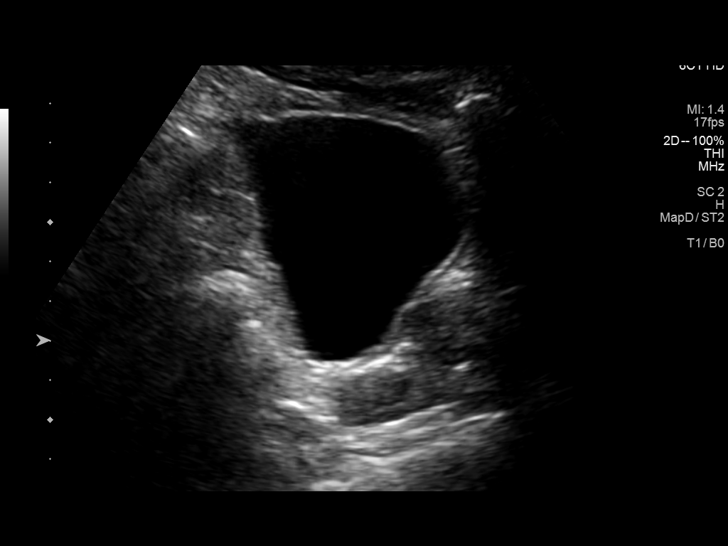
[im 37/41]
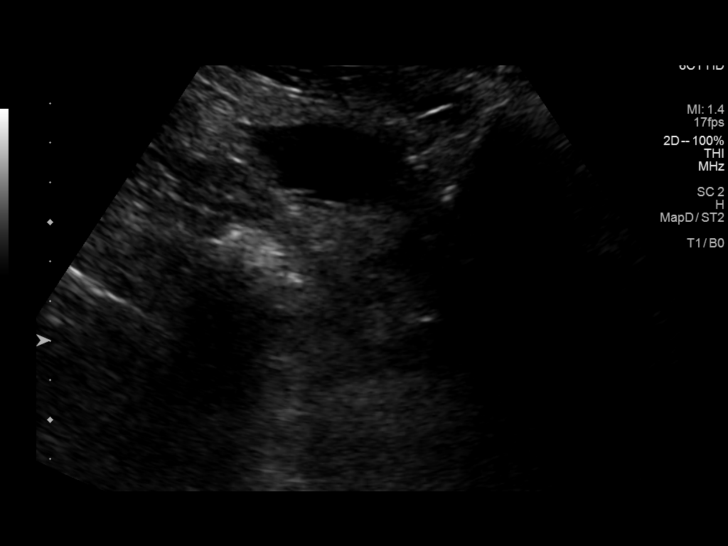
[im 41/41]
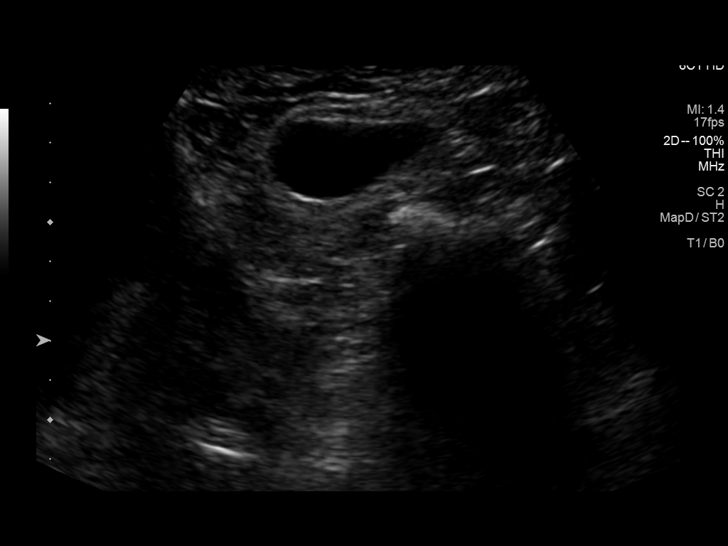

[14 of 25 positions shown; findings below may reference images not displayed]

FINDINGS: Right Kidney:

Renal measurements: 10.8 x 5.0 x 6.3 cm. = volume: 179 mL. Mild
increased echogenicity is noted. No mass or hydronephrosis is noted.

Left Kidney:

Renal measurements: 9.9 x 6.8 x 6.2 cm. = volume: 218 mL. 1.3 cm
cyst is noted in the lower pole. No mass lesion or hydronephrosis is
noted. Mild increased echogenicity is noted.

Bladder:

Appears normal for degree of bladder distention.

Other:

None.
IMPRESSION: Mild increased echogenicity consistent with medical renal disease.

Small left lower pole renal cyst.

## 2023-01-15 NOTE — Progress Notes (Signed)
I called patient and explained the results and also explained why he was to continue seeing nephrology. Patient voiced understanding.

## 2023-03-07 DIAGNOSIS — N401 Enlarged prostate with lower urinary tract symptoms: Secondary | ICD-10-CM | POA: Diagnosis not present

## 2023-03-07 DIAGNOSIS — Z125 Encounter for screening for malignant neoplasm of prostate: Secondary | ICD-10-CM | POA: Diagnosis not present

## 2023-03-11 ENCOUNTER — Encounter (HOSPITAL_COMMUNITY): Payer: Self-pay | Admitting: Hematology

## 2023-03-18 ENCOUNTER — Ambulatory Visit: Payer: Self-pay | Admitting: Cardiology

## 2023-03-19 DIAGNOSIS — N529 Male erectile dysfunction, unspecified: Secondary | ICD-10-CM | POA: Diagnosis not present

## 2023-03-19 DIAGNOSIS — L732 Hidradenitis suppurativa: Secondary | ICD-10-CM | POA: Diagnosis not present

## 2023-03-19 DIAGNOSIS — R7309 Other abnormal glucose: Secondary | ICD-10-CM | POA: Diagnosis not present

## 2023-03-19 DIAGNOSIS — N35919 Unspecified urethral stricture, male, unspecified site: Secondary | ICD-10-CM | POA: Diagnosis not present

## 2023-03-19 DIAGNOSIS — R81 Glycosuria: Secondary | ICD-10-CM | POA: Diagnosis not present

## 2023-03-19 DIAGNOSIS — R3915 Urgency of urination: Secondary | ICD-10-CM | POA: Diagnosis not present

## 2023-03-19 DIAGNOSIS — N401 Enlarged prostate with lower urinary tract symptoms: Secondary | ICD-10-CM | POA: Diagnosis not present

## 2023-03-19 DIAGNOSIS — R351 Nocturia: Secondary | ICD-10-CM | POA: Diagnosis not present

## 2023-03-19 DIAGNOSIS — E291 Testicular hypofunction: Secondary | ICD-10-CM | POA: Diagnosis not present

## 2023-03-19 DIAGNOSIS — Z125 Encounter for screening for malignant neoplasm of prostate: Secondary | ICD-10-CM | POA: Diagnosis not present

## 2023-04-10 DIAGNOSIS — L309 Dermatitis, unspecified: Secondary | ICD-10-CM | POA: Diagnosis not present

## 2023-04-14 DIAGNOSIS — E1122 Type 2 diabetes mellitus with diabetic chronic kidney disease: Secondary | ICD-10-CM | POA: Diagnosis not present

## 2023-04-14 DIAGNOSIS — N182 Chronic kidney disease, stage 2 (mild): Secondary | ICD-10-CM | POA: Diagnosis not present

## 2023-04-14 DIAGNOSIS — E782 Mixed hyperlipidemia: Secondary | ICD-10-CM | POA: Diagnosis not present

## 2023-04-14 DIAGNOSIS — I129 Hypertensive chronic kidney disease with stage 1 through stage 4 chronic kidney disease, or unspecified chronic kidney disease: Secondary | ICD-10-CM | POA: Diagnosis not present

## 2023-04-21 DIAGNOSIS — N1832 Chronic kidney disease, stage 3b: Secondary | ICD-10-CM | POA: Diagnosis not present

## 2023-04-21 DIAGNOSIS — E782 Mixed hyperlipidemia: Secondary | ICD-10-CM | POA: Diagnosis not present

## 2023-04-21 DIAGNOSIS — E1129 Type 2 diabetes mellitus with other diabetic kidney complication: Secondary | ICD-10-CM | POA: Diagnosis not present

## 2023-04-21 DIAGNOSIS — Z23 Encounter for immunization: Secondary | ICD-10-CM | POA: Diagnosis not present

## 2023-04-21 DIAGNOSIS — R809 Proteinuria, unspecified: Secondary | ICD-10-CM | POA: Diagnosis not present

## 2023-05-01 DIAGNOSIS — E1122 Type 2 diabetes mellitus with diabetic chronic kidney disease: Secondary | ICD-10-CM | POA: Diagnosis not present

## 2023-05-01 DIAGNOSIS — E782 Mixed hyperlipidemia: Secondary | ICD-10-CM | POA: Diagnosis not present

## 2023-05-01 DIAGNOSIS — I129 Hypertensive chronic kidney disease with stage 1 through stage 4 chronic kidney disease, or unspecified chronic kidney disease: Secondary | ICD-10-CM | POA: Diagnosis not present

## 2023-05-07 DIAGNOSIS — L308 Other specified dermatitis: Secondary | ICD-10-CM | POA: Diagnosis not present

## 2023-05-07 DIAGNOSIS — L2989 Other pruritus: Secondary | ICD-10-CM | POA: Diagnosis not present

## 2023-05-07 DIAGNOSIS — L309 Dermatitis, unspecified: Secondary | ICD-10-CM | POA: Diagnosis not present

## 2023-05-26 DIAGNOSIS — L2089 Other atopic dermatitis: Secondary | ICD-10-CM | POA: Diagnosis not present

## 2023-05-26 DIAGNOSIS — I129 Hypertensive chronic kidney disease with stage 1 through stage 4 chronic kidney disease, or unspecified chronic kidney disease: Secondary | ICD-10-CM | POA: Diagnosis not present

## 2023-06-03 DIAGNOSIS — M51369 Other intervertebral disc degeneration, lumbar region without mention of lumbar back pain or lower extremity pain: Secondary | ICD-10-CM | POA: Diagnosis not present

## 2023-06-03 DIAGNOSIS — N289 Disorder of kidney and ureter, unspecified: Secondary | ICD-10-CM | POA: Diagnosis not present

## 2023-06-03 DIAGNOSIS — M109 Gout, unspecified: Secondary | ICD-10-CM | POA: Diagnosis not present

## 2023-06-03 DIAGNOSIS — M1120 Other chondrocalcinosis, unspecified site: Secondary | ICD-10-CM | POA: Diagnosis not present

## 2023-06-03 DIAGNOSIS — L4050 Arthropathic psoriasis, unspecified: Secondary | ICD-10-CM | POA: Diagnosis not present

## 2023-06-03 DIAGNOSIS — Z79899 Other long term (current) drug therapy: Secondary | ICD-10-CM | POA: Diagnosis not present

## 2023-06-03 DIAGNOSIS — N1832 Chronic kidney disease, stage 3b: Secondary | ICD-10-CM | POA: Diagnosis not present

## 2023-06-03 DIAGNOSIS — E1122 Type 2 diabetes mellitus with diabetic chronic kidney disease: Secondary | ICD-10-CM | POA: Diagnosis not present

## 2023-06-03 DIAGNOSIS — M0579 Rheumatoid arthritis with rheumatoid factor of multiple sites without organ or systems involvement: Secondary | ICD-10-CM | POA: Diagnosis not present

## 2023-06-11 ENCOUNTER — Inpatient Hospital Stay: Payer: Medicare Other

## 2023-06-11 ENCOUNTER — Encounter: Payer: Self-pay | Admitting: Cardiology

## 2023-06-11 ENCOUNTER — Inpatient Hospital Stay: Payer: Medicare Other | Attending: Hematology

## 2023-06-11 ENCOUNTER — Ambulatory Visit: Payer: Medicare Other | Attending: Cardiology | Admitting: Cardiology

## 2023-06-11 VITALS — BP 140/72 | HR 53 | Resp 16 | Ht 69.0 in | Wt 160.4 lb

## 2023-06-11 DIAGNOSIS — R634 Abnormal weight loss: Secondary | ICD-10-CM | POA: Insufficient documentation

## 2023-06-11 DIAGNOSIS — I129 Hypertensive chronic kidney disease with stage 1 through stage 4 chronic kidney disease, or unspecified chronic kidney disease: Secondary | ICD-10-CM | POA: Diagnosis not present

## 2023-06-11 DIAGNOSIS — E1169 Type 2 diabetes mellitus with other specified complication: Secondary | ICD-10-CM | POA: Insufficient documentation

## 2023-06-11 DIAGNOSIS — M069 Rheumatoid arthritis, unspecified: Secondary | ICD-10-CM | POA: Insufficient documentation

## 2023-06-11 DIAGNOSIS — Z881 Allergy status to other antibiotic agents status: Secondary | ICD-10-CM | POA: Diagnosis not present

## 2023-06-11 DIAGNOSIS — Z809 Family history of malignant neoplasm, unspecified: Secondary | ICD-10-CM | POA: Insufficient documentation

## 2023-06-11 DIAGNOSIS — D72829 Elevated white blood cell count, unspecified: Secondary | ICD-10-CM | POA: Insufficient documentation

## 2023-06-11 DIAGNOSIS — R197 Diarrhea, unspecified: Secondary | ICD-10-CM | POA: Insufficient documentation

## 2023-06-11 DIAGNOSIS — N183 Chronic kidney disease, stage 3 unspecified: Secondary | ICD-10-CM | POA: Diagnosis not present

## 2023-06-11 DIAGNOSIS — Z9049 Acquired absence of other specified parts of digestive tract: Secondary | ICD-10-CM | POA: Diagnosis not present

## 2023-06-11 DIAGNOSIS — Z8249 Family history of ischemic heart disease and other diseases of the circulatory system: Secondary | ICD-10-CM | POA: Insufficient documentation

## 2023-06-11 DIAGNOSIS — E785 Hyperlipidemia, unspecified: Secondary | ICD-10-CM | POA: Diagnosis not present

## 2023-06-11 DIAGNOSIS — M2550 Pain in unspecified joint: Secondary | ICD-10-CM | POA: Insufficient documentation

## 2023-06-11 DIAGNOSIS — R1310 Dysphagia, unspecified: Secondary | ICD-10-CM | POA: Diagnosis not present

## 2023-06-11 DIAGNOSIS — R0602 Shortness of breath: Secondary | ICD-10-CM | POA: Insufficient documentation

## 2023-06-11 DIAGNOSIS — R350 Frequency of micturition: Secondary | ICD-10-CM | POA: Insufficient documentation

## 2023-06-11 DIAGNOSIS — Z8546 Personal history of malignant neoplasm of prostate: Secondary | ICD-10-CM | POA: Insufficient documentation

## 2023-06-11 DIAGNOSIS — Z79899 Other long term (current) drug therapy: Secondary | ICD-10-CM | POA: Diagnosis not present

## 2023-06-11 DIAGNOSIS — D649 Anemia, unspecified: Secondary | ICD-10-CM | POA: Diagnosis not present

## 2023-06-11 DIAGNOSIS — N1831 Chronic kidney disease, stage 3a: Secondary | ICD-10-CM | POA: Diagnosis not present

## 2023-06-11 DIAGNOSIS — Z5986 Financial insecurity: Secondary | ICD-10-CM | POA: Diagnosis not present

## 2023-06-11 DIAGNOSIS — R5383 Other fatigue: Secondary | ICD-10-CM | POA: Insufficient documentation

## 2023-06-11 DIAGNOSIS — G479 Sleep disorder, unspecified: Secondary | ICD-10-CM | POA: Diagnosis not present

## 2023-06-11 DIAGNOSIS — D75839 Thrombocytosis, unspecified: Secondary | ICD-10-CM | POA: Diagnosis not present

## 2023-06-11 DIAGNOSIS — E1122 Type 2 diabetes mellitus with diabetic chronic kidney disease: Secondary | ICD-10-CM | POA: Insufficient documentation

## 2023-06-11 LAB — CBC WITH DIFFERENTIAL/PLATELET
Abs Immature Granulocytes: 0.09 10*3/uL — ABNORMAL HIGH (ref 0.00–0.07)
Basophils Absolute: 0.2 10*3/uL — ABNORMAL HIGH (ref 0.0–0.1)
Basophils Relative: 1 %
Eosinophils Absolute: 1.6 10*3/uL — ABNORMAL HIGH (ref 0.0–0.5)
Eosinophils Relative: 10 %
HCT: 37 % — ABNORMAL LOW (ref 39.0–52.0)
Hemoglobin: 11.5 g/dL — ABNORMAL LOW (ref 13.0–17.0)
Immature Granulocytes: 1 %
Lymphocytes Relative: 43 %
Lymphs Abs: 6.4 10*3/uL — ABNORMAL HIGH (ref 0.7–4.0)
MCH: 26.4 pg (ref 26.0–34.0)
MCHC: 31.1 g/dL (ref 30.0–36.0)
MCV: 85.1 fL (ref 80.0–100.0)
Monocytes Absolute: 0.7 10*3/uL (ref 0.1–1.0)
Monocytes Relative: 5 %
Neutro Abs: 6.1 10*3/uL (ref 1.7–7.7)
Neutrophils Relative %: 40 %
Platelets: 305 10*3/uL (ref 150–400)
RBC: 4.35 MIL/uL (ref 4.22–5.81)
RDW: 14.6 % (ref 11.5–15.5)
WBC: 15 10*3/uL — ABNORMAL HIGH (ref 4.0–10.5)
nRBC: 0 % (ref 0.0–0.2)

## 2023-06-11 LAB — FERRITIN: Ferritin: 351 ng/mL — ABNORMAL HIGH (ref 24–336)

## 2023-06-11 LAB — COMPREHENSIVE METABOLIC PANEL
ALT: 29 U/L (ref 0–44)
AST: 30 U/L (ref 15–41)
Albumin: 4 g/dL (ref 3.5–5.0)
Alkaline Phosphatase: 54 U/L (ref 38–126)
Anion gap: 10 (ref 5–15)
BUN: 29 mg/dL — ABNORMAL HIGH (ref 8–23)
CO2: 23 mmol/L (ref 22–32)
Calcium: 9.3 mg/dL (ref 8.9–10.3)
Chloride: 105 mmol/L (ref 98–111)
Creatinine, Ser: 1.58 mg/dL — ABNORMAL HIGH (ref 0.61–1.24)
GFR, Estimated: 46 mL/min — ABNORMAL LOW (ref >60)
Glucose, Bld: 140 mg/dL — ABNORMAL HIGH (ref 70–99)
Potassium: 4.2 mmol/L (ref 3.5–5.1)
Sodium: 138 mmol/L (ref 135–145)
Total Bilirubin: 0.5 mg/dL (ref 0.0–1.2)
Total Protein: 7.3 g/dL (ref 6.5–8.1)

## 2023-06-11 LAB — IRON AND TIBC
Iron: 91 ug/dL (ref 45–182)
Saturation Ratios: 30 % (ref 17.9–39.5)
TIBC: 306 ug/dL (ref 250–450)
UIBC: 215 ug/dL

## 2023-06-11 LAB — LACTATE DEHYDROGENASE: LDH: 125 U/L (ref 98–192)

## 2023-06-11 NOTE — Patient Instructions (Signed)
Medication Instructions:  None   *If you need a refill on your cardiac medications before your next appointment, please call your pharmacy*   Lab Work: None  If you have labs (blood work) drawn today and your tests are completely normal, you will receive your results only by: MyChart Message (if you have MyChart) OR A paper copy in the mail If you have any lab test that is abnormal or we need to change your treatment, we will call you to review the results.   Testing/Procedures: None    Follow-Up: At [facility], you and your health needs are our priority.  As part of our continuing mission to provide you with exceptional heart care, we have created designated Provider Care Teams.  These Care Teams include your primary Cardiologist (physician) and Advanced Practice Providers (APPs -  Physician Assistants and Nurse Practitioners) who all work together to provide you with the care you need, when you need it.  We recommend signing up for the patient portal called "MyChart".  Sign up information is provided on this After Visit Summary.  MyChart is used to connect with patients for Virtual Visits (Telemedicine).  Patients are able to view lab/test results, encounter notes, upcoming appointments, etc.  Non-urgent messages can be sent to your provider as well.   To learn more about what you can do with MyChart, go to [email or web address].    Your next appointment:   As needed   Provider:   Tessa Lerner, DO

## 2023-06-11 NOTE — Progress Notes (Signed)
Cardiology Office Note:  .   Date:  06/11/2023  ID:  [name], DOB 10/28/1950, MRN [id number] PCP:  Georgianne Fick, MD  Former Cardiology Providers: N/A Nephrologist: Dr. Kathe Mar[facility] Providers Cardiologist:  Tessa Lerner, DO , [facility] (established care March 2023) Electrophysiologist:  None  Click to update primary MD,subspecialty MD or APP then REFRESH:1}    Chief Complaint  Patient presents with   Shortness of Breath   Follow-up    History of Present Illness: .   Unknown Kevin Lopez is a 73 y.o. 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   Patient was initially referred to the practice for shortness of breath back in March 2023.  Given his risk factors he did undergo ischemic workup and the results were favorable.  It was felt that the shortness of breath is likely secondary to uncontrolled hypertension.  With medication titration his blood pressures have improved and symptoms have also added.  Telmisartan in the past was discontinued secondary to hyperkalemia.  And he has remained reluctant to be on ACE inhibitors or ARB.  Patient presents today for follow-up.  Since last office visit he denies any anginal chest pain, shortness of breath, or heart failure symptoms.  Patient is concerned that his blood pressures at home are not well-controlled.  He brings in his blood pressure log which has been scanned into the media section on visual estimation SBP's ranging between 130-140 mmHg.  He he is working with his PCP with regards to up titration of medical therapy.  His hydralazine was changed from twice daily dosing to 3 times daily dosing less than a week ago according to the patient.  The numbers that he brings in reflects when he was taking hydralazine twice a day.   Review of  Systems: .   Review of Systems  Cardiovascular:  Negative for chest pain, claudication, irregular heartbeat, leg swelling, near-syncope, orthopnea, palpitations, paroxysmal nocturnal dyspnea and syncope.  Respiratory:  Negative for shortness of breath.   Hematologic/Lymphatic: Negative for bleeding problem.    Studies Reviewed:   EKG: EKG Interpretation Date/Time:  Wednesday June 11 2023 13:42:17 EST Ventricular Rate:  55 PR Interval:  222 QRS Duration:  84 QT Interval:  420 QTC Calculation: 401 R Axis:   -44  Text Interpretation: Sinus bradycardia with 1st degree A-V block Left axis deviation No previous ECGs available Confirmed by Tessa Lerner [phone]) on 06/11/2023 1:43:46 PM  Echocardiogram: 08/06/2021: Normal LV systolic function with visual EF 60-65%. Left ventricle cavity is normal in size. Normal left ventricular wall thickness. Normal global wall motion. Normal diastolic filling pattern, normal LAP.  Trace tricuspid regurgitation. No evidence of pulmonary hypertension. No prior study for comparison.    Stress Testing: Exercise Myoview stress test 11/14/2021: Exercise nuclear stress test was performed using Bruce protocol.  Low risk study.  See report for additional details  Renal artery duplex  01/01/2023:  No evidence of renal artery occlusive disease in either renal artery.  Normal intrarenal vascular perfusion is noted in both kidneys.  Renal length is within normal limits for both kidneys.  Focal plaque noted in the proximal abdominal aorta.   RADIOLOGY: N/A  Risk Assessment/Calculations:   N/A   Labs:       Latest Ref Rng & Units 12/03/2022    1:24 PM 06/03/2022    1:13 PM 02/01/2022  2:14 PM  CBC  WBC 4.0 - 10.5 K/uL 14.2  14.0  13.3   Hemoglobin 13.0 - 17.0 g/dL 16.1  09.6  04.5   Hematocrit 39.0 - 52.0 % 39.1  37.9  34.6   Platelets 150 - 400 K/uL 238  422  314        Latest Ref Rng & Units 12/03/2022    1:24 PM 06/03/2022    1:13 PM 12/21/2021    12:13 PM  BMP  Glucose 70 - 99 mg/dL 409  811  914   BUN 8 - 23 mg/dL 21  33  29   Creatinine 0.61 - 1.24 mg/dL 7.82  9.56  2.13   Sodium 135 - 145 mmol/L 138  134  138   Potassium 3.5 - 5.1 mmol/L 4.6  4.5  4.5   Chloride 98 - 111 mmol/L 107  102  104   CO2 22 - 32 mmol/L 25  24  25    Calcium 8.9 - 10.3 mg/dL 9.2  9.0  9.6       Latest Ref Rng & Units 12/03/2022    1:24 PM 06/03/2022    1:13 PM 12/21/2021   12:13 PM  CMP  Glucose 70 - 99 mg/dL 086  578  469   BUN 8 - 23 mg/dL 21  33  29   Creatinine 0.61 - 1.24 mg/dL 6.29  5.28  4.13   Sodium 135 - 145 mmol/L 138  134  138   Potassium 3.5 - 5.1 mmol/L 4.6  4.5  4.5   Chloride 98 - 111 mmol/L 107  102  104   CO2 22 - 32 mmol/L 25  24  25    Calcium 8.9 - 10.3 mg/dL 9.2  9.0  9.6   Total Protein 6.5 - 8.1 g/dL 7.4  7.6  7.6   Total Bilirubin 0.3 - 1.2 mg/dL 0.5  0.4  0.4   Alkaline Phos 38 - 126 U/L 51  47  62   AST 15 - 41 U/L 19  21  13    ALT 0 - 44 U/L 17  21  27      No results found for: "CHOL", "HDL", "LDLCALC", "LDLDIRECT", "TRIG", "CHOLHDL" No results for input(s): "LIPOA" in the last 8760 hours. No components found for: "NTPROBNP" No results for input(s): "PROBNP" in the last 8760 hours. No results for input(s): "TSH" in the last 8760 hours.   Physical Exam:    Today's Vitals   06/11/23 1343  BP: (!) 140/72  Pulse: (!) 53  Resp: 16  SpO2: 96%  Weight: 160 lb 6.4 oz (72.8 kg)  Height: 5\' 9"  (1.753 m)   Body mass index is 23.69 kg/m. Wt Readings from Last 3 Encounters:  06/11/23 160 lb 6.4 oz (72.8 kg)  12/10/22 158 lb 8.2 oz (71.9 kg)  11/22/22 163 lb (73.9 kg)    Physical Exam  Constitutional: No distress.  hemodynamically stable  Neck: No JVD present.  Cardiovascular: Normal rate, regular rhythm, S1 normal and S2 normal. Exam reveals no gallop, no S3 and no S4.  No murmur heard. Pulmonary/Chest: Effort normal and breath sounds normal. No stridor. He has no wheezes. He has no rales.  Abdominal:  Soft. Bowel sounds are normal. He exhibits no distension. There is no abdominal tenderness.  Musculoskeletal:        General: No edema.     Cervical back: Neck supple.  Neurological: He is alert and oriented to person, place, and time. He  has intact cranial nerves (2-12).  Skin: Skin is warm.   Impression & Recommendation(s):  Impression:   ICD-10-CM   1. Benign hypertension with CKD (chronic kidney disease) stage III (HCC)  I12.9 EKG 12-Lead   N18.30     2. Shortness of breath  R06.02     3. Type 2 diabetes mellitus with stage 3a chronic kidney disease, without long-term current use of insulin (HCC)  E11.22    N18.31     4. Type 2 diabetes mellitus with hyperlipidemia (HCC)  E11.69    E78.5        Recommendation(s):  Benign hypertension with CKD (chronic kidney disease) stage III (HCC) Office and home blood pressures are acceptable but not at goal.  Recommend a goal SBP of 130 mmHg. Medications reconciled. Ambulatory blood pressure log scanned into the system. Reemphasized importance of low-salt diet. I have asked him to continue to monitor his blood pressures as he is advised to increase hydralazine to 3 times daily dosing to see if his SBP improves.  Instead of having 3 providers manage his blood pressures recommended that he follows with PCP and nephrology for further medication titration with regards to blood pressure management.  Patient is agreeable with the plan of care. Renal duplex notes no evidence for renal artery stenosis as of 12/2022.  Continue amlodipine 10 mg p.o. daily. Continue carvedilol 12.5 mg p.o. twice daily. Continue Farxiga 10 mg p.o. daily. Continue hydralazine 25 mg p.o. 3 times daily  Shortness of breath Resolved  Type 2 diabetes mellitus with stage 3a chronic kidney disease, without long-term current use of insulin (HCC) Reemphasized importance of glycemic control. Consider the addition of ARB for renal protection, he has had hyperkalemia with  telmisartan in the past. Continue statin therapy.  Type 2 diabetes mellitus with hyperlipidemia (HCC) Currently on lovastatin 20 mg p.o. daily.   He denies myalgia or other side effects. Cardiology is following peripherally.  Orders Placed:  Orders Placed This Encounter  Procedures   EKG 12-Lead   Discussed management of at least 2 chronic comorbid conditions, medications reconciled, renal duplex results reviewed with the patient during today's office visit, ambulatory blood pressure log reviewed and scanned into the system, EKG ordered and independently reviewed.  I would like to see him back on as needed basis, sooner if needed.  Final Medication List:   No orders of the defined types were placed in this encounter.   Medications Discontinued During This Encounter  Medication Reason   HUMIRA PEN 40 MG/0.4ML PNKT Patient Preference   fluticasone (FLONASE) 50 MCG/ACT nasal spray Patient Preference   ergocalciferol (VITAMIN D2) 1.25 MG (50000 UT) capsule Patient Preference     Current Outpatient Medications:    acetaminophen (TYLENOL) 500 MG tablet, Take 500 mg by mouth every 6 (six) hours as needed for moderate pain., Disp: , Rfl:    amLODipine (NORVASC) 10 MG tablet, Take 10 mg by mouth daily., Disp: , Rfl:    Ascorbic Acid (VITAMIN C) 1000 MG tablet, Take 1,000 mg by mouth daily., Disp: , Rfl:    b complex vitamins capsule, Take 1 capsule by mouth daily., Disp: , Rfl:    carvedilol (COREG) 12.5 MG tablet, Take 12.5 mg by mouth 2 (two) times daily with a meal., Disp: , Rfl:    cholecalciferol (VITAMIN D3) 25 MCG (1000 UNIT) tablet, Take 1,000 Units by mouth daily., Disp: , Rfl:    clonazePAM (KLONOPIN) 0.5 MG tablet, Take 0.5 mg by mouth at bedtime.,  Disp: , Rfl:    Cyanocobalamin (B-12 PO), Take 1 capsule by mouth daily., Disp: , Rfl:    dapagliflozin propanediol (FARXIGA) 10 MG TABS tablet, Take 10 mg by mouth daily., Disp: , Rfl:    Ferrous Sulfate (IRON PO), Take 1 capsule by  mouth daily., Disp: , Rfl:    Finerenone (KERENDIA) 10 MG TABS, Take 10 mg by mouth daily at 12 noon., Disp: , Rfl:    hydrALAZINE (APRESOLINE) 25 MG tablet, Take 25 mg by mouth 3 (three) times daily., Disp: , Rfl:    hydrOXYzine (ATARAX/VISTARIL) 25 MG tablet, Take 25 mg by mouth daily as needed for anxiety., Disp: , Rfl:    linagliptin (TRADJENTA) 5 MG TABS tablet, Take 5 mg by mouth daily., Disp: , Rfl:    lovastatin (MEVACOR) 20 MG tablet, Take 20 mg by mouth daily at 12 noon., Disp: , Rfl:    Multiple Vitamin (MULTIVITAMIN WITH MINERALS) TABS tablet, Take 1 tablet by mouth daily., Disp: , Rfl:    traZODone (DESYREL) 50 MG tablet, Take 50-100 mg by mouth at bedtime as needed., Disp: , Rfl:   Consent:   NA  Disposition:   As needed Patient may be asked to follow-up sooner based on the results of the above-mentioned testing.  His questions and concerns were addressed to his satisfaction. He voices understanding of the recommendations provided during this encounter.    Signed, Tessa Lerner, DO, [facility]  [facility]  [address] [place], [place] [address] 06/11/2023 3:11 PM

## 2023-06-17 NOTE — Progress Notes (Unsigned)
Kevin Lopez [address], [place] [address]   CLINIC:  Medical Oncology/Hematology  PCP:  Georgianne Fick, MD [address] [place] [address] [phone]   REASON FOR VISIT:  Follow-up for leukocytosis, anemia, unintentional weight loss.     INTERVAL HISTORY:   Kevin Lopez 73 y.o. male returns for routine follow-up of leukocytosis, anemia, and unintentional weight loss.  He was last seen by Rojelio Brenner PA-C on 12/10/2022.   At today's visit, he reports feeling fair.  He denies any interim hospitalizations, surgeries, or changes in baseline health status.   He continues to deny any B symptoms, lymphadenopathy, or masses.   He receives intermittent steroids for his rheumatoid arthritis, most recently received cortisone injection on 12/09/2022, and is on Rinvoq for RA.  No bright red blood per rectum, melena, or epistaxis.   Chronic fatigue is at baseline, and he reports feeling cold all the time.  No pica.  He denies any chest pain or difficulty breathing.  He is taking iron tablets and B12 supplements at home.  He has previously received iron infusions in the outpatient setting via his nephrologist.  Not taking any EPO injections.  His weight is stable since his last visit, but he is frustrated that he cannot gain weight "no matter how much he eats."  No changes in bowel, bladder, or breathing patterns.  He has 65% energy and 60% appetite.     ASSESSMENT & PLAN:  1.  Leukocytosis and thrombocytosis - Flow cytometry on 05/12/2019 did not show any monoclonal B-cell population.  Predominance of lymphocytes and nonspecific changes. - JAK2 V617F and reflex testing was negative.  BCR/ABL by FISH was negative.  He has had elevated ESR, but normal CRP. - Patient has rheumatoid arthritis, follows with rheumatology.   - Most recent steroids with cortisone injection on 12/09/2022 via rheumatology - Most recent CBC (06/11/2023): WBC 15.0,  lymphocytes 6.4, eosinophils 1.6, basophil 0.2.  Normal platelets. - He denies any B symptoms.   - No masses or lymphadenopathy on exam.   - DIFFERENTIAL DIAGNOSIS favors reactive leukocytosis in the setting of rheumatoid arthritis, chronic inflammation, and frequent steroids. - PLAN: Continue active surveillance - Although previous flow cytometry (2020) was unremarkable, we will consider repeat flow cytometry if he has persistent or worsening lymphocytosis   2.  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 - Was stool occult negative when checked in 2021.  Hemoccult negative x3 (August 2023) - Denies any bleeding per rectum or melena - Previously received IV iron in outpatient setting per nephrologist - Hematology panel (12/21/2021): Elevated ferritin 549, normal TIBC and saturation.  Normal B12, MMA, copper, folate.  Normal LDH and reticulocytes.  Negative MGUS/myeloma panel. - CMP consistent with CKD stage IIIa/b - Most recent labs (06/11/2023): Hgb 11.5/MCV 85.1, ferritin 351, iron saturation 30%.  Creatinine 1.58/GFR 46. - DIFFERENTIAL DIAGNOSIS favors anemia secondary to CKD and chronic inflammatory disease - PLAN: No treatment indicated at this time.  We will continue to monitor with repeat CBC, CMP, and iron panel at follow-up visit in 6 months.   3.  Weight loss, RESOLVED - Unexpectedly lost about 10 pounds around 2021-2023 and was concerned by inability to regain the weight he had lost  - He reports colonoscopy last year at outside hospital (records not available in EMR).  He reports more frequent diarrhea lately, but otherwise denies any  changes in bowel and bladder habits.  He does have some difficulty swallowing, reports that he had an esophageal stricture stretched last year, but is having recurrent symptoms again.  Over the past month, he has had intermittent epigastric pain that  is worse in the morning, relieved with eating and burping.  He denies any changes in his breathing patterns. - He has history of prostate cancer s/p partial prostatectomy in early 2000's. - Normal PSA (1.74) on 12/21/2021.  Normal TSH and T4. - CT abdomen/pelvis (02/03/2022): Small umbilical hernia without any associated findings to suggest bowel obstruction, no evidence of malignant process, other incidental findings discussed in radiology report - Weight today is 161 pounds - stable for the past 12 months  4.  Rheumatoid arthritis - He has been on multiple immunosuppressive medications. - Was on Orencia until July 2023.  Previously took Humira. - Currently receiving Rinvoq.   PLAN SUMMARY: >> Labs in 6 months = CBC/D, CMP, LDH, ferritin, iron/TIBC + HOLD sample for POSSIBLE flow cytometry >> OFFICE visit in 6 months, 1 week after labs      REVIEW OF SYSTEMS:   Review of Systems  Constitutional:  Positive for fatigue. Negative for appetite change, chills, diaphoresis, fever and unexpected weight change.  HENT:   Negative for lump/mass and nosebleeds.   Eyes:  Negative for eye problems.  Respiratory:  Negative for cough, hemoptysis and shortness of breath.   Cardiovascular:  Negative for chest pain, leg swelling and palpitations.  Gastrointestinal:  Negative for abdominal pain (chest tightening and indigestion when he eats), blood in stool, constipation, diarrhea, nausea and vomiting.  Genitourinary:  Positive for frequency. Negative for hematuria.   Musculoskeletal:  Positive for arthralgias.  Skin: Negative.   Neurological:  Negative for dizziness, headaches and light-headedness.  Hematological:  Does not bruise/bleed easily.  Psychiatric/Behavioral:  Positive for sleep disturbance. The patient is nervous/anxious.      PHYSICAL EXAM:  ECOG PERFORMANCE STATUS: 1 - Symptomatic but completely ambulatory  Vitals:   06/18/23 1342  BP: 136/77  Pulse: (!) 56  Resp: 16  Temp: 97.7 F  (36.5 C)  SpO2: 99%   Filed Weights   06/18/23 1342  Weight: 161 lb 6 oz (73.2 kg)   Physical Exam Constitutional:      Appearance: Normal appearance. He is normal weight.  Cardiovascular:     Heart sounds: Normal heart sounds.  Pulmonary:     Breath sounds: Normal breath sounds.  Musculoskeletal:     Comments: Nodules and contractures of bilateral hands secondary to rheumatoid arthritis  Neurological:     General: No focal deficit present.     Mental Status: Mental status is at baseline.  Psychiatric:        Behavior: Behavior normal. Behavior is cooperative.     PAST MEDICAL/SURGICAL HISTORY:  Past Medical History:  Diagnosis Date   Chronic kidney disease    Diabetes mellitus without complication (HCC)    Diverticulosis    Hyperlipidemia    Hypertension    RA (rheumatoid arthritis) (HCC)    SBO (small bowel obstruction) s/p ex lap in past 12/15/2015   Past Surgical History:  Procedure Laterality Date   APPENDECTOMY     COLOSTOMY TAKEDOWN     [place]   FLEXIBLE SIGMOIDOSCOPY N/A 12/15/2015   Procedure: FLEXIBLE SIGMOIDOSCOPY;  Surgeon: West Bali, MD;  Location: [facility];  Service: Endoscopy;  Laterality: N/A;   HIP ARTHROPLASTY Left 12/06/2021   Procedure: LEFT HIP ARTHROSCOPY IRRIGATION AND  DEBRIDEMENT.;  Surgeon: Huel Cote, MD;  Location: [facility] OR;  Service: Orthopedics;  Laterality: Left;   KNEE SURGERY Right    LEFT COLECTOMY     [place]   LYSIS OF ADHESION     SBO    SOCIAL HISTORY:  Social History   Socioeconomic History   Marital status: Divorced    Spouse name: Not on file   Number of children: 3   Years of education: Not on file   Highest education level: Not on file  Occupational History   Occupation: retired  Tobacco Use   Smoking status: Never   Smokeless tobacco: Never  Vaping Use   Vaping status: Never Used  Substance and Sexual Activity   Alcohol use: Yes    Comment: occ.    Drug use: No   Sexual activity: Not on file  Other  Topics Concern   Not on file  Social History Narrative   Not on file   Social Drivers of Health   Financial Resource Strain: High Risk (04/18/2020)   Overall Financial Resource Strain (CARDIA)    Difficulty of Paying Living Expenses: Very hard  Food Insecurity: Food Insecurity Present (04/18/2020)   Hunger Vital Sign    Worried About Running Out of Food in the Last Year: Sometimes true    Ran Out of Food in the Last Year: Sometimes true  Transportation Needs: No Transportation Needs (04/18/2020)   PRAPARE - Administrator, Civil Service (Medical): No    Lack of Transportation (Non-Medical): No  Physical Activity: Insufficiently Active (04/18/2020)   Exercise Vital Sign    Days of Exercise per Week: 2 days    Minutes of Exercise per Session: 20 min  Stress: Stress Concern Present (04/18/2020)   [employer] of Occupational Health - Occupational Stress Questionnaire    Feeling of Stress : Rather much  Social Connections: Moderately Isolated (04/18/2020)   Social Connection and Isolation Panel [NHANES]    Frequency of Communication with Friends and Family: More than three times a week    Frequency of Social Gatherings with Friends and Family: Once a week    Attends Religious Services: More than 4 times per year    Active Member of [employer] or Organizations: No    Attends Banker Meetings: Never    Marital Status: Divorced  Catering manager Violence: Not At Risk (09/29/2022)   Received from [employer]   Intimate Partner Violence    Are you denied basic needs such as food, clothing, or medical care?: No    In the past 12 months have you been in a relationship with a person who hurts, threatens, or tries to control you?: No    Are you denied basic needs such as food, clothing, or medical care?: No    In the past 12 months have you been in a relationship with a person who hurts, threatens, or tries to control you?: No    FAMILY HISTORY:  Family  History  Problem Relation Age of Onset   Cancer Mother    Heart attack Father    Hypertension Sister    Hypertension Brother     CURRENT MEDICATIONS:  Outpatient Encounter Medications as of 06/18/2023  Medication Sig   lovastatin (MEVACOR) 40 MG tablet TAKE 1 TABLET BY MOUTH DAILY WITH THE EVENING MEAL   amLODipine (NORVASC) 10 MG tablet Take 10 mg by mouth daily.   ascorbic acid (VITAMIN C) 500 MG tablet Take 500 mg by mouth  daily.   b complex vitamins capsule Take 1 capsule by mouth daily.   carvedilol (COREG) 12.5 MG tablet Take 12.5 mg by mouth 2 (two) times daily with a meal.   cholecalciferol (VITAMIN D3) 25 MCG (1000 UNIT) tablet Take 1,000 Units by mouth daily.   clonazePAM (KLONOPIN) 0.5 MG tablet Take 0.5 mg by mouth at bedtime.   Cyanocobalamin (B-12 PO) Take 1 capsule by mouth daily.   dapagliflozin propanediol (FARXIGA) 10 MG TABS tablet Take 10 mg by mouth daily.   Ferrous Sulfate (IRON PO) Take 1 capsule by mouth daily.   Finerenone (KERENDIA) 10 MG TABS Take 10 mg by mouth daily at 12 noon.   hydrALAZINE (APRESOLINE) 25 MG tablet Take 25 mg by mouth 3 (three) times daily.   hydrOXYzine (ATARAX/VISTARIL) 25 MG tablet Take 25 mg by mouth daily as needed for anxiety.   linagliptin (TRADJENTA) 5 MG TABS tablet Take 5 mg by mouth daily.   Multiple Vitamin (MULTIVITAMIN WITH MINERALS) TABS tablet Take 1 tablet by mouth daily.   traZODone (DESYREL) 50 MG tablet Take 50-100 mg by mouth at bedtime as needed.   [DISCONTINUED] acetaminophen (TYLENOL) 500 MG tablet Take 500 mg by mouth every 6 (six) hours as needed for moderate pain.   [DISCONTINUED] Ascorbic Acid (VITAMIN C) 1000 MG tablet Take 1,000 mg by mouth daily.   [DISCONTINUED] lovastatin (MEVACOR) 20 MG tablet Take 20 mg by mouth daily at 12 noon.   No facility-administered encounter medications on file as of 06/18/2023.    ALLERGIES:  Allergies  Allergen Reactions   Latex Shortness Of Breath   Cephalexin Hives    Irbesartan Hives    swelling    Metformin Diarrhea, Nausea And Vomiting and Swelling    LABORATORY DATA:  I have reviewed the labs as listed.  CBC    Component Value Date/Time   WBC 15.0 (H) 06/11/2023 1521   RBC 4.35 06/11/2023 1521   HGB 11.5 (L) 06/11/2023 1521   HGB 10.2 (L) 07/31/2021 1426   HCT 37.0 (L) 06/11/2023 1521   HCT 31.8 (L) 07/31/2021 1426   PLT 305 06/11/2023 1521   MCV 85.1 06/11/2023 1521   MCH 26.4 06/11/2023 1521   MCHC 31.1 06/11/2023 1521   RDW 14.6 06/11/2023 1521   LYMPHSABS 6.4 (H) 06/11/2023 1521   MONOABS 0.7 06/11/2023 1521   EOSABS 1.6 (H) 06/11/2023 1521   BASOSABS 0.2 (H) 06/11/2023 1521      Latest Ref Rng & Units 06/11/2023    3:21 PM 12/03/2022    1:24 PM 06/03/2022    1:13 PM  CMP  Glucose 70 - 99 mg/dL 161  096  045   BUN 8 - 23 mg/dL 29  21  33   Creatinine 0.61 - 1.24 mg/dL 4.09  8.11  9.14   Sodium 135 - 145 mmol/L 138  138  134   Potassium 3.5 - 5.1 mmol/L 4.2  4.6  4.5   Chloride 98 - 111 mmol/L 105  107  102   CO2 22 - 32 mmol/L 23  25  24    Calcium 8.9 - 10.3 mg/dL 9.3  9.2  9.0   Total Protein 6.5 - 8.1 g/dL 7.3  7.4  7.6   Total Bilirubin 0.0 - 1.2 mg/dL 0.5  0.5  0.4   Alkaline Phos 38 - 126 U/L 54  51  47   AST 15 - 41 U/L 30  19  21    ALT 0 - 44 U/L  29  17  21      DIAGNOSTIC IMAGING:  I have independently reviewed the relevant imaging and discussed with the patient.   WRAP UP:  All questions were answered. The patient knows to call the clinic with any problems, questions or concerns.  Medical decision making: Moderate  Time spent on visit: I spent 25 minutes counseling the patient face to face. The total time spent in the appointment was 40 minutes and more than 50% was on counseling.  Carnella Guadalajara, PA-C  06/18/23 2:25 PM

## 2023-06-18 ENCOUNTER — Inpatient Hospital Stay (HOSPITAL_BASED_OUTPATIENT_CLINIC_OR_DEPARTMENT_OTHER): Payer: Medicare Other | Admitting: Physician Assistant

## 2023-06-18 VITALS — BP 136/77 | HR 56 | Temp 97.7°F | Resp 16 | Wt 161.4 lb

## 2023-06-18 DIAGNOSIS — R197 Diarrhea, unspecified: Secondary | ICD-10-CM | POA: Diagnosis not present

## 2023-06-18 DIAGNOSIS — N183 Chronic kidney disease, stage 3 unspecified: Secondary | ICD-10-CM | POA: Diagnosis not present

## 2023-06-18 DIAGNOSIS — D631 Anemia in chronic kidney disease: Secondary | ICD-10-CM

## 2023-06-18 DIAGNOSIS — D72829 Elevated white blood cell count, unspecified: Secondary | ICD-10-CM

## 2023-06-18 DIAGNOSIS — D75839 Thrombocytosis, unspecified: Secondary | ICD-10-CM | POA: Diagnosis not present

## 2023-06-18 DIAGNOSIS — R634 Abnormal weight loss: Secondary | ICD-10-CM | POA: Diagnosis not present

## 2023-06-18 DIAGNOSIS — N2581 Secondary hyperparathyroidism of renal origin: Secondary | ICD-10-CM | POA: Diagnosis not present

## 2023-06-18 DIAGNOSIS — M069 Rheumatoid arthritis, unspecified: Secondary | ICD-10-CM | POA: Diagnosis not present

## 2023-06-18 DIAGNOSIS — D649 Anemia, unspecified: Secondary | ICD-10-CM | POA: Diagnosis not present

## 2023-06-18 NOTE — Patient Instructions (Signed)
[facility] **VISIT SUMMARY & IMPORTANT INSTRUCTIONS **    You were seen today by Rojelio Brenner PA-C for your anemia and elevated white blood cells.     ANEMIA: This is related to your chronic kidney disease and chronic inflammation from rheumatoid arthritis.  Your blood levels are currently within satisfactory range.  You do not need any additional treatment for anemia at this time.   LEUKOCYTOSIS: Your white blood cells are elevated due to chronic inflammation and intermittent steroid medications.  You do not need any additional testing or treatment for elevated white blood cells at this time.   LABS: Return in 6 months for repeat labs   FOLLOW-UP APPOINTMENT: Office visit in 6 months   ** Thank you for trusting me with your healthcare!  I strive to provide all of my patients with quality care at each visit.  If you receive a survey for this visit, I would be so grateful to you for taking the time to provide feedback.  Thank you in advance!  ~ Pascha Fogal                   Dr. Doreatha Massed   &   Rojelio Brenner, PA-C   - - - - - - - - - - - - - - - - - -    Thank you for choosing [facility] at [facility] to provide your oncology and hematology care.  To afford each patient quality time with our provider, please arrive at least 15 minutes before your scheduled appointment time.   If you have a lab appointment with the Cancer Center please come in thru the Main Entrance and check in at the main information desk.  You need to re-schedule your appointment should you arrive 10 or more minutes late.  We strive to give you quality time with our providers, and arriving late affects you and other patients whose appointments are after yours.  Also, if you no show three or more times for appointments you may be dismissed from the clinic at the providers discretion.     Again, thank you for choosing [facility].  Our  hope is that these requests will decrease the amount of time that you wait before being seen by our physicians.       _____________________________________________________________  Should you have questions after your visit to [facility], please contact our office at [phone] and follow the prompts.  Our office hours are 8:00 a.m. and 4:30 p.m. Monday - Friday.  Please note that voicemails left after 4:00 p.m. may not be returned until the following business day.  We are closed weekends and major holidays.  You do have access to a nurse 24-7, just call the main number to the clinic [phone] and do not press any options, hold on the line and a nurse will answer the phone.    For prescription refill requests, have your pharmacy contact our office and allow 72 hours.

## 2023-06-19 DIAGNOSIS — N2581 Secondary hyperparathyroidism of renal origin: Secondary | ICD-10-CM | POA: Diagnosis not present

## 2023-06-19 DIAGNOSIS — G44209 Tension-type headache, unspecified, not intractable: Secondary | ICD-10-CM | POA: Diagnosis not present

## 2023-06-19 DIAGNOSIS — I129 Hypertensive chronic kidney disease with stage 1 through stage 4 chronic kidney disease, or unspecified chronic kidney disease: Secondary | ICD-10-CM | POA: Diagnosis not present

## 2023-06-19 DIAGNOSIS — D631 Anemia in chronic kidney disease: Secondary | ICD-10-CM | POA: Diagnosis not present

## 2023-06-19 DIAGNOSIS — F411 Generalized anxiety disorder: Secondary | ICD-10-CM | POA: Diagnosis not present

## 2023-06-19 DIAGNOSIS — G4700 Insomnia, unspecified: Secondary | ICD-10-CM | POA: Diagnosis not present

## 2023-06-19 DIAGNOSIS — N189 Chronic kidney disease, unspecified: Secondary | ICD-10-CM | POA: Diagnosis not present

## 2023-06-19 DIAGNOSIS — N183 Chronic kidney disease, stage 3 unspecified: Secondary | ICD-10-CM | POA: Diagnosis not present

## 2023-06-19 DIAGNOSIS — F410 Panic disorder [episodic paroxysmal anxiety] without agoraphobia: Secondary | ICD-10-CM | POA: Diagnosis not present

## 2023-06-25 ENCOUNTER — Ambulatory Visit (HOSPITAL_BASED_OUTPATIENT_CLINIC_OR_DEPARTMENT_OTHER): Payer: Medicare Other | Admitting: Orthopaedic Surgery

## 2023-06-25 ENCOUNTER — Other Ambulatory Visit (HOSPITAL_BASED_OUTPATIENT_CLINIC_OR_DEPARTMENT_OTHER): Payer: Self-pay

## 2023-06-25 ENCOUNTER — Encounter (HOSPITAL_COMMUNITY): Payer: Self-pay | Admitting: Hematology

## 2023-06-25 DIAGNOSIS — M5416 Radiculopathy, lumbar region: Secondary | ICD-10-CM | POA: Diagnosis not present

## 2023-06-25 MED ORDER — METHYLPREDNISOLONE 4 MG PO TBPK
ORAL_TABLET | ORAL | 0 refills | Status: DC
  Filled 2023-06-25: qty 21, 6d supply, fill #0

## 2023-06-25 NOTE — Progress Notes (Signed)
 Follow-up evaluation      Interval History:   Presents today for follow-up predominantly of his lower back.  He is experiencing pain that radiates down the right posterior aspect of the back and thigh.  This has been occurring for the last month and progressively worsening.  He has not had any treatments for this.  He is continuing to stay active and do stretches.   PMH/PSH/Family History/Social History/Meds/Allergies:    Past Medical History:  Diagnosis Date   Chronic kidney disease    Diabetes mellitus without complication (HCC)    Diverticulosis    Hyperlipidemia    Hypertension    RA (rheumatoid arthritis) (HCC)    SBO (small bowel obstruction) s/p ex lap in past 12/15/2015   Past Surgical History:  Procedure Laterality Date   APPENDECTOMY     [place]OMY TAKEDOWN     [place]   FLEXIBLE SIGMOIDOSCOPY N/A 12/15/2015   Procedure: FLEXIBLE SIGMOIDOSCOPY;  Surgeon: Margo LITTIE Haddock, MD;  Location: [facility]ITE;  Service: Endoscopy;  Laterality: N/A;   HIP ARTHROPLASTY Left 12/06/2021   Procedure: LEFT HIP ARTHROSCOPY IRRIGATION AND DEBRIDEMENT.;  Surgeon: Genelle Standing, MD;  Location: [facility] OR;  Service: Orthopedics;  Laterality: Left;   KNEE SURGERY Right    LEFT COLECTOMY     [place]   LYSIS OF ADHESION     SBO   Social History   Socioeconomic History   Marital status: Divorced    Spouse name: Not on file   Number of children: 3   Years of education: Not on file   Highest education level: Not on file  Occupational History   Occupation: retired  Tobacco Use   Smoking status: Never   Smokeless tobacco: Never  Vaping Use   Vaping status: Never Used  Substance and Sexual Activity   Alcohol  use: Yes    Comment: occ.    Drug use: No   Sexual activity: Not on file  Other Topics Concern   Not on file  Social History Narrative   Not on file   Social Drivers of Health   Financial Resource Strain: High Risk (04/18/2020)   Overall Financial  Resource Strain (CARDIA)    Difficulty of Paying Living Expenses: Very hard  Food Insecurity: Food Insecurity Present (04/18/2020)   Hunger Vital Sign    Worried About Running Out of Food in the Last Year: Sometimes true    Ran Out of Food in the Last Year: Sometimes true  Transportation Needs: No Transportation Needs (04/18/2020)   PRAPARE - Administrator, Civil Service (Medical): No    Lack of Transportation (Non-Medical): No  Physical Activity: Insufficiently Active (04/18/2020)   Exercise Vital Sign    Days of Exercise per Week: 2 days    Minutes of Exercise per Session: 20 min  Stress: Stress Concern Present (04/18/2020)   [employer] of Occupational Health - Occupational Stress Questionnaire    Feeling of Stress : Rather much  Social Connections: Moderately Isolated (04/18/2020)   Social Connection and Isolation Panel [NHANES]    Frequency of Communication with Friends and Family: More than three times a week    Frequency of Social Gatherings with Friends and Family: Once a week    Attends Religious Services: More than 4 times per year    Active Member  of Clubs or Organizations: No    Attends Banker Meetings: Never    Marital Status: Divorced   Family History  Problem Relation Age of Onset   Cancer Mother    Heart attack Father    Hypertension Sister    Hypertension Brother    Allergies  Allergen Reactions   Latex Shortness Of Breath   Cephalexin Hives   Irbesartan Hives    swelling    Metformin Diarrhea, Nausea And Vomiting and Swelling   Current Outpatient Medications  Medication Sig Dispense Refill   methylPREDNISolone  (MEDROL  DOSEPAK) 4 MG TBPK tablet Take per packet instructions 21 each 0   amLODipine  (NORVASC ) 10 MG tablet Take 10 mg by mouth daily.     ascorbic acid  (VITAMIN C) 500 MG tablet Take 500 mg by mouth daily.     b complex vitamins capsule Take 1 capsule by mouth daily.     carvedilol  (COREG ) 12.5 MG tablet Take  12.5 mg by mouth 2 (two) times daily with a meal.     cholecalciferol (VITAMIN D3) 25 MCG (1000 UNIT) tablet Take 1,000 Units by mouth daily.     clonazePAM  (KLONOPIN ) 0.5 MG tablet Take 0.5 mg by mouth at bedtime.     Cyanocobalamin  (B-12 PO) Take 1 capsule by mouth daily.     dapagliflozin propanediol (FARXIGA) 10 MG TABS tablet Take 10 mg by mouth daily.     Ferrous Sulfate (IRON PO) Take 1 capsule by mouth daily.     Finerenone (KERENDIA) 10 MG TABS Take 10 mg by mouth daily at 12 noon.     hydrALAZINE  (APRESOLINE ) 25 MG tablet Take 25 mg by mouth 3 (three) times daily.     hydrOXYzine  (ATARAX /VISTARIL ) 25 MG tablet Take 25 mg by mouth daily as needed for anxiety.     linagliptin (TRADJENTA) 5 MG TABS tablet Take 5 mg by mouth daily.     lovastatin (MEVACOR) 40 MG tablet TAKE 1 TABLET BY MOUTH DAILY WITH THE EVENING MEAL     Multiple Vitamin (MULTIVITAMIN WITH MINERALS) TABS tablet Take 1 tablet by mouth daily.     traZODone (DESYREL) 50 MG tablet Take 50-100 mg by mouth at bedtime as needed.     No current facility-administered medications for this visit.   No results found.  Review of Systems:   A ROS was performed including pertinent positives and negatives as documented in the HPI.   Musculoskeletal Exam:    There were no vitals taken for this visit.  Positive straight leg raise on the right.  There is pain rating down the right lateral aspect of the thigh.  No obvious weakness on any muscle testing.  Sensation is intact throughout  Imaging:    None  I personally reviewed and interpreted the radiographs.   Assessment:   With evidence of right lumbar disc herniation.  At today's visit I did recommend an initial course of steroid with an oral taper and I did discuss the inversion table which she would like to pursue.  I will plan to see him back as needed  Plan :    -Return clinic as needed        I personally saw and evaluated the patient, and participated in  the management and treatment plan.  Elspeth Parker, MD Attending Physician, Orthopedic Surgery  This document was dictated using Dragon voice recognition software. A reasonable attempt at proof reading has been made to minimize errors.

## 2023-07-25 ENCOUNTER — Telehealth (HOSPITAL_BASED_OUTPATIENT_CLINIC_OR_DEPARTMENT_OTHER): Payer: Self-pay | Admitting: Orthopaedic Surgery

## 2023-07-25 NOTE — Telephone Encounter (Signed)
 Patient states that Dr B suggest a conversion table and he states that is not helping him. He said Dr. B mentioned an injection and he was intrested in that now

## 2023-07-28 ENCOUNTER — Other Ambulatory Visit (HOSPITAL_BASED_OUTPATIENT_CLINIC_OR_DEPARTMENT_OTHER): Payer: Self-pay | Admitting: Orthopaedic Surgery

## 2023-07-28 DIAGNOSIS — M5416 Radiculopathy, lumbar region: Secondary | ICD-10-CM

## 2023-07-28 NOTE — Telephone Encounter (Signed)
 Referral to Dr. Alvester Morin placed

## 2023-07-30 DIAGNOSIS — L810 Postinflammatory hyperpigmentation: Secondary | ICD-10-CM | POA: Diagnosis not present

## 2023-07-30 DIAGNOSIS — L738 Other specified follicular disorders: Secondary | ICD-10-CM | POA: Diagnosis not present

## 2023-08-11 ENCOUNTER — Other Ambulatory Visit: Payer: Self-pay

## 2023-08-11 ENCOUNTER — Ambulatory Visit (INDEPENDENT_AMBULATORY_CARE_PROVIDER_SITE_OTHER): Admitting: Physical Medicine and Rehabilitation

## 2023-08-11 VITALS — BP 150/65 | HR 58

## 2023-08-11 DIAGNOSIS — M5416 Radiculopathy, lumbar region: Secondary | ICD-10-CM

## 2023-08-11 MED ORDER — METHYLPREDNISOLONE ACETATE 40 MG/ML IJ SUSP
40.0000 mg | Freq: Once | INTRAMUSCULAR | Status: AC
  Administered 2023-08-11: 40 mg

## 2023-08-11 NOTE — Progress Notes (Unsigned)
 Pain Scale   Average Pain 7        +Driver, -BT, -Dye Allergies.

## 2023-08-11 NOTE — Patient Instructions (Signed)

## 2023-08-12 DIAGNOSIS — F410 Panic disorder [episodic paroxysmal anxiety] without agoraphobia: Secondary | ICD-10-CM | POA: Diagnosis not present

## 2023-08-12 DIAGNOSIS — E782 Mixed hyperlipidemia: Secondary | ICD-10-CM | POA: Diagnosis not present

## 2023-08-12 DIAGNOSIS — G44209 Tension-type headache, unspecified, not intractable: Secondary | ICD-10-CM | POA: Diagnosis not present

## 2023-08-12 DIAGNOSIS — R809 Proteinuria, unspecified: Secondary | ICD-10-CM | POA: Diagnosis not present

## 2023-08-12 DIAGNOSIS — G4700 Insomnia, unspecified: Secondary | ICD-10-CM | POA: Diagnosis not present

## 2023-08-12 DIAGNOSIS — E1129 Type 2 diabetes mellitus with other diabetic kidney complication: Secondary | ICD-10-CM | POA: Diagnosis not present

## 2023-08-12 DIAGNOSIS — N1832 Chronic kidney disease, stage 3b: Secondary | ICD-10-CM | POA: Diagnosis not present

## 2023-08-12 DIAGNOSIS — F411 Generalized anxiety disorder: Secondary | ICD-10-CM | POA: Diagnosis not present

## 2023-08-14 NOTE — Progress Notes (Signed)
 Kevin Lopez - 73 y.o. male MRN [id number]  Date of birth: July 23, 1950  Office Visit Note: Visit Date: 08/11/2023 PCP: Georgianne Fick, MD Referred by: Georgianne Fick, MD  Subjective: Chief Complaint  Patient presents with   Lower Back - Pain   HPI:  Kevin Lopez is a 73 y.o. male who comes in today at the request of Dr. Huel Cote for planned Right L3-4 Lumbar Interlaminar epidural steroid injection with fluoroscopic guidance.  The patient has failed conservative care including home exercise, medications, time and activity modification.  This injection will be diagnostic and hopefully therapeutic.  Please see requesting physician notes for further details and justification.   ROS Otherwise per HPI.  Assessment & Plan: Visit Diagnoses:    ICD-10-CM   1. Radiculopathy, lumbar region  M54.16 XR C-ARM NO REPORT    Epidural Steroid injection    methylPREDNISolone acetate (DEPO-MEDROL) injection 40 mg      Plan: No additional findings.   Meds & Orders:  Meds ordered this encounter  Medications   methylPREDNISolone acetate (DEPO-MEDROL) injection 40 mg    Orders Placed This Encounter  Procedures   XR C-ARM NO REPORT   Epidural Steroid injection    Follow-up: Return for visit to requesting provider as needed.   Procedures: No procedures performed  Lumbar Epidural Steroid Injection - Interlaminar Approach with Fluoroscopic Guidance  Patient: Kevin Lopez      Date of Birth: 04/19/1951 MRN: [id number] PCP: Georgianne Fick, MD      Visit Date: 08/11/2023   Universal Protocol:     Consent Given By: the patient  Position: PRONE  Additional Comments: Vital signs were monitored before and after the procedure. Patient was prepped and draped in the usual sterile fashion. The correct patient, procedure, and site was verified.   Injection Procedure Details:   Procedure diagnoses: Radiculopathy, lumbar region [M54.16]   Meds  Administered:  Meds ordered this encounter  Medications   methylPREDNISolone acetate (DEPO-MEDROL) injection 40 mg     Laterality: Right  Location/Site:  L3-4  Needle: 3.5 in., 20 ga. Tuohy  Needle Placement: Paramedian epidural  Findings:   -Comments: Excellent flow of contrast into the epidural space.  Procedure Details: Using a paramedian approach from the side mentioned above, the region overlying the inferior lamina was localized under fluoroscopic visualization and the soft tissues overlying this structure were infiltrated with 4 ml. of 1% Lidocaine without Epinephrine. The Tuohy needle was inserted into the epidural space using a paramedian approach.   The epidural space was localized using loss of resistance along with counter oblique bi-planar fluoroscopic views.  After negative aspirate for air, blood, and CSF, a 2 ml. volume of Isovue-250 was injected into the epidural space and the flow of contrast was observed. Radiographs were obtained for documentation purposes.    The injectate was administered into the level noted above.   Additional Comments:  The patient tolerated the procedure well Dressing: 2 x 2 sterile gauze and Band-Aid    Post-procedure details: Patient was observed during the procedure. Post-procedure instructions were reviewed.  Patient left the clinic in stable condition.   Clinical History: MRI LUMBAR SPINE WITHOUT AND WITH CONTRAST   TECHNIQUE: Multiplanar and multiecho pulse sequences of the lumbar spine were obtained without and with intravenous contrast.   CONTRAST:  7.41mL GADAVIST GADOBUTROL 1 MMOL/ML IV SOLN   COMPARISON:  06/13/2017 report, images currently not available   FINDINGS: Segmentation:  5 lumbar type vertebrae   Alignment:  Mild dextroscoliosis  Vertebrae: Benign heterogeneity of marrow. No fracture, discitis, or aggressive bone lesion   Conus medullaris and cauda equina: Conus extends to the L1 level. Conus and  cauda equina appear normal.   Paraspinal and other soft tissues: Negative for perispinal mass or inflammation   Disc levels:   T12- L1: Unremarkable.   L1-L2: Disc narrowing and bulging.  Posterior annular fissure.   L2-L3: Right foraminal and extraforaminal herniation without nerve root compression.   L3-L4: Disc collapse with endplate ridging and endplate degeneration eccentric to the left where there is also greater facet spurring. Left foraminal impingement with spur and disc compressing the L3 nerve root on sagittal images. Moderate spinal stenosis   L4-L5: Disc narrowing and bulging with right inferior foraminal protrusion. Accentuated right facet spurring. Right foraminal impingement.   L5-S1:Disc narrowing and mild bulging.   IMPRESSION: 1. No acute finding. No significant change when compared to MRI report from 2019. 2. Degenerative foraminal impingement on the left at L3-4 and right at L4-5. 3. L3-4 moderate spinal stenosis.     Electronically Signed   By: Tiburcio Pea M.D.   On: 12/06/2021 05:57     Objective:  VS:  HT:    WT:   BMI:     BP:(!) 150/65  HR:(!) 58bpm  TEMP: ( )  RESP:  Physical Exam Vitals and nursing note reviewed.  Constitutional:      General: He is not in acute distress.    Appearance: Normal appearance. He is not ill-appearing.  HENT:     Head: Normocephalic and atraumatic.     Right Ear: External ear normal.     Left Ear: External ear normal.     Nose: No congestion.  Eyes:     Extraocular Movements: Extraocular movements intact.  Cardiovascular:     Rate and Rhythm: Normal rate.     Pulses: Normal pulses.  Pulmonary:     Effort: Pulmonary effort is normal. No respiratory distress.  Abdominal:     General: There is no distension.     Palpations: Abdomen is soft.  Musculoskeletal:        General: No tenderness or signs of injury.     Cervical back: Neck supple.     Right lower leg: No edema.     Left lower leg: No  edema.     Comments: Patient has good distal strength without clonus.  Skin:    Findings: No erythema or rash.  Neurological:     General: No focal deficit present.     Mental Status: He is alert and oriented to person, place, and time.     Sensory: No sensory deficit.     Motor: No weakness or abnormal muscle tone.     Coordination: Coordination normal.  Psychiatric:        Mood and Affect: Mood normal.        Behavior: Behavior normal.      Imaging: No results found.

## 2023-08-14 NOTE — Procedures (Signed)
 Lumbar Epidural Steroid Injection - Interlaminar Approach with Fluoroscopic Guidance  Patient: Kevin Lopez      Date of Birth: 1950/09/03 MRN: [id number] PCP: Georgianne Fick, MD      Visit Date: 08/11/2023   Universal Protocol:     Consent Given By: the patient  Position: PRONE  Additional Comments: Vital signs were monitored before and after the procedure. Patient was prepped and draped in the usual sterile fashion. The correct patient, procedure, and site was verified.   Injection Procedure Details:   Procedure diagnoses: Radiculopathy, lumbar region [M54.16]   Meds Administered:  Meds ordered this encounter  Medications   methylPREDNISolone acetate (DEPO-MEDROL) injection 40 mg     Laterality: Right  Location/Site:  L3-4  Needle: 3.5 in., 20 ga. Tuohy  Needle Placement: Paramedian epidural  Findings:   -Comments: Excellent flow of contrast into the epidural space.  Procedure Details: Using a paramedian approach from the side mentioned above, the region overlying the inferior lamina was localized under fluoroscopic visualization and the soft tissues overlying this structure were infiltrated with 4 ml. of 1% Lidocaine without Epinephrine. The Tuohy needle was inserted into the epidural space using a paramedian approach.   The epidural space was localized using loss of resistance along with counter oblique bi-planar fluoroscopic views.  After negative aspirate for air, blood, and CSF, a 2 ml. volume of Isovue-250 was injected into the epidural space and the flow of contrast was observed. Radiographs were obtained for documentation purposes.    The injectate was administered into the level noted above.   Additional Comments:  The patient tolerated the procedure well Dressing: 2 x 2 sterile gauze and Band-Aid    Post-procedure details: Patient was observed during the procedure. Post-procedure instructions were reviewed.  Patient left the clinic in  stable condition.

## 2023-10-28 DIAGNOSIS — N401 Enlarged prostate with lower urinary tract symptoms: Secondary | ICD-10-CM | POA: Diagnosis not present

## 2023-10-30 DIAGNOSIS — N401 Enlarged prostate with lower urinary tract symptoms: Secondary | ICD-10-CM | POA: Diagnosis not present

## 2023-10-30 DIAGNOSIS — R351 Nocturia: Secondary | ICD-10-CM | POA: Diagnosis not present

## 2023-10-30 DIAGNOSIS — N35919 Unspecified urethral stricture, male, unspecified site: Secondary | ICD-10-CM | POA: Diagnosis not present

## 2023-10-30 DIAGNOSIS — E291 Testicular hypofunction: Secondary | ICD-10-CM | POA: Diagnosis not present

## 2023-10-30 DIAGNOSIS — R7309 Other abnormal glucose: Secondary | ICD-10-CM | POA: Diagnosis not present

## 2023-10-30 DIAGNOSIS — R81 Glycosuria: Secondary | ICD-10-CM | POA: Diagnosis not present

## 2023-10-30 DIAGNOSIS — L732 Hidradenitis suppurativa: Secondary | ICD-10-CM | POA: Diagnosis not present

## 2023-10-30 DIAGNOSIS — Z125 Encounter for screening for malignant neoplasm of prostate: Secondary | ICD-10-CM | POA: Diagnosis not present

## 2023-10-30 DIAGNOSIS — N529 Male erectile dysfunction, unspecified: Secondary | ICD-10-CM | POA: Diagnosis not present

## 2023-10-30 DIAGNOSIS — R3915 Urgency of urination: Secondary | ICD-10-CM | POA: Diagnosis not present

## 2023-11-13 DIAGNOSIS — N183 Chronic kidney disease, stage 3 unspecified: Secondary | ICD-10-CM | POA: Diagnosis not present

## 2023-11-13 DIAGNOSIS — N2581 Secondary hyperparathyroidism of renal origin: Secondary | ICD-10-CM | POA: Diagnosis not present

## 2023-11-19 DIAGNOSIS — N183 Chronic kidney disease, stage 3 unspecified: Secondary | ICD-10-CM | POA: Diagnosis not present

## 2023-11-19 DIAGNOSIS — D631 Anemia in chronic kidney disease: Secondary | ICD-10-CM | POA: Diagnosis not present

## 2023-11-19 DIAGNOSIS — N2581 Secondary hyperparathyroidism of renal origin: Secondary | ICD-10-CM | POA: Diagnosis not present

## 2023-11-19 DIAGNOSIS — I129 Hypertensive chronic kidney disease with stage 1 through stage 4 chronic kidney disease, or unspecified chronic kidney disease: Secondary | ICD-10-CM | POA: Diagnosis not present

## 2023-12-01 DIAGNOSIS — Z125 Encounter for screening for malignant neoplasm of prostate: Secondary | ICD-10-CM | POA: Diagnosis not present

## 2023-12-01 DIAGNOSIS — E782 Mixed hyperlipidemia: Secondary | ICD-10-CM | POA: Diagnosis not present

## 2023-12-01 DIAGNOSIS — N1832 Chronic kidney disease, stage 3b: Secondary | ICD-10-CM | POA: Diagnosis not present

## 2023-12-01 DIAGNOSIS — R809 Proteinuria, unspecified: Secondary | ICD-10-CM | POA: Diagnosis not present

## 2023-12-01 DIAGNOSIS — E1129 Type 2 diabetes mellitus with other diabetic kidney complication: Secondary | ICD-10-CM | POA: Diagnosis not present

## 2023-12-01 DIAGNOSIS — R5383 Other fatigue: Secondary | ICD-10-CM | POA: Diagnosis not present

## 2023-12-05 DIAGNOSIS — L4050 Arthropathic psoriasis, unspecified: Secondary | ICD-10-CM | POA: Diagnosis not present

## 2023-12-05 DIAGNOSIS — M109 Gout, unspecified: Secondary | ICD-10-CM | POA: Diagnosis not present

## 2023-12-05 DIAGNOSIS — M1120 Other chondrocalcinosis, unspecified site: Secondary | ICD-10-CM | POA: Diagnosis not present

## 2023-12-05 DIAGNOSIS — M0579 Rheumatoid arthritis with rheumatoid factor of multiple sites without organ or systems involvement: Secondary | ICD-10-CM | POA: Diagnosis not present

## 2023-12-05 DIAGNOSIS — M5386 Other specified dorsopathies, lumbar region: Secondary | ICD-10-CM | POA: Diagnosis not present

## 2023-12-05 DIAGNOSIS — M810 Age-related osteoporosis without current pathological fracture: Secondary | ICD-10-CM | POA: Diagnosis not present

## 2023-12-05 DIAGNOSIS — Z79899 Other long term (current) drug therapy: Secondary | ICD-10-CM | POA: Diagnosis not present

## 2023-12-08 DIAGNOSIS — N182 Chronic kidney disease, stage 2 (mild): Secondary | ICD-10-CM | POA: Diagnosis not present

## 2023-12-08 DIAGNOSIS — M150 Primary generalized (osteo)arthritis: Secondary | ICD-10-CM | POA: Diagnosis not present

## 2023-12-08 DIAGNOSIS — E1122 Type 2 diabetes mellitus with diabetic chronic kidney disease: Secondary | ICD-10-CM | POA: Diagnosis not present

## 2023-12-08 DIAGNOSIS — M0589 Other rheumatoid arthritis with rheumatoid factor of multiple sites: Secondary | ICD-10-CM | POA: Diagnosis not present

## 2023-12-08 DIAGNOSIS — N401 Enlarged prostate with lower urinary tract symptoms: Secondary | ICD-10-CM | POA: Diagnosis not present

## 2023-12-08 DIAGNOSIS — F419 Anxiety disorder, unspecified: Secondary | ICD-10-CM | POA: Diagnosis not present

## 2023-12-08 DIAGNOSIS — L4050 Arthropathic psoriasis, unspecified: Secondary | ICD-10-CM | POA: Diagnosis not present

## 2023-12-08 DIAGNOSIS — I129 Hypertensive chronic kidney disease with stage 1 through stage 4 chronic kidney disease, or unspecified chronic kidney disease: Secondary | ICD-10-CM | POA: Diagnosis not present

## 2023-12-08 DIAGNOSIS — E782 Mixed hyperlipidemia: Secondary | ICD-10-CM | POA: Diagnosis not present

## 2023-12-08 DIAGNOSIS — Z0000 Encounter for general adult medical examination without abnormal findings: Secondary | ICD-10-CM | POA: Diagnosis not present

## 2023-12-10 ENCOUNTER — Inpatient Hospital Stay: Payer: Medicare Other

## 2023-12-17 ENCOUNTER — Inpatient Hospital Stay: Payer: Medicare Other | Admitting: Physician Assistant

## 2023-12-26 DIAGNOSIS — M810 Age-related osteoporosis without current pathological fracture: Secondary | ICD-10-CM | POA: Diagnosis not present

## 2023-12-31 ENCOUNTER — Telehealth: Payer: Self-pay | Admitting: Physical Medicine and Rehabilitation

## 2023-12-31 NOTE — Telephone Encounter (Signed)
 Pt called requesting an appt for injection wirth Newton. Last injection 3/ 24/ 25. Please call pt at [phone].

## 2024-01-02 ENCOUNTER — Telehealth: Payer: Self-pay

## 2024-01-02 ENCOUNTER — Other Ambulatory Visit: Payer: Self-pay | Admitting: Physical Medicine and Rehabilitation

## 2024-01-02 DIAGNOSIS — M5416 Radiculopathy, lumbar region: Secondary | ICD-10-CM

## 2024-01-02 NOTE — Telephone Encounter (Signed)
 Last injection 07/2023 % 80 of relief and function ability Duration of relief/improvement--5 months Current pain score--7 Recent falls or injuries--None Same location and same pain as last time

## 2024-01-05 ENCOUNTER — Other Ambulatory Visit: Payer: Self-pay | Admitting: Physical Medicine and Rehabilitation

## 2024-01-05 ENCOUNTER — Telehealth: Payer: Self-pay

## 2024-01-05 DIAGNOSIS — M5416 Radiculopathy, lumbar region: Secondary | ICD-10-CM

## 2024-01-05 NOTE — Telephone Encounter (Signed)
 Patient want to go to [facility] for Injection due to going out of town August 28th--right L3-L4 [place]

## 2024-01-08 ENCOUNTER — Inpatient Hospital Stay
Admission: RE | Admit: 2024-01-08 | Discharge: 2024-01-08 | Disposition: A | Discharge status: Home / Self Care | Source: Ambulatory Visit | Attending: Physical Medicine and Rehabilitation | Admitting: Physical Medicine and Rehabilitation

## 2024-01-08 DIAGNOSIS — M4726 Other spondylosis with radiculopathy, lumbar region: Secondary | ICD-10-CM | POA: Diagnosis not present

## 2024-01-08 DIAGNOSIS — M5416 Radiculopathy, lumbar region: Secondary | ICD-10-CM

## 2024-01-08 MED ORDER — IOPAMIDOL (ISOVUE-M 200) INJECTION 41%
1.0000 mL | Freq: Once | INTRAMUSCULAR | Status: AC
  Administered 2024-01-08: 1 mL via EPIDURAL

## 2024-01-08 MED ORDER — METHYLPREDNISOLONE ACETATE 40 MG/ML INJ SUSP (RADIOLOG
80.0000 mg | Freq: Once | INTRAMUSCULAR | Status: AC
  Administered 2024-01-08: 80 mg via EPIDURAL

## 2024-01-08 NOTE — Discharge Instructions (Signed)

## 2024-01-15 DIAGNOSIS — N183 Chronic kidney disease, stage 3 unspecified: Secondary | ICD-10-CM | POA: Diagnosis not present

## 2024-03-04 ENCOUNTER — Encounter: Admitting: Physical Medicine and Rehabilitation

## 2024-03-22 ENCOUNTER — Encounter: Payer: Self-pay | Admitting: Radiology

## 2024-03-29 ENCOUNTER — Inpatient Hospital Stay: Attending: Internal Medicine

## 2024-04-05 ENCOUNTER — Inpatient Hospital Stay: Admitting: Physician Assistant

## 2024-04-05 DIAGNOSIS — L732 Hidradenitis suppurativa: Secondary | ICD-10-CM | POA: Diagnosis not present

## 2024-04-07 DIAGNOSIS — R140 Abdominal distension (gaseous): Secondary | ICD-10-CM | POA: Diagnosis not present

## 2024-04-07 DIAGNOSIS — K219 Gastro-esophageal reflux disease without esophagitis: Secondary | ICD-10-CM | POA: Diagnosis not present

## 2024-04-07 DIAGNOSIS — K5904 Chronic idiopathic constipation: Secondary | ICD-10-CM | POA: Diagnosis not present

## 2024-04-21 DIAGNOSIS — Z23 Encounter for immunization: Secondary | ICD-10-CM | POA: Diagnosis not present

## 2024-04-21 DIAGNOSIS — M0579 Rheumatoid arthritis with rheumatoid factor of multiple sites without organ or systems involvement: Secondary | ICD-10-CM | POA: Diagnosis not present

## 2024-04-21 DIAGNOSIS — E559 Vitamin D deficiency, unspecified: Secondary | ICD-10-CM | POA: Diagnosis not present

## 2024-04-21 DIAGNOSIS — Z79899 Other long term (current) drug therapy: Secondary | ICD-10-CM | POA: Diagnosis not present

## 2024-04-21 DIAGNOSIS — M810 Age-related osteoporosis without current pathological fracture: Secondary | ICD-10-CM | POA: Diagnosis not present

## 2024-04-21 DIAGNOSIS — M109 Gout, unspecified: Secondary | ICD-10-CM | POA: Diagnosis not present

## 2024-04-21 DIAGNOSIS — L732 Hidradenitis suppurativa: Secondary | ICD-10-CM | POA: Diagnosis not present

## 2024-04-21 DIAGNOSIS — M5386 Other specified dorsopathies, lumbar region: Secondary | ICD-10-CM | POA: Diagnosis not present

## 2024-04-21 DIAGNOSIS — M1120 Other chondrocalcinosis, unspecified site: Secondary | ICD-10-CM | POA: Diagnosis not present

## 2024-04-21 DIAGNOSIS — L4050 Arthropathic psoriasis, unspecified: Secondary | ICD-10-CM | POA: Diagnosis not present

## 2024-04-28 ENCOUNTER — Telehealth: Payer: Self-pay | Admitting: Physical Medicine and Rehabilitation

## 2024-04-28 ENCOUNTER — Other Ambulatory Visit: Payer: Self-pay | Admitting: Physical Medicine and Rehabilitation

## 2024-04-28 DIAGNOSIS — M5416 Radiculopathy, lumbar region: Secondary | ICD-10-CM

## 2024-04-28 NOTE — Telephone Encounter (Signed)
Patient called. He would like an appointment with Dr. Newton.  

## 2024-04-30 DIAGNOSIS — M1990 Unspecified osteoarthritis, unspecified site: Secondary | ICD-10-CM | POA: Diagnosis not present

## 2024-04-30 DIAGNOSIS — M25562 Pain in left knee: Secondary | ICD-10-CM | POA: Diagnosis not present

## 2024-05-03 DIAGNOSIS — L732 Hidradenitis suppurativa: Secondary | ICD-10-CM | POA: Diagnosis not present

## 2024-05-17 ENCOUNTER — Encounter: Payer: Self-pay | Admitting: *Deleted

## 2024-05-18 NOTE — Discharge Instructions (Signed)

## 2024-05-19 ENCOUNTER — Inpatient Hospital Stay
Admission: RE | Admit: 2024-05-19 | Discharge: 2024-05-19 | Disposition: A | Source: Ambulatory Visit | Attending: Physical Medicine and Rehabilitation | Admitting: Physical Medicine and Rehabilitation

## 2024-06-08 ENCOUNTER — Inpatient Hospital Stay: Attending: Internal Medicine

## 2024-06-08 DIAGNOSIS — N183 Chronic kidney disease, stage 3 unspecified: Secondary | ICD-10-CM

## 2024-06-08 DIAGNOSIS — D72829 Elevated white blood cell count, unspecified: Secondary | ICD-10-CM

## 2024-06-08 LAB — CBC WITH DIFFERENTIAL/PLATELET
Abs Immature Granulocytes: 0.17 K/uL — ABNORMAL HIGH (ref 0.00–0.07)
Basophils Absolute: 0.1 K/uL (ref 0.0–0.1)
Basophils Relative: 1 %
Eosinophils Absolute: 0.2 K/uL (ref 0.0–0.5)
Eosinophils Relative: 1 %
HCT: 31.1 % — ABNORMAL LOW (ref 39.0–52.0)
Hemoglobin: 9.9 g/dL — ABNORMAL LOW (ref 13.0–17.0)
Immature Granulocytes: 1 %
Lymphocytes Relative: 14 %
Lymphs Abs: 2.2 K/uL (ref 0.7–4.0)
MCH: 27.1 pg (ref 26.0–34.0)
MCHC: 31.8 g/dL (ref 30.0–36.0)
MCV: 85.2 fL (ref 80.0–100.0)
Monocytes Absolute: 1.1 K/uL — ABNORMAL HIGH (ref 0.1–1.0)
Monocytes Relative: 7 %
Neutro Abs: 12.6 K/uL — ABNORMAL HIGH (ref 1.7–7.7)
Neutrophils Relative %: 76 %
Platelets: 329 K/uL (ref 150–400)
RBC: 3.65 MIL/uL — ABNORMAL LOW (ref 4.22–5.81)
RDW: 15.2 % (ref 11.5–15.5)
WBC: 16.4 K/uL — ABNORMAL HIGH (ref 4.0–10.5)
nRBC: 0 % (ref 0.0–0.2)

## 2024-06-08 LAB — COMPREHENSIVE METABOLIC PANEL WITH GFR
ALT: 17 U/L (ref 0–44)
AST: 24 U/L (ref 15–41)
Albumin: 4.1 g/dL (ref 3.5–5.0)
Alkaline Phosphatase: 57 U/L (ref 38–126)
Anion gap: 13 (ref 5–15)
BUN: 22 mg/dL (ref 8–23)
CO2: 25 mmol/L (ref 22–32)
Calcium: 9.2 mg/dL (ref 8.9–10.3)
Chloride: 103 mmol/L (ref 98–111)
Creatinine, Ser: 1.87 mg/dL — ABNORMAL HIGH (ref 0.61–1.24)
GFR, Estimated: 38 mL/min — ABNORMAL LOW
Glucose, Bld: 153 mg/dL — ABNORMAL HIGH (ref 70–99)
Potassium: 4.2 mmol/L (ref 3.5–5.1)
Sodium: 141 mmol/L (ref 135–145)
Total Bilirubin: 0.2 mg/dL (ref 0.0–1.2)
Total Protein: 7.2 g/dL (ref 6.5–8.1)

## 2024-06-08 LAB — IRON AND TIBC
Iron: 28 ug/dL — ABNORMAL LOW (ref 45–182)
Saturation Ratios: 13 % — ABNORMAL LOW (ref 17.9–39.5)
TIBC: 223 ug/dL — ABNORMAL LOW (ref 250–450)
UIBC: 195 ug/dL

## 2024-06-08 LAB — LACTATE DEHYDROGENASE: LDH: 162 U/L (ref 105–235)

## 2024-06-08 LAB — FERRITIN: Ferritin: 567 ng/mL — ABNORMAL HIGH (ref 24–336)

## 2024-06-21 NOTE — Progress Notes (Unsigned)
 "  [facility] [address], [place] [address]   CLINIC:  Medical Oncology/Hematology  PCP:  Verdia Lombard, MD [address] [place] [address] [phone]   REASON FOR VISIT:  Follow-up for leukocytosis, anemia, unintentional weight loss.     INTERVAL HISTORY:   Mr. Kevin Lopez 74 y.o. male returns for routine follow-up of leukocytosis, anemia, and unintentional weight loss.  He was last seen by Pleasant Barefoot PA-C on 06/18/2023.  He was originally supposed to be seen at 72-month interval in July 2025, but had to cancel and rescheduled this visit several times.   At today's visit, he reports feeling poorly due to ongoing pain, fatigue, and difficulty sleeping. He denies any interim hospitalizations, surgeries, or changes in baseline health status. He has 25% energy and 75% appetite. He reports a stable weight.   He continues to deny any B symptoms, lymphadenopathy, or masses. He receives intermittent steroids for his rheumatoid arthritis, most recently with lumbar epidural steroid injection in August 2025.  He is on Rinvoq for RA.  No bright red blood per rectum, melena, or epistaxis.    Chronic fatigue is somewhat worsened, and he reports feeling cold all the time.   No pica.   He denies any chest pain or difficulty breathing.  He is taking iron tablets and B12 supplements at home.   He has not had any recent iron infusions via our office or his nephrologist.   Not taking any EPO injections.   ASSESSMENT & PLAN:  1.  Leukocytosis and thrombocytosis - Flow cytometry on 05/12/2019 did not show any monoclonal B-cell population.  Predominance of lymphocytes and nonspecific changes. - JAK2 V617F and reflex testing was negative.  BCR/ABL by FISH was negative.  He has had elevated ESR, but normal CRP. - Patient has rheumatoid arthritis, follows with rheumatology.   - Receives intermittent steroids and injections, most recently with  lumbar epidural steroid injection in August 2025.   - He denies any B symptoms.   - No masses or lymphadenopathy on exam.   - Most recent CBC (06/08/2024): WBC 16.4, ANC 12.6, monocytes 1.1.  Normal platelets 329.  Normal LDH.  - DIFFERENTIAL DIAGNOSIS favors reactive leukocytosis in the setting of rheumatoid arthritis, chronic inflammation, and frequent steroids. - PLAN: Continue active surveillance - Although previous flow cytometry (2020) was unremarkable, we will consider repeat flow cytometry if he has persistent or worsening lymphocytosis   2.  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 - Stool occult negative when checked in 2021.  Hemoccult negative x3 (August 2023) - SPEP and immunofixation negative (2023) - Normal B12, MMA, copper , folate (2023) - Normal LDH and reticulocytes (2023) - Denies any bleeding per rectum or melena - Previously received IV iron in outpatient setting per nephrologist, none recently - CMP consistent with CKD stage IIIa/b - Most recent labs (06/08/2024): Hgb 9.9/MCV 85.2, ferritin 567, iron saturation 13%. Creatinine 1.87/GFR 38. - DIFFERENTIAL DIAGNOSIS favors anemia secondary to CKD, functional iron deficiency, and chronic inflammatory disease - NOTE that Rinvoq can also cause anemia and <10% of patients. - PLAN: Hgb has trended downward over the past year.  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 - Continue B12 and iron tablets every other day. - I would like to check his  labs again in 3 months and check repeat nutritional panel and MGUS/myeloma screening at that time.  If these labs are negative but he continues to have Hgb <10.0, we would start him on Retacrit therapy at that time.     3.  Weight loss, RESOLVED - Unexpectedly lost about  10 pounds around 2021-2023 and was concerned by inability to regain the weight he had lost  - He reports colonoscopy last year at outside hospital (records not available in EMR).  He reports more frequent diarrhea lately, but otherwise denies any changes in bowel and bladder habits.  He does have some difficulty swallowing, reports that he had an esophageal stricture stretched last year, but is having recurrent symptoms again.  Over the past month, he has had intermittent epigastric pain that is worse in the morning, relieved with eating and burping.  He denies any changes in his breathing patterns. - He has history of prostate cancer s/p partial prostatectomy in early 2000's. - Normal PSA (1.74) on 12/21/2021.  Normal TSH and T4. - CT abdomen/pelvis (02/03/2022): Small umbilical hernia without any associated findings to suggest bowel obstruction, no evidence of malignant process, other incidental findings discussed in radiology report - Weight today is 160 pounds - stable for the past 12 months  4.  Rheumatoid arthritis - He has been on multiple immunosuppressive medications. - Was on Orencia  until July 2023.  Previously took Humira. - Currently receiving Rinvoq.   PLAN SUMMARY:  >> Labs in 3 months = CBC/D, CMP, ferritin, iron/TIBC, B12, MMA, folate, copper , SPEP, kappa/lambda light chains, immunofixation >> OFFICE visit in 3 months, 1 week after labs      REVIEW OF SYSTEMS:   Review of Systems  Constitutional:  Positive for fatigue. Negative for appetite change, chills, diaphoresis, fever and unexpected weight change.  HENT:   Negative for lump/mass and nosebleeds.   Eyes:  Negative for eye problems.  Respiratory:  Negative for cough, hemoptysis and shortness of breath.   Cardiovascular:  Negative for chest pain, leg swelling and palpitations.  Gastrointestinal:  Negative for abdominal pain (chest tightening and indigestion when he eats), blood in stool, constipation, diarrhea, nausea and  vomiting.  Genitourinary:  Negative for hematuria.   Musculoskeletal:  Positive for arthralgias.  Skin: Negative.   Neurological:  Positive for headaches. Negative for dizziness and light-headedness.  Hematological:  Does not bruise/bleed easily.  Psychiatric/Behavioral:  Positive for sleep disturbance. The patient is nervous/anxious.      PHYSICAL EXAM:  ECOG PERFORMANCE STATUS: 1 - Symptomatic but completely ambulatory  Vitals:   06/22/24 1404 06/22/24 1406  BP: (!) 150/84 130/69  Pulse: 68   Resp: 18   Temp: 99 F (37.2 C)   SpO2: 98%     Filed Weights   06/22/24 1404  Weight: 160 lb (72.6 kg)    Physical Exam Constitutional:      Appearance: Normal appearance. He is normal weight.  Cardiovascular:     Heart sounds: Normal heart sounds.  Pulmonary:     Breath sounds: Normal breath sounds.  Musculoskeletal:     Comments: Nodules and contractures of bilateral hands secondary to rheumatoid arthritis  Neurological:     General: No focal deficit present.     Mental Status: Mental status is at baseline.  Psychiatric:        Behavior: Behavior normal. Behavior is cooperative.     PAST MEDICAL/SURGICAL HISTORY:  Past Medical History:  Diagnosis Date   Chronic kidney disease  Diabetes mellitus without complication (HCC)    Diverticulosis    Hyperlipidemia    Hypertension    RA (rheumatoid arthritis) (HCC)    SBO (small bowel obstruction) s/p ex lap in past 12/15/2015   Past Surgical History:  Procedure Laterality Date   APPENDECTOMY     [place]Y TAKEDOWN     [place]   FLEXIBLE SIGMOIDOSCOPY N/A 12/15/2015   Procedure: FLEXIBLE SIGMOIDOSCOPY;  Surgeon: Margo LITTIE Haddock, MD;  Location: [facility];  Service: Endoscopy;  Laterality: N/A;   HIP ARTHROPLASTY Left 12/06/2021   Procedure: LEFT HIP ARTHROSCOPY IRRIGATION AND DEBRIDEMENT.;  Surgeon: Genelle Standing, MD;  Location: [facility] OR;  Service: Orthopedics;  Laterality: Left;   KNEE SURGERY Right    LEFT COLECTOMY      [place]   LYSIS OF ADHESION     SBO    SOCIAL HISTORY:  Social History   Socioeconomic History   Marital status: Divorced    Spouse name: Not on file   Number of children: 3   Years of education: Not on file   Highest education level: Not on file  Occupational History   Occupation: retired  Tobacco Use   Smoking status: Never   Smokeless tobacco: Never  Vaping Use   Vaping status: Never Used  Substance and Sexual Activity   Alcohol  use: Yes    Comment: occ.    Drug use: No   Sexual activity: Not on file  Other Topics Concern   Not on file  Social History Narrative   Not on file   Social Drivers of Health   Tobacco Use: Low Risk (06/11/2023)   Patient History    Smoking Tobacco Use: Never    Smokeless Tobacco Use: Never    Passive Exposure: Not on file  Financial Resource Strain: Not on file  Food Insecurity: Not on file  Transportation Needs: Not on file  Physical Activity: Not on file  Stress: Not on file  Social Connections: Not on file  Intimate Partner Violence: Not At Risk (09/29/2022)   Received from [facility]   Intimate Partner Violence    In the past 12 months have you been in a relationship with a person who hurts, threatens, or tries to control you?: No    Are you denied basic needs such as food, clothing, or medical care?: No    In the past 12 months have you been in a relationship with a person who hurts, threatens, or tries to control you?: No    Are you denied basic needs such as food, clothing, or medical care?: No  Depression (PHQ2-9): Low Risk (06/22/2024)   Depression (PHQ2-9)    PHQ-2 Score: 0  Alcohol  Screen: Not on file  Housing: Not on file  Utilities: Not on file  Health Literacy: Not on file    FAMILY HISTORY:  Family History  Problem Relation Age of Onset   Cancer Mother    Heart attack Father    Hypertension Sister    Hypertension Brother     CURRENT MEDICATIONS:  Outpatient Encounter Medications as of 06/22/2024   Medication Sig   amLODipine  (NORVASC ) 10 MG tablet Take 10 mg by mouth daily.   ascorbic acid  (VITAMIN C) 500 MG tablet Take 500 mg by mouth daily.   b complex vitamins capsule Take 1 capsule by mouth daily.   carvedilol  (COREG ) 12.5 MG tablet Take 12.5 mg by mouth 2 (two) times daily with a meal.   cholecalciferol (VITAMIN D3) 25 MCG (  1000 UNIT) tablet Take 1,000 Units by mouth daily.   clonazePAM  (KLONOPIN ) 0.5 MG tablet Take 0.5 mg by mouth at bedtime.   Cyanocobalamin  (B-12 PO) Take 1 capsule by mouth daily.   dapagliflozin propanediol (FARXIGA) 10 MG TABS tablet Take 10 mg by mouth daily.   Ferrous Sulfate (IRON PO) Take 1 capsule by mouth daily.   hydrALAZINE  (APRESOLINE ) 25 MG tablet Take 25 mg by mouth 3 (three) times daily.   hydrOXYzine  (ATARAX /VISTARIL ) 25 MG tablet Take 25 mg by mouth daily as needed for anxiety.   linagliptin (TRADJENTA) 5 MG TABS tablet Take 5 mg by mouth daily.   lovastatin (MEVACOR) 40 MG tablet TAKE 1 TABLET BY MOUTH DAILY WITH THE EVENING MEAL   Multiple Vitamin (MULTIVITAMIN WITH MINERALS) TABS tablet Take 1 tablet by mouth daily.   traZODone (DESYREL) 50 MG tablet Take 50-100 mg by mouth at bedtime as needed.   No facility-administered encounter medications on file as of 06/22/2024.    ALLERGIES:  Allergies  Allergen Reactions   Latex Shortness Of Breath    Other Reaction(s): Not available  latex   Cephalexin Hives and Dermatitis   Irbesartan Hives and Dermatitis    swelling   Metformin Diarrhea, Nausea And Vomiting, Swelling and Nausea Only    LABORATORY DATA:  I have reviewed the labs as listed.  CBC    Component Value Date/Time   WBC 16.4 (H) 06/08/2024 1349   RBC 3.65 (L) 06/08/2024 1349   HGB 9.9 (L) 06/08/2024 1349   HGB 10.2 (L) 07/31/2021 1426   HCT 31.1 (L) 06/08/2024 1349   HCT 31.8 (L) 07/31/2021 1426   PLT 329 06/08/2024 1349   MCV 85.2 06/08/2024 1349   MCH 27.1 06/08/2024 1349   MCHC 31.8 06/08/2024 1349   RDW 15.2  06/08/2024 1349   LYMPHSABS 2.2 06/08/2024 1349   MONOABS 1.1 (H) 06/08/2024 1349   EOSABS 0.2 06/08/2024 1349   BASOSABS 0.1 06/08/2024 1349      Latest Ref Rng & Units 06/08/2024    1:49 PM 06/11/2023    3:21 PM 12/03/2022    1:24 PM  CMP  Glucose 70 - 99 mg/dL 846  859  860   BUN 8 - 23 mg/dL 22  29  21    Creatinine 0.61 - 1.24 mg/dL 8.12  8.41  8.34   Sodium 135 - 145 mmol/L 141  138  138   Potassium 3.5 - 5.1 mmol/L 4.2  4.2  4.6   Chloride 98 - 111 mmol/L 103  105  107   CO2 22 - 32 mmol/L 25  23  25    Calcium 8.9 - 10.3 mg/dL 9.2  9.3  9.2   Total Protein 6.5 - 8.1 g/dL 7.2  7.3  7.4   Total Bilirubin 0.0 - 1.2 mg/dL 0.2  0.5  0.5   Alkaline Phos 38 - 126 U/L 57  54  51   AST 15 - 41 U/L 24  30  19    ALT 0 - 44 U/L 17  29  17      DIAGNOSTIC IMAGING:  I have independently reviewed the relevant imaging and discussed with the patient.   WRAP UP:  All questions were answered. The patient knows to call the clinic with any problems, questions or concerns.  Medical decision making: Moderate  Time spent on visit: I spent 20 minutes counseling the patient face to face. The total time spent in the appointment was 30 minutes and more than 50%  was on counseling.  Pleasant CHRISTELLA Barefoot, PA-C  06/22/24 2:40 PM  "

## 2024-06-22 ENCOUNTER — Inpatient Hospital Stay: Attending: Internal Medicine | Admitting: Physician Assistant

## 2024-06-22 VITALS — BP 130/69 | HR 68 | Temp 99.0°F | Resp 18 | Ht 69.0 in | Wt 160.0 lb

## 2024-06-22 DIAGNOSIS — D631 Anemia in chronic kidney disease: Secondary | ICD-10-CM | POA: Diagnosis not present

## 2024-06-22 DIAGNOSIS — N183 Chronic kidney disease, stage 3 unspecified: Secondary | ICD-10-CM | POA: Diagnosis not present

## 2024-06-22 DIAGNOSIS — D72829 Elevated white blood cell count, unspecified: Secondary | ICD-10-CM

## 2024-06-22 NOTE — Patient Instructions (Addendum)
 [facility] at [facility] **VISIT SUMMARY & IMPORTANT INSTRUCTIONS **    You were seen today by Pleasant Barefoot PA-C for your anemia and elevated white blood cells.     ANEMIA: This is related to your chronic kidney disease and chronic inflammation from rheumatoid arthritis.  Your rheumatoid arthritis medication (Rinvoq) can also cause some anemia and less than 10% of patients - please speak with your rheumatologist about whether or not you should continue Rinvoq. You do not need any treatment of your anemia at this time, but we will recheck your labs in 3 months.  If you continue to have hemoglobin <10.0 without other cause, we would start you on Retacrit injections at that time. Continue to take iron and B12 tablets every other day.  (Although your serum iron is low, your storage iron/ferritin is elevated - this is a result of your chronic inflammation.      LEUKOCYTOSIS: Your white blood cells are elevated due to chronic inflammation and intermittent steroid medications.  You do not need any additional testing or treatment for elevated white blood cells at this time.   LABS: Return in 3 months for repeat labs   FOLLOW-UP APPOINTMENT: Office visit in 3 months  ** Thank you for trusting me with your healthcare!  I strive to provide all of my patients with quality care at each visit.  If you receive a survey for this visit, I would be so grateful to you for taking the time to provide feedback.  Thank you in advance!  ~ Ky Moskowitz                                        Dr. Mickiel Davonna Pleasant Barefoot, PA-C          Delon Hope, NP   - - - - - - - - - - - - - - - - - -     Thank you for choosing [facility] at [facility] to provide your oncology and hematology care.  To afford each patient quality time with our provider, please arrive at least 15 minutes before your scheduled appointment time.   If you have a lab appointment with  the Cancer Center please come in thru the Main Entrance and check in at the main information desk.  You need to re-schedule your appointment should you arrive 10 or more minutes late.  We strive to give you quality time with our providers, and arriving late affects you and other patients whose appointments are after yours.  Also, if you no show three or more times for appointments you may be dismissed from the clinic at the providers discretion.     Again, thank you for choosing [facility].  Our hope is that these requests will decrease the amount of time that you wait before being seen by our physicians.       _____________________________________________________________  Should you have questions after your visit to [facility], please contact our office at [phone] and follow the prompts.  Our office hours are 8:00 a.m. and 4:30 p.m. Monday - Friday.  Please note that voicemails left after 4:00 p.m. may not be returned until the following business day.  We are closed weekends and major holidays.  You do have access to a nurse  24-7, just call the main number to the clinic [phone] and do not press any options, hold on the line and a nurse will answer the phone.    For prescription refill requests, have your pharmacy contact our office and allow 72 hours.

## 2024-09-20 ENCOUNTER — Inpatient Hospital Stay

## 2024-09-27 ENCOUNTER — Inpatient Hospital Stay: Admitting: Physician Assistant
